# Patient Record
Sex: Male | Born: 1945 | Race: White | Hispanic: No | Marital: Married | Smoking: Never smoker
Health system: Southern US, Community
[De-identification: ages and names within clinical notes are randomized; demographics above are authoritative.]

## PROBLEM LIST (undated history)

## (undated) DIAGNOSIS — E876 Hypokalemia: Secondary | ICD-10-CM

## (undated) DIAGNOSIS — B079 Viral wart, unspecified: Secondary | ICD-10-CM

## (undated) DIAGNOSIS — G4730 Sleep apnea, unspecified: Secondary | ICD-10-CM

## (undated) DIAGNOSIS — E7800 Pure hypercholesterolemia, unspecified: Secondary | ICD-10-CM

## (undated) DIAGNOSIS — E669 Obesity, unspecified: Secondary | ICD-10-CM

## (undated) DIAGNOSIS — Z8619 Personal history of other infectious and parasitic diseases: Secondary | ICD-10-CM

## (undated) DIAGNOSIS — I82409 Acute embolism and thrombosis of unspecified deep veins of unspecified lower extremity: Secondary | ICD-10-CM

## (undated) DIAGNOSIS — M549 Dorsalgia, unspecified: Secondary | ICD-10-CM

## (undated) DIAGNOSIS — I10 Essential (primary) hypertension: Secondary | ICD-10-CM

## (undated) DIAGNOSIS — E785 Hyperlipidemia, unspecified: Secondary | ICD-10-CM

## (undated) HISTORY — DX: Viral wart, unspecified: B07.9

## (undated) HISTORY — PX: EYE SURGERY: SHX253

## (undated) HISTORY — DX: Acute embolism and thrombosis of unspecified deep veins of unspecified lower extremity: I82.409

## (undated) HISTORY — DX: Sleep apnea, unspecified: G47.30

## (undated) HISTORY — DX: Essential (primary) hypertension: I10

## (undated) HISTORY — DX: Obesity, unspecified: E66.9

## (undated) HISTORY — DX: Dorsalgia, unspecified: M54.9

## (undated) HISTORY — DX: Personal history of other infectious and parasitic diseases: Z86.19

## (undated) HISTORY — DX: Hyperlipidemia, unspecified: E78.5

## (undated) HISTORY — DX: Hypokalemia: E87.6

## (undated) HISTORY — DX: Pure hypercholesterolemia, unspecified: E78.00

---

## 2000-01-02 ENCOUNTER — Encounter: Admission: RE | Admit: 2000-01-02 | Discharge: 2000-01-02 | Payer: Self-pay | Admitting: Family Medicine

## 2000-03-29 ENCOUNTER — Ambulatory Visit (HOSPITAL_BASED_OUTPATIENT_CLINIC_OR_DEPARTMENT_OTHER): Admission: RE | Admit: 2000-03-29 | Discharge: 2000-03-29 | Payer: Self-pay | Admitting: *Deleted

## 2003-06-29 ENCOUNTER — Encounter (INDEPENDENT_AMBULATORY_CARE_PROVIDER_SITE_OTHER): Payer: Self-pay | Admitting: Cardiology

## 2003-06-29 ENCOUNTER — Ambulatory Visit (HOSPITAL_COMMUNITY): Admission: RE | Admit: 2003-06-29 | Discharge: 2003-06-29 | Payer: Self-pay | Admitting: Cardiology

## 2006-01-12 LAB — HM COLONOSCOPY

## 2006-02-06 ENCOUNTER — Ambulatory Visit: Payer: Self-pay | Admitting: Pulmonary Disease

## 2008-08-29 ENCOUNTER — Encounter: Admission: RE | Admit: 2008-08-29 | Discharge: 2008-09-20 | Payer: Self-pay | Admitting: Family Medicine

## 2009-01-18 ENCOUNTER — Emergency Department (HOSPITAL_COMMUNITY): Admission: EM | Admit: 2009-01-18 | Discharge: 2009-01-18 | Payer: Self-pay | Admitting: Emergency Medicine

## 2009-05-30 ENCOUNTER — Emergency Department (HOSPITAL_BASED_OUTPATIENT_CLINIC_OR_DEPARTMENT_OTHER): Admission: EM | Admit: 2009-05-30 | Discharge: 2009-05-30 | Payer: Self-pay | Admitting: Emergency Medicine

## 2010-08-17 LAB — COMPREHENSIVE METABOLIC PANEL
ALT: 30 U/L (ref 0–53)
AST: 30 U/L (ref 0–37)
Albumin: 4.5 g/dL (ref 3.5–5.2)
Alkaline Phosphatase: 66 U/L (ref 39–117)
BUN: 12 mg/dL (ref 6–23)
CO2: 27 mEq/L (ref 19–32)
Calcium: 9.8 mg/dL (ref 8.4–10.5)
Chloride: 104 mEq/L (ref 96–112)
Creatinine, Ser: 0.96 mg/dL (ref 0.4–1.5)
GFR calc Af Amer: >60 mL/min (ref >60)
GFR calc non Af Amer: >60 mL/min (ref >60)
Glucose, Bld: 95 mg/dL (ref 70–99)
Potassium: 3.6 mEq/L (ref 3.5–5.1)
Sodium: 141 mEq/L (ref 135–145)
Total Bilirubin: 0.7 mg/dL (ref 0.3–1.2)
Total Protein: 7.9 g/dL (ref 6.0–8.3)

## 2010-08-17 LAB — DIFFERENTIAL
Basophils Absolute: 0 10*3/uL (ref 0.0–0.1)
Basophils Relative: 1 % (ref 0–1)
Eosinophils Absolute: 0.3 10*3/uL (ref 0.0–0.7)
Eosinophils Relative: 4 % (ref 0–5)
Lymphocytes Relative: 26 % (ref 12–46)
Lymphs Abs: 1.8 10*3/uL (ref 0.7–4.0)
Monocytes Absolute: 0.7 10*3/uL (ref 0.1–1.0)
Monocytes Relative: 11 % (ref 3–12)
Neutro Abs: 4.2 10*3/uL (ref 1.7–7.7)
Neutrophils Relative %: 60 % (ref 43–77)

## 2010-08-17 LAB — CBC
HCT: 43.1 % (ref 39.0–52.0)
Hemoglobin: 14.6 g/dL (ref 13.0–17.0)
MCHC: 34 g/dL (ref 30.0–36.0)
MCV: 93.7 fL (ref 78.0–100.0)
Platelets: 224 10*3/uL (ref 150–400)
RBC: 4.6 MIL/uL (ref 4.22–5.81)
RDW: 13.7 % (ref 11.5–15.5)
WBC: 7 10*3/uL (ref 4.0–10.5)

## 2010-08-17 LAB — CARBOXYHEMOGLOBIN
Carboxyhemoglobin: 1 % (ref 0.5–1.5)
Methemoglobin: 0.4 % (ref 0.0–1.5)
O2 Saturation: 70.3 %
Total hemoglobin: 14.7 g/dL (ref 13.5–18.0)

## 2013-04-19 DIAGNOSIS — I10 Essential (primary) hypertension: Secondary | ICD-10-CM | POA: Insufficient documentation

## 2014-06-09 ENCOUNTER — Telehealth: Payer: Self-pay | Admitting: Pulmonary Disease

## 2014-06-09 NOTE — Telephone Encounter (Signed)
Pt needing new CPAP machine. Current machine seems to be functioning okay just very noisy.Former patient of Dr Gwenette Greet from 2007. Pt requested sooner appt than 06/28/14 appt that [facility] had available. Pt was okay with switching MD's. Scheduled with CDY 06/10/14 at 10:30 for sleep consult. Pt aware to arrive 15-16mns early for appt to fill out paperwork. Nothing further needed.

## 2014-06-10 ENCOUNTER — Ambulatory Visit (INDEPENDENT_AMBULATORY_CARE_PROVIDER_SITE_OTHER): Payer: Medicare Other | Admitting: Internal Medicine

## 2014-06-10 ENCOUNTER — Encounter: Payer: Self-pay | Admitting: Internal Medicine

## 2014-06-10 VITALS — BP 154/82 | HR 75 | Ht 69.0 in | Wt 235.2 lb

## 2014-06-10 DIAGNOSIS — G4733 Obstructive sleep apnea (adult) (pediatric): Secondary | ICD-10-CM

## 2014-06-10 NOTE — Progress Notes (Signed)
Pt needing new CPAP machine. Current machine seems to be functioning okay just very noisy.Former patient of Dr Gwenette Greet from 2007. Pt requested sooner appt than 06/28/14 appt that [facility] had available. Pt was okay with switching MD's. Scheduled with CDY 06/10/14 at 10:30 for sleep consult. Pt aware to arrive 15-36mns early for appt to fill out paperwork. Nothing further needed.  06/10/14- 89yoM FOLLOW FOR:  former patient of Dr. CGwenette Greet needs new CPAP equipment; Epworth Score: 3 He has been using this all night, every night at 12 cwp. No snore and good control of sleep quality.  We are seeking his diagnostic sleep study report. He had been using an online source for CPAP supplies but wants to switch back to [facility]. Bedtime around 9:30 PM, short sleep latency, waking once or twice before up at 7 AM. Weight stable. Nasal polypectomy years ago, no other ENT surgery. Medical problems with hypertension but no heart or lung disease. Never smoked.  Prior to Admission medications   Medication Sig Start Date End Date Taking? Authorizing Provider  aspirin 81 MG tablet Take 81 mg by mouth daily.   Yes Historical Provider, MD  hydrochlorothiazide (HYDRODIURIL) 25 MG tablet  04/13/14  Yes Historical Provider, MD  losartan (COZAAR) 50 MG tablet  04/13/14  Yes Historical Provider, MD  pravastatin (PRAVACHOL) 40 MG tablet  04/13/14  Yes Historical Provider, MD   Past Medical History  Diagnosis Date  . High blood pressure   . High cholesterol    No past surgical history on file. Family History  Problem Relation Age of Onset  . Stroke Father     from surgery  . Emphysema Father     smoker   History   Social History  . Marital Status: Married    Spouse Name: N/A    Number of Children: 1  . Years of Education: N/A   Occupational History  . Retired    Social History Main Topics  . Smoking status: Never Smoker   . Smokeless tobacco: Not on file  . Alcohol Use: 0.0 oz/week    0 Not specified per  week     Comment: 2-3 glasses of wine every night  . Drug Use: No  . Sexual Activity: Yes   Other Topics Concern  . Not on file   Social History Narrative  . No narrative on file   ROS-see HPI Constitutional:   No-   weight loss, night sweats, fevers, chills, fatigue, lassitude. HEENT:   No-  headaches, difficulty swallowing, tooth/dental problems, sore throat,       No-  sneezing, itching, ear ache, nasal congestion, post nasal drip,  CV:  No-   chest pain, orthopnea, PND, swelling in lower extremities, anasarca,                                  dizziness, palpitations Resp: No-   shortness of breath with exertion or at rest.              No-   productive cough,  No non-productive cough,  No- coughing up of blood.              No-   change in color of mucus.  No- wheezing.   Skin: No-   rash or lesions. GI:  No-   heartburn, indigestion, abdominal pain, nausea, vomiting, diarrhea,  change in bowel habits, loss of appetite GU: No-   dysuria, change in color of urine, no urgency or frequency.  No- flank pain. MS:  No-   joint pain or swelling.  No- decreased range of motion.  No- back pain. Neuro-     nothing unusual Psych:  No- change in mood or affect. No depression or anxiety.  No memory loss.  OBJ- Physical Exam General- Alert, Oriented, Affect-appropriate, Distress- none acute, overweight Skin- rash-none, lesions- none, excoriation- none Lymphadenopathy- none Head- atraumatic            Eyes- Gross vision intact, PERRLA, conjunctivae and secretions clear            Ears- Hearing, canals-normal            Nose- Clear, no-Septal dev, mucus, polyps, erosion, perforation             Throat- Mallampati IV , mucosa clear , drainage- none, tonsils- atrophic Neck- flexible , trachea midline, no stridor , thyroid nl, carotid no bruit Chest - symmetrical excursion , unlabored           Heart/CV- RRR , no murmur , no gallop  , no rub, nl s1 s2                            - JVD- none , edema- none, stasis changes- none, varices- none           Lung- clear to P&A, wheeze- none, cough- none , dullness-none, rub- none           Chest wall-  Abd- tender-no, distended-no, bowel sounds-present, HSM- no Br/ Gen/ Rectal- Not done, not indicated Extrem- cyanosis- none, clubbing, none, atrophy- none, strength- nl Neuro- grossly intact to observation

## 2014-06-10 NOTE — Patient Instructions (Addendum)
Order- DME [facility] replacement CPAP machine of choice, 12 cwp, mask of choice, humidifier, supplies   Dx OSA  Seek original diagnostic sleep study documentation

## 2014-06-10 NOTE — Assessment & Plan Note (Signed)
He has had excellent compliance and control with CPAP set at 12 using a nasal mask and he describes prevention of snoring and of daytime sleepiness. His old machine is wearing out. We reviewed basic sleep hygiene and CPAP use. It is appropriate to replace his CPAP machine. I don't think he needs a pressure change. Plan-reestablish with [facility] for replacement CPAP machine set at 12, mask of choice, humidifier and supplies.

## 2014-06-17 ENCOUNTER — Telehealth: Payer: Self-pay | Admitting: Internal Medicine

## 2014-06-17 NOTE — Telephone Encounter (Signed)
I was able to get his paper chart the next day after his OV and Melissa with [facility] took a copy of this with her.

## 2014-06-17 NOTE — Telephone Encounter (Signed)
Spoke with pt, he is aware of this.  Nothing further needed.

## 2014-06-17 NOTE — Telephone Encounter (Signed)
Spoke with pt, wants to make sure we have requested his archived sleep study and sent it to [facility].  I do not see documentation of this in his chart.  Katie please advise on the status of this.  Thanks!!

## 2014-07-01 ENCOUNTER — Encounter: Payer: Self-pay | Admitting: Internal Medicine

## 2014-08-09 ENCOUNTER — Encounter: Payer: Self-pay | Admitting: Internal Medicine

## 2014-08-09 ENCOUNTER — Ambulatory Visit: Payer: Medicare Other | Admitting: Internal Medicine

## 2014-08-09 VITALS — BP 126/80 | HR 74 | Ht 69.0 in | Wt 239.6 lb

## 2014-08-09 DIAGNOSIS — G4733 Obstructive sleep apnea (adult) (pediatric): Secondary | ICD-10-CM

## 2014-08-09 NOTE — Patient Instructions (Signed)
We can continue CPAP 12/ [facility]. If there are ongoing concerns with equipment you can discuss directly with the people at [facility].  Please call us as needed

## 2014-08-09 NOTE — Progress Notes (Signed)
Pt needing new CPAP machine. Current machine seems to be functioning okay just very noisy.Former patient of Dr Gwenette Greet from 2007. Pt requested sooner appt than 06/28/14 appt that [facility] had available. Pt was okay with switching MD's. Scheduled with CDY 06/10/14 at 10:30 for sleep consult. Pt aware to arrive 15-20mns early for appt to fill out paperwork. Nothing further needed.  06/10/14- 89yoM FOLLOW FOR:  former patient of Dr. CGwenette Greet needs new CPAP equipment; Epworth Score: 3 He has been using this all night, every night at 12 cwp. No snore and good control of sleep quality.  We are seeking his diagnostic sleep study report. He had been using an online source for CPAP supplies but wants to switch back to [facility]. Bedtime around 9:30 PM, short sleep latency, waking once or twice before up at 7 AM. Weight stable. Nasal polypectomy years ago, no other ENT surgery. Medical problems with hypertension but no heart or lung disease. Never smoked.  08/09/14- 89yoM FOLLOW FOR:  former patient of Dr. CGwenette Greet needs new CPAP equipment; Epworth Score: 3 FOLLOWS FOR: DME is [facility]; wears CPAP   / [facility] every night for about 8-9 hours. [facility] to last OV notes.   ROS-see HPI Constitutional:   No-   weight loss, night sweats, fevers, chills, fatigue, lassitude. HEENT:   No-  headaches, difficulty swallowing, tooth/dental problems, sore throat,       No-  sneezing, itching, ear ache, nasal congestion, post nasal drip,  CV:  No-   chest pain, orthopnea, PND, swelling in lower extremities, anasarca,                                  dizziness, palpitations Resp: No-   shortness of breath with exertion or at rest.              No-   productive cough,  No non-productive cough,  No- coughing up of blood.              No-   change in color of mucus.  No- wheezing.   Skin: No-   rash or lesions. GI:  No-   heartburn, indigestion, abdominal pain, nausea, vomiting, diarrhea,                 change  in bowel habits, loss of appetite GU: No-   dysuria, change in color of urine, no urgency or frequency.  No- flank pain. MS:  No-   joint pain or swelling.  No- decreased range of motion.  No- back pain. Neuro-     nothing unusual Psych:  No- change in mood or affect. No depression or anxiety.  No memory loss.  OBJ- Physical Exam General- Alert, Oriented, Affect-appropriate, Distress- none acute, overweight Skin- rash-none, lesions- none, excoriation- none Lymphadenopathy- none Head- atraumatic            Eyes- Gross vision intact, PERRLA, conjunctivae and secretions clear            Ears- Hearing, canals-normal            Nose- Clear, no-Septal dev, mucus, polyps, erosion, perforation             Throat- Mallampati IV , mucosa clear , drainage- none, tonsils- atrophic Neck- flexible , trachea midline, no stridor , thyroid nl, carotid no bruit Chest - symmetrical excursion , unlabored  Heart/CV- RRR , no murmur , no gallop  , no rub, nl s1 s2                           - JVD- none , edema- none, stasis changes- none, varices- none           Lung- clear to P&A, wheeze- none, cough- none , dullness-none, rub- none           Chest wall-  Abd- tender-no, distended-no, bowel sounds-present, HSM- no Br/ Gen/ Rectal- Not done, not indicated Extrem- cyanosis- none, clubbing, none, atrophy- none, strength- nl Neuro- grossly intact to observation

## 2014-09-06 ENCOUNTER — Encounter: Payer: Self-pay | Admitting: Internal Medicine

## 2014-12-21 ENCOUNTER — Emergency Department (HOSPITAL_BASED_OUTPATIENT_CLINIC_OR_DEPARTMENT_OTHER): Payer: Medicare Other

## 2014-12-21 ENCOUNTER — Emergency Department (HOSPITAL_BASED_OUTPATIENT_CLINIC_OR_DEPARTMENT_OTHER)
Admission: EM | Admit: 2014-12-21 | Discharge: 2014-12-21 | Disposition: A | Discharge status: Home / Self Care | Payer: Medicare Other | Attending: Emergency Medicine | Admitting: Emergency Medicine

## 2014-12-21 ENCOUNTER — Ambulatory Visit (HOSPITAL_BASED_OUTPATIENT_CLINIC_OR_DEPARTMENT_OTHER)
Admit: 2014-12-21 | Discharge: 2014-12-21 | Disposition: A | Discharge status: Home / Self Care | Payer: Medicare Other | Attending: Emergency Medicine | Admitting: Emergency Medicine

## 2014-12-21 ENCOUNTER — Encounter (HOSPITAL_BASED_OUTPATIENT_CLINIC_OR_DEPARTMENT_OTHER): Payer: Self-pay

## 2014-12-21 ENCOUNTER — Encounter (HOSPITAL_BASED_OUTPATIENT_CLINIC_OR_DEPARTMENT_OTHER): Payer: Self-pay | Admitting: *Deleted

## 2014-12-21 DIAGNOSIS — Z7982 Long term (current) use of aspirin: Secondary | ICD-10-CM | POA: Diagnosis not present

## 2014-12-21 DIAGNOSIS — E780 Pure hypercholesterolemia: Secondary | ICD-10-CM | POA: Insufficient documentation

## 2014-12-21 DIAGNOSIS — M25561 Pain in right knee: Secondary | ICD-10-CM | POA: Insufficient documentation

## 2014-12-21 DIAGNOSIS — Z79899 Other long term (current) drug therapy: Secondary | ICD-10-CM | POA: Insufficient documentation

## 2014-12-21 DIAGNOSIS — M25569 Pain in unspecified knee: Secondary | ICD-10-CM

## 2014-12-21 DIAGNOSIS — I82401 Acute embolism and thrombosis of unspecified deep veins of right lower extremity: Secondary | ICD-10-CM

## 2014-12-21 LAB — CBC WITH DIFFERENTIAL/PLATELET
BASOS ABS: 0 10*3/uL (ref 0.0–0.1)
Basophils Relative: 0 % (ref 0–1)
EOS PCT: 2 % (ref 0–5)
Eosinophils Absolute: 0.2 10*3/uL (ref 0.0–0.7)
HEMATOCRIT: 43.5 % (ref 39.0–52.0)
Hemoglobin: 14.1 g/dL (ref 13.0–17.0)
LYMPHS PCT: 16 % (ref 12–46)
Lymphs Abs: 1.6 10*3/uL (ref 0.7–4.0)
MCH: 31.4 pg (ref 26.0–34.0)
MCHC: 32.4 g/dL (ref 30.0–36.0)
MCV: 96.9 fL (ref 78.0–100.0)
Monocytes Absolute: 1.2 10*3/uL — ABNORMAL HIGH (ref 0.1–1.0)
Monocytes Relative: 12 % (ref 3–12)
NEUTROS ABS: 7.3 10*3/uL (ref 1.7–7.7)
Neutrophils Relative %: 70 % (ref 43–77)
Platelets: 227 10*3/uL (ref 150–400)
RBC: 4.49 MIL/uL (ref 4.22–5.81)
RDW: 12.9 % (ref 11.5–15.5)
WBC: 10.3 10*3/uL (ref 4.0–10.5)

## 2014-12-21 LAB — BASIC METABOLIC PANEL
Anion gap: 10 (ref 5–15)
BUN: 13 mg/dL (ref 6–20)
CHLORIDE: 101 mmol/L (ref 101–111)
CO2: 28 mmol/L (ref 22–32)
CREATININE: 0.85 mg/dL (ref 0.61–1.24)
Calcium: 9.4 mg/dL (ref 8.9–10.3)
GFR calc Af Amer: >60 mL/min (ref >60)
GFR calc non Af Amer: >60 mL/min (ref >60)
Glucose, Bld: 131 mg/dL — ABNORMAL HIGH (ref 65–99)
POTASSIUM: 3.2 mmol/L — AB (ref 3.5–5.1)
Sodium: 139 mmol/L (ref 135–145)

## 2014-12-21 LAB — D-DIMER, QUANTITATIVE: D-Dimer, Quant: 0.63 ug/mL-FEU — ABNORMAL HIGH (ref 0.00–0.48)

## 2014-12-21 MED ORDER — OXYCODONE-ACETAMINOPHEN 5-325 MG PO TABS: 1.0000 | ORAL_TABLET | Freq: Once | ORAL | Status: DC

## 2014-12-21 MED ORDER — TRAMADOL HCL 50 MG PO TABS
50.0000 mg | ORAL_TABLET | Freq: Once | ORAL | Status: AC
  Administered 2014-12-21: 50 mg via ORAL
  Filled 2014-12-21: qty 1

## 2014-12-21 MED ORDER — XARELTO VTE STARTER PACK 15 & 20 MG PO TBPK: 15.0000 mg | ORAL_TABLET | ORAL | Status: DC

## 2014-12-21 MED ORDER — TRAMADOL HCL 50 MG PO TABS: 50.0000 mg | ORAL_TABLET | Freq: Two times a day (BID) | ORAL | Status: DC | PRN

## 2014-12-21 NOTE — ED Provider Notes (Signed)
CSN: [id number]     Arrival date & time 12/21/14  0821 History   First MD Initiated Contact with Patient 12/21/14 [phone]     Chief Complaint - right knee pain    Patient is a 69 y.o. male presenting with knee pain. The history is provided by the patient and the spouse.  Knee Pain Location:  Knee Injury: no   Knee location:  R knee Pain details:    Quality:  Aching   Radiates to: thigh.   Severity:  Moderate   Onset quality:  Gradual   Duration:  2 days   Timing:  Constant   Progression:  Worsening Chronicity:  New Relieved by:  Rest Worsened by:  Flexion (walking) Associated symptoms: no back pain and no fever   pt reports right knee pain for 2 days No trauma but reports increased walking upstairs (just got back from [place], 3 hrs away) No h/o VTE No cp/sob No calf pain/edema  Past Medical History  Diagnosis Date  . High blood pressure   . High cholesterol    History reviewed. No pertinent past surgical history. Family History  Problem Relation Age of Onset  . Stroke Father     from surgery  . Emphysema Father     smoker   Social History  Substance Use Topics  . Smoking status: Never Smoker   . Smokeless tobacco: None  . Alcohol Use: 0.0 oz/week    0 Standard drinks or equivalent per week     Comment: 2-3 glasses of wine every night    Review of Systems  Constitutional: Negative for fever.  Respiratory: Negative for shortness of breath.   Cardiovascular: Negative for chest pain.  Musculoskeletal: Positive for arthralgias. Negative for back pain.      Allergies  Codeine  Home Medications   Prior to Admission medications   Medication Sig Start Date End Date Taking? Authorizing Provider  aspirin 81 MG tablet Take 81 mg by mouth daily.   Yes Historical Provider, MD  hydrochlorothiazide (HYDRODIURIL) 25 MG tablet  04/13/14  Yes Historical Provider, MD  losartan (COZAAR) 50 MG tablet  04/13/14  Yes Historical Provider, MD  Multiple Vitamin  (MULTIVITAMIN) tablet Take 1 tablet by mouth daily.   Yes Historical Provider, MD  pravastatin (PRAVACHOL) 40 MG tablet  04/13/14  Yes Historical Provider, MD  vitamin C (ASCORBIC ACID) 500 MG tablet Take 500 mg by mouth daily.   Yes Historical Provider, MD   BP 156/81 mmHg  Pulse 83  Temp(Src) 98.4 F (36.9 C) (Oral)  Resp 18  Ht _0  (1.753 m)  Wt 224 lb (101.606 kg)  BMI 33.06 kg/m2  SpO2 97% Physical Exam CONSTITUTIONAL: Well developed/well nourished HEAD: Normocephalic/atraumatic EYES: EOMI/PERRL ENMT: Mucous membranes moist NECK: supple no meningeal signs SPINE/BACK:entire spine nontender CV: S1/S2 noted, no murmurs/rubs/gallops noted LUNGS: Lungs are clear to auscultation bilaterally, no apparent distress ABDOMEN: soft, nontender NEURO: Pt is awake/alert/appropriate, moves all extremitiesx4.  No facial droop.   EXTREMITIES: pulses normal/equal, full ROM. Tenderness with palpation/ROM of right medial knee, no erythema, ?small effusion.  He also has right thigh tenderness.  No erythema/edema.  No calf tenderness.   SKIN: warm, color normal PSYCH: no abnormalities of mood noted, alert and oriented to situation  ED Course  Procedures  8:52 AM Pt with knee/thigh pain Recent travel, could be DVT Will add on d-dimer and also right knee xray 9:56 AM Clinically difficult to aspirate knee Doubt septic joint Will place on  crutches and refer to sports med Will return later today for DVT study  Labs Review Labs Reviewed  D-DIMER, QUANTITATIVE (NOT AT [facility]) - Abnormal; Notable for the following:    D-Dimer, Quant 0.63 (*)    All other components within normal limits    Imaging Review Dg Knee Complete 4 Views Right  12/21/2014   CLINICAL DATA:  Knee pain  EXAM: RIGHT KNEE - COMPLETE 4+ VIEW  COMPARISON:  None.  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 Large subchondral cyst in the lateral  tibial plateau. Medial joint space is normal. Mild patellofemoral joint space narrowing. Moderate joint effusion  Negative for fracture.  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 No fracture.   Electronically Signed   By: Franchot Gallo M.D.   On: 12/21/2014 09:12     EKG Interpretation None      MDM   Final diagnoses:  Knee pain  Right knee pain    Nursing notes including past medical history and social history reviewed and considered in documentation xrays/imaging reviewed by myself and considered during evaluation Labs/vital reviewed myself and considered during evaluation     Ripley Fraise, MD 12/21/14 [phone]

## 2014-12-21 NOTE — ED Notes (Signed)
Positive DVT right LE-seen earlier

## 2014-12-21 NOTE — Discharge Instructions (Signed)
Arthralgia Your caregiver has diagnosed you as suffering from an arthralgia. Arthralgia means there is pain in a joint. This can come from many reasons including:  Bruising the joint which causes soreness (inflammation) in the joint.  Wear and tear on the joints which occur as we grow older (osteoarthritis).  Overusing the joint.  Various forms of arthritis.  Infections of the joint. Regardless of the cause of pain in your joint, most of these different pains respond to anti-inflammatory drugs and rest. The exception to this is when a joint is infected, and these cases are treated with antibiotics, if it is a bacterial infection. HOME CARE INSTRUCTIONS   Rest the injured area for as long as directed by your caregiver. Then slowly start using the joint as directed by your caregiver and as the pain allows. Crutches as directed may be useful if the ankles, knees or hips are involved. If the knee was splinted or casted, continue use and care as directed. If an stretchy or elastic wrapping bandage has been applied today, it should be removed and re-applied every 3 to 4 hours. It should not be applied tightly, but firmly enough to keep swelling down. Watch toes and feet for swelling, bluish discoloration, coldness, numbness or excessive pain. If any of these problems (symptoms) occur, remove the ace bandage and re-apply more loosely. If these symptoms persist, contact your caregiver or return to this location.  For the first 24 hours, keep the injured extremity elevated on pillows while lying down.  Apply ice for 15-20 minutes to the sore joint every couple hours while awake for the first half day. Then 03-04 times per day for the first 48 hours. Put the ice in a plastic bag and place a towel between the bag of ice and your skin.  Wear any splinting, casting, elastic bandage applications, or slings as instructed.  Only take over-the-counter or prescription medicines for pain, discomfort, or fever as  directed by your caregiver. Do not use aspirin immediately after the injury unless instructed by your physician. Aspirin can cause increased bleeding and bruising of the tissues.  If you were given crutches, continue to use them as instructed and do not resume weight bearing on the sore joint until instructed. Persistent pain and inability to use the sore joint as directed for more than 2 to 3 days are warning signs indicating that you should see a caregiver for a follow-up visit as soon as possible. Initially, a hairline fracture (break in bone) may not be evident on X-rays. Persistent pain and swelling indicate that further evaluation, non-weight bearing or use of the joint (use of crutches or slings as instructed), or further X-rays are indicated. X-rays may sometimes not show a small fracture until a week or 10 days later. Make a follow-up appointment with your own caregiver or one to whom we have referred you. A radiologist (specialist in reading X-rays) may read your X-rays. Make sure you know how you are to obtain your X-ray results. Do not assume everything is normal if you do not hear from [name]. SEEK MEDICAL CARE IF: Bruising, swelling, or pain increases. SEEK IMMEDIATE MEDICAL CARE IF:   Your fingers or toes are numb or blue.  The pain is not responding to medications and continues to stay the same or get worse.  The pain in your joint becomes severe.  You develop a fever over 102 F (38.9 C).  It becomes impossible to move or use the joint. MAKE SURE YOU:  Understand these instructions.  Will watch your condition.  Will get help right away if you are not doing well or get worse. Document Released: 04/29/2005 Document Revised: 07/22/2011 Document Reviewed: 12/16/2007 [facility] Patient Information 2015 [place], [place]. This information is not intended to replace advice given to you by your health care provider. Make sure you discuss any questions you have with your health care  provider.

## 2014-12-21 NOTE — ED Provider Notes (Signed)
CSN: [id number]     Arrival date & time 12/21/14  1443 History   First MD Initiated Contact with Patient 12/21/14 1531     Chief Complaint  Patient presents with  . DVT     (Consider location/radiation/quality/duration/timing/severity/associated sxs/prior Treatment) Patient is a 69 y.o. male presenting with knee pain.  Knee Pain Location:  Knee Time since incident:  2 days Injury: no   Knee location:  R knee Pain details:    Quality:  Aching   Radiates to: thigh.   Severity:  Moderate   Onset quality:  Gradual   Duration:  2 days   Timing:  Constant   Progression:  Worsening Prior injury to area:  No Relieved by:  Rest Worsened by:  Flexion and extension Associated symptoms: no swelling   Associated symptoms comment:  No chest pain, and no dyspnea Risk factors comment:  Recent long car ride   Past Medical History  Diagnosis Date  . High blood pressure   . High cholesterol    History reviewed. No pertinent past surgical history. Family History  Problem Relation Age of Onset  . Stroke Father     from surgery  . Emphysema Father     smoker   Social History  Substance Use Topics  . Smoking status: Never Smoker   . Smokeless tobacco: None  . Alcohol Use: 0.0 oz/week    0 Standard drinks or equivalent per week     Comment: 2-3 glasses of wine every night    Review of Systems  All other systems reviewed and are negative.     Allergies  Codeine  Home Medications   Prior to Admission medications   Medication Sig Start Date End Date Taking? Authorizing Provider  aspirin 81 MG tablet Take 81 mg by mouth daily.    Historical Provider, MD  hydrochlorothiazide (HYDRODIURIL) 25 MG tablet  04/13/14   Historical Provider, MD  losartan (COZAAR) 50 MG tablet  04/13/14   Historical Provider, MD  Multiple Vitamin (MULTIVITAMIN) tablet Take 1 tablet by mouth daily.    Historical Provider, MD  pravastatin (PRAVACHOL) 40 MG tablet  04/13/14   Historical Provider, MD   traMADol (ULTRAM) 50 MG tablet Take 1 tablet (50 mg total) by mouth every 12 (twelve) hours as needed. 12/21/14   Ripley Fraise, MD  vitamin C (ASCORBIC ACID) 500 MG tablet Take 500 mg by mouth daily.    Historical Provider, MD  XARELTO STARTER PACK 15 & 20 MG TBPK Take 15-20 mg by mouth as directed. Take as directed on package: Start with one 54m tablet by mouth twice a day with food. On Day 22, switch to one 249mtablet once a day with food. 12/21/14   MaDebby FreibergMD   BP 145/73 mmHg  Pulse 84  Temp(Src) 99.1 F (37.3 C) (Oral)  Resp 18  Ht _0  (1.727 m)  Wt 224 lb (101.606 kg)  BMI 34.07 kg/m2  SpO2 95% Physical Exam  Constitutional: He is oriented to person, place, and time. He appears well-developed and well-nourished.  HENT:  Head: Normocephalic and atraumatic.  Eyes: Conjunctivae and EOM are normal.  Neck: Normal range of motion. Neck supple.  Cardiovascular: Normal rate, regular rhythm and normal heart sounds.   Pulmonary/Chest: Effort normal and breath sounds normal. No respiratory distress.  Abdominal: He exhibits no distension. There is no tenderness. There is no rebound and no guarding.  Musculoskeletal: Normal range of motion.       Right knee:  He exhibits normal range of motion and no swelling.  Neurological: He is alert and oriented to person, place, and time.  Skin: Skin is warm and dry.  Vitals reviewed.   ED Course  Procedures (including critical care time) Labs Review Labs Reviewed  CBC WITH DIFFERENTIAL/PLATELET - Abnormal; Notable for the following:    Monocytes Absolute 1.2 (*)    All other components within normal limits  BASIC METABOLIC PANEL - Abnormal; Notable for the following:    Potassium 3.2 (*)    Glucose, Bld 131 (*)    All other components within normal limits    Imaging Review US Venous Img Lower Unilateral Right  12/21/2014   CLINICAL DATA:  Right calf pain  EXAM: RIGHT LOWER EXTREMITY VENOUS DUPLEX ULTRASOUND  TECHNIQUE: Doppler  venous assessment of the right lower extremity deep venous system was performed, including characterization of spectral flow, compressibility, and phasicity.  COMPARISON:  None.  FINDINGS: There is complete compressibility of the right common femoral, femoral, and popliteal veins. Doppler analysis demonstrates respiratory phasicity and augmentation of flow upon calf compression.  There is noncompressible thrombus within the right gastrocnemius vein.  IMPRESSION: There is no evidence of DVT in the femoral or popliteal veins. The study is positive for DVT in the calf within the right gastrocnemius vein.   Electronically Signed   By: Marybelle Killings M.D.   On: 12/21/2014 14:21   Dg Knee Complete 4 Views Right  12/21/2014   CLINICAL DATA:  Knee pain  EXAM: RIGHT KNEE - COMPLETE 4+ VIEW  COMPARISON:  None.  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 Large subchondral cyst in the lateral tibial plateau. Medial joint space is normal. Mild patellofemoral joint space narrowing. Moderate joint effusion  Negative for fracture.  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 No fracture.   Electronically Signed   By: Franchot Gallo M.D.   On: 12/21/2014 09:12     EKG Interpretation None      MDM   Final diagnoses:  DVT (deep venous thrombosis), right    69 y.o. male with pertinent PMH of recent visit for R knee pain with DVT study which demonstrated R gastroc DVT presents for discussion of results.  He was informed prior to my care.  Checked cr which is normal.  He elected xarelto after discussion of risks, including inability to reverse.  DC home with starter pack.    I have reviewed all laboratory and imaging studies if ordered as above  1. DVT (deep venous thrombosis), right         Debby Freiberg, MD 12/21/14 [phone]

## 2014-12-21 NOTE — ED Notes (Signed)
Returns secondary to clot noted in rt leg.

## 2014-12-21 NOTE — ED Notes (Signed)
C/o  Right knee pain since Monday since going to beach and walking a lot of stairs. Swelling noted to medial side of right leg.

## 2014-12-21 NOTE — Discharge Instructions (Signed)
Deep Vein Thrombosis A deep vein thrombosis (DVT) is a blood clot that develops in the deep, larger veins of the leg, arm, or pelvis. These are more dangerous than clots that might form in veins near the surface of the body. A DVT can lead to serious and even life-threatening complications if the clot breaks off and travels in the bloodstream to the lungs.  A DVT can damage the valves in your leg veins so that instead of flowing upward, the blood pools in the lower leg. This is called post-thrombotic syndrome, and it can result in pain, swelling, discoloration, and sores on the leg. CAUSES Usually, several things contribute to the formation of blood clots. Contributing factors include:  The flow of blood slows down.  The inside of the vein is damaged in some way.  You have a condition that makes blood clot more easily. RISK FACTORS Some people are more likely than others to develop blood clots. Risk factors include:   Smoking.  Being overweight (obese).  Sitting or lying still for a long time. This includes long-distance travel, paralysis, or recovery from an illness or surgery. Other factors that increase risk are:   Older age, especially over 23 years of age.  Having a family history of blood clots or if you have already had a blot clot.  Having major or lengthy surgery. This is especially true for surgery on the hip, knee, or belly (abdomen). Hip surgery is particularly high risk.  Having a long, thin tube (catheter) placed inside a vein during a medical procedure.  Breaking a hip or leg.  Having cancer or cancer treatment.  Pregnancy and childbirth.  Hormone changes make the blood clot more easily during pregnancy.  The fetus puts pressure on the veins of the pelvis.  There is a risk of injury to veins during delivery or a caesarean delivery. The risk is highest just after childbirth.  Medicines containing the male hormone estrogen. This includes birth control pills and  hormone replacement therapy.  Other circulation or heart problems.  SIGNS AND SYMPTOMS When a clot forms, it can either partially or totally block the blood flow in that vein. Symptoms of a DVT can include:  Swelling of the leg or arm, especially if one side is much worse.  Warmth and redness of the leg or arm, especially if one side is much worse.  Pain in an arm or leg. If the clot is in the leg, symptoms may be more noticeable or worse when standing or walking. The symptoms of a DVT that has traveled to the lungs (pulmonary embolism, PE) usually start suddenly and include:  Shortness of breath.  Coughing.  Coughing up blood or blood-tinged mucus.  Chest pain. The chest pain is often worse with deep breaths.  Rapid heartbeat. Anyone with these symptoms should get emergency medical treatment right away. Do not wait to see if the symptoms will go away. Call your local emergency services (911 in the [place] if you have these symptoms. Do not drive yourself to the hospital. DIAGNOSIS If a DVT is suspected, your health care provider will take a full medical history and perform a physical exam. Tests that also may be required include:  Blood tests, including studies of the clotting properties of the blood.  Ultrasound to see if you have clots in your legs or lungs.  X-rays to show the flow of blood when dye is injected into the veins (venogram).  Studies of your lungs if you have any  chest symptoms. PREVENTION  Exercise the legs regularly. Take a brisk 30-minute walk every day.  Maintain a weight that is appropriate for your height.  Avoid sitting or lying in bed for long periods of time without moving your legs.  Women, particularly those over the age of 18 years, should consider the risks and benefits of taking estrogen medicines, including birth control pills.  Do not smoke, especially if you take estrogen medicines.  Long-distance travel can increase your risk of DVT. You  should exercise your legs by walking or pumping the muscles every hour.  Many of the risk factors above relate to situations that exist with hospitalization, either for illness, injury, or elective surgery. Prevention may include medical and nonmedical measures.  Your health care provider will assess you for the need for venous thromboembolism prevention when you are admitted to the hospital. If you are having surgery, your surgeon will assess you the day of or day after surgery. TREATMENT Once identified, a DVT can be treated. It can also be prevented in some circumstances. Once you have had a DVT, you may be at increased risk for a DVT in the future. The most common treatment for DVT is blood-thinning (anticoagulant) medicine, which reduces the blood's tendency to clot. Anticoagulants can stop new blood clots from forming and stop old clots from growing. They cannot dissolve existing clots. Your body does this by itself over time. Anticoagulants can be given by mouth, through an IV tube, or by injection. Your health care provider will determine the best program for you. Other medicines or treatments that may be used are:  Heparin or related medicines (low molecular weight heparin) are often the first treatment for a blood clot. They act quickly. However, they cannot be taken orally and must be given either in shot form or by IV tube.  Heparin can cause a fall in a component of blood that stops bleeding and forms blood clots (platelets). You will be monitored with blood tests to be sure this does not occur.  Warfarin is an anticoagulant that can be swallowed. It takes a few days to start working, so usually heparin or related medicines are used in combination. Once warfarin is working, heparin is usually stopped.  Factor Xa inhibitor medicines, such as rivaroxaban and apixaban, also reduce blood clotting. These medicines are taken orally and can often be used without heparin or related  medicines.  Less commonly, clot dissolving drugs (thrombolytics) are used to dissolve a DVT. They carry a high risk of bleeding, so they are used mainly in severe cases where your life or a part of your body is threatened.  Very rarely, a blood clot in the leg needs to be removed surgically.  If you are unable to take anticoagulants, your health care provider may arrange for you to have a filter placed in a main vein in your abdomen. This filter prevents clots from traveling to your lungs. HOME CARE INSTRUCTIONS  Take all medicines as directed by your health care provider.  Learn as much as you can about DVT.  Wear a medical alert bracelet or carry a medical alert card.  Ask your health care provider how soon you can go back to normal activities. It is important to stay active to prevent blood clots. If you are on anticoagulant medicine, avoid contact sports.  It is very important to exercise. This is especially important while traveling, sitting, or standing for long periods of time. Exercise your legs by walking or by  tightening and relaxing your leg muscles regularly. Take frequent walks.  You may need to wear compression stockings. These are tight elastic stockings that apply pressure to the lower legs. This pressure can help keep the blood in the legs from clotting. Taking Warfarin Warfarin is a daily medicine that is taken by mouth. Your health care provider will advise you on the length of treatment (usually 3-6 months, sometimes lifelong). If you take warfarin:  Understand how to take warfarin and foods that can affect how warfarin works in Veterinary surgeon.  Too much and too little warfarin are both dangerous. Too much warfarin increases the risk of bleeding. Too little warfarin continues to allow the risk for blood clots. Warfarin and Regular Blood Testing While taking warfarin, you will need to have regular blood tests to measure your blood clotting time. These blood tests usually  include both the prothrombin time (PT) and international normalized ratio (INR) tests. The PT and INR results allow your health care provider to adjust your dose of warfarin. It is very important that you have your PT and INR tested as often as directed by your health care provider.  Warfarin and Your Diet Avoid major changes in your diet, or notify your health care provider before changing your diet. Arrange a visit with a registered dietitian to answer your questions. Many foods, especially foods high in vitamin K, can interfere with warfarin and affect the PT and INR results. You should eat a consistent amount of foods high in vitamin K. Foods high in vitamin K include:   Spinach, kale, broccoli, cabbage, collard and turnip greens, Brussels sprouts, peas, cauliflower, seaweed, and parsley.  Beef and pork liver.  Green tea.  Soybean oil. Warfarin with Other Medicines Many medicines can interfere with warfarin and affect the PT and INR results. You must:  Tell your health care provider about any and all medicines, vitamins, and supplements you take, including aspirin and other over-the-counter anti-inflammatory medicines. Be especially cautious with aspirin and anti-inflammatory medicines. Ask your health care provider before taking these.  Do not take or discontinue any prescribed or over-the-counter medicine except on the advice of your health care provider or pharmacist. Warfarin Side Effects Warfarin can have side effects, such as easy bruising and difficulty stopping bleeding. Ask your health care provider or pharmacist about other side effects of warfarin. You will need to:  Hold pressure over cuts for longer than usual.  Notify your dentist and other health care providers that you are taking warfarin before you undergo any procedures where bleeding may occur. Warfarin with Alcohol and Tobacco   Drinking alcohol frequently can increase the effect of warfarin, leading to excess  bleeding. It is best to avoid alcoholic drinks or to consume only very small amounts while taking warfarin. Notify your health care provider if you change your alcohol intake.   Do not use any tobacco products including cigarettes, chewing tobacco, or electronic cigarettes. If you smoke, quit. Ask your health care provider for help with quitting smoking. Alternative Medicines to Warfarin: Factor Xa Inhibitor Medicines  These blood-thinning medicines are taken by mouth, usually for several weeks or longer. It is important to take the medicine every single day at the same time each day.  There are no regular blood tests required when using these medicines.  There are fewer food and drug interactions than with warfarin.  The side effects of this class of medicine are similar to those of warfarin, including excessive bruising or bleeding. Ask your  health care provider or pharmacist about other potential side effects. SEEK MEDICAL CARE IF:  You notice a rapid heartbeat.  You feel weaker or more tired than usual.  You feel faint.  You notice increased bruising.  You feel your symptoms are not getting better in the time expected.  You believe you are having side effects of medicine. SEEK IMMEDIATE MEDICAL CARE IF:  You have chest pain.  You have trouble breathing.  You have new or increased swelling or pain in one leg.  You cough up blood.  You notice blood in vomit, in a bowel movement, or in urine. MAKE SURE YOU:  Understand these instructions.  Will watch your condition.  Will get help right away if you are not doing well or get worse. Document Released: 04/29/2005 Document Revised: 09/13/2013 Document Reviewed: 01/04/2013 [facility] Patient Information 2015 [place], [place]. This information is not intended to replace advice given to you by your health care provider. Make sure you discuss any questions you have with your health care provider.

## 2015-01-13 ENCOUNTER — Encounter: Payer: Self-pay | Admitting: Family Medicine

## 2015-01-13 ENCOUNTER — Ambulatory Visit (INDEPENDENT_AMBULATORY_CARE_PROVIDER_SITE_OTHER): Payer: Medicare Other | Admitting: Family Medicine

## 2015-01-13 VITALS — BP 132/78 | HR 83 | Temp 98.4°F | Ht 68.5 in | Wt 227.1 lb

## 2015-01-13 DIAGNOSIS — E876 Hypokalemia: Secondary | ICD-10-CM | POA: Diagnosis not present

## 2015-01-13 DIAGNOSIS — E7800 Pure hypercholesterolemia, unspecified: Secondary | ICD-10-CM | POA: Insufficient documentation

## 2015-01-13 DIAGNOSIS — Z8619 Personal history of other infectious and parasitic diseases: Secondary | ICD-10-CM

## 2015-01-13 DIAGNOSIS — E785 Hyperlipidemia, unspecified: Secondary | ICD-10-CM | POA: Diagnosis not present

## 2015-01-13 DIAGNOSIS — I82401 Acute embolism and thrombosis of unspecified deep veins of right lower extremity: Secondary | ICD-10-CM

## 2015-01-13 DIAGNOSIS — E669 Obesity, unspecified: Secondary | ICD-10-CM | POA: Insufficient documentation

## 2015-01-13 DIAGNOSIS — I10 Essential (primary) hypertension: Secondary | ICD-10-CM

## 2015-01-13 DIAGNOSIS — R739 Hyperglycemia, unspecified: Secondary | ICD-10-CM

## 2015-01-13 DIAGNOSIS — L578 Other skin changes due to chronic exposure to nonionizing radiation: Secondary | ICD-10-CM

## 2015-01-13 DIAGNOSIS — G4733 Obstructive sleep apnea (adult) (pediatric): Secondary | ICD-10-CM

## 2015-01-13 DIAGNOSIS — I82409 Acute embolism and thrombosis of unspecified deep veins of unspecified lower extremity: Secondary | ICD-10-CM

## 2015-01-13 HISTORY — DX: Acute embolism and thrombosis of unspecified deep veins of unspecified lower extremity: I82.409

## 2015-01-13 HISTORY — DX: Personal history of other infectious and parasitic diseases: Z86.19

## 2015-01-13 HISTORY — DX: Hypokalemia: E87.6

## 2015-01-13 HISTORY — DX: Hyperlipidemia, unspecified: E78.5

## 2015-01-13 LAB — COMPREHENSIVE METABOLIC PANEL
ALBUMIN: 4.1 g/dL (ref 3.6–5.1)
ALT: 28 U/L (ref 9–46)
AST: 21 U/L (ref 10–35)
Alkaline Phosphatase: 64 U/L (ref 40–115)
BUN: 14 mg/dL (ref 7–25)
CHLORIDE: 102 mmol/L (ref 98–110)
CO2: 27 mmol/L (ref 20–31)
Calcium: 9.3 mg/dL (ref 8.6–10.3)
Creat: 0.76 mg/dL (ref 0.70–1.18)
Glucose, Bld: 129 mg/dL — ABNORMAL HIGH (ref 65–99)
Potassium: 3.4 mmol/L — ABNORMAL LOW (ref 3.5–5.3)
Sodium: 139 mmol/L (ref 135–146)
Total Bilirubin: 0.5 mg/dL (ref 0.2–1.2)
Total Protein: 7.1 g/dL (ref 6.1–8.1)

## 2015-01-13 LAB — CBC
HCT: 40.8 % (ref 39.0–52.0)
Hemoglobin: 14 g/dL (ref 13.0–17.0)
MCH: 31.9 pg (ref 26.0–34.0)
MCHC: 34.3 g/dL (ref 30.0–36.0)
MCV: 92.9 fL (ref 78.0–100.0)
MPV: 9.9 fL (ref 8.6–12.4)
PLATELETS: 243 10*3/uL (ref 150–400)
RBC: 4.39 MIL/uL (ref 4.22–5.81)
RDW: 13.6 % (ref 11.5–15.5)
WBC: 7.4 10*3/uL (ref 4.0–10.5)

## 2015-01-13 LAB — HEMOGLOBIN A1C
Hgb A1c MFr Bld: 5.8 % — ABNORMAL HIGH (ref <5.7)
Mean Plasma Glucose: 120 mg/dL — ABNORMAL HIGH (ref <117)

## 2015-01-13 LAB — LIPID PANEL
Cholesterol: 167 mg/dL (ref 125–200)
HDL: 46 mg/dL (ref >=40)
LDL CALC: 94 mg/dL (ref <130)
TRIGLYCERIDES: 134 mg/dL (ref <150)
Total CHOL/HDL Ratio: 3.6 Ratio (ref <=5.0)
VLDL: 27 mg/dL (ref <30)

## 2015-01-13 LAB — TSH: TSH: 1.833 u[IU]/mL (ref 0.350–4.500)

## 2015-01-13 MED ORDER — RIVAROXABAN 15 MG PO TABS: 15.0000 mg | ORAL_TABLET | Freq: Two times a day (BID) | ORAL | Status: DC

## 2015-01-13 NOTE — Patient Instructions (Addendum)
Encouraged increased rest and hydration, add probiotics, zinc such as Coldeze or Xicam. Treat fevers as needed. Probiotics daily Digestive Advantage or Hardin Negus colon health. Zyrtec  10 mg daily, Flonase daily, Mucinex 2 x daily   Preventive Care for Adults A healthy lifestyle and preventive care can promote health and wellness. Preventive health guidelines for men include the following key practices:  A routine yearly physical is a good way to check with your health care provider about your health and preventative screening. It is a chance to share any concerns and updates on your health and to receive a thorough exam.  Visit your dentist for a routine exam and preventative care every 6 months. Brush your teeth twice a day and floss once a day. Good oral hygiene prevents tooth decay and gum disease.  The frequency of eye exams is based on your age, health, family medical history, use of contact lenses, and other factors. Follow your health care provider's recommendations for frequency of eye exams.  Eat a healthy diet. Foods such as vegetables, fruits, whole grains, low-fat dairy products, and lean protein foods contain the nutrients you need without too many calories. Decrease your intake of foods high in solid fats, added sugars, and salt. Eat the right amount of calories for you.Get information about a proper diet from your health care provider, if necessary.  Regular physical exercise is one of the most important things you can do for your health. Most adults should get at least 150 minutes of moderate-intensity exercise (any activity that increases your heart rate and causes you to sweat) each week. In addition, most adults need muscle-strengthening exercises on 2 or more days a week.  Maintain a healthy weight. The body mass index (BMI) is a screening tool to identify possible weight problems. It provides an estimate of body fat based on height and weight. Your health care provider can find  your BMI and can help you achieve or maintain a healthy weight.For adults 20 years and older:  A BMI below 18.5 is considered underweight.  A BMI of 18.5 to 24.9 is normal.  A BMI of 25 to 29.9 is considered overweight.  A BMI of 30 and above is considered obese.  Maintain normal blood lipids and cholesterol levels by exercising and minimizing your intake of saturated fat. Eat a balanced diet with plenty of fruit and vegetables. Blood tests for lipids and cholesterol should begin at age 22 and be repeated every 5 years. If your lipid or cholesterol levels are high, you are over 50, or you are at high risk for heart disease, you may need your cholesterol levels checked more frequently.Ongoing high lipid and cholesterol levels should be treated with medicines if diet and exercise are not working.  If you smoke, find out from your health care provider how to quit. If you do not use tobacco, do not start.  Lung cancer screening is recommended for adults aged 24-80 years who are at high risk for developing lung cancer because of a history of smoking. A yearly low-dose CT scan of the lungs is recommended for people who have at least a 30-pack-year history of smoking and are a current smoker or have quit within the past 15 years. A pack year of smoking is smoking an average of 1 pack of cigarettes a day for 1 year (for example: 1 pack a day for 30 years or 2 packs a day for 15 years). Yearly screening should continue until the smoker has stopped smoking  for at least 15 years. Yearly screening should be stopped for people who develop a health problem that would prevent them from having lung cancer treatment.  If you choose to drink alcohol, do not have more than 2 drinks per day. One drink is considered to be 12 ounces (355 mL) of beer, 5 ounces (148 mL) of wine, or 1.5 ounces (44 mL) of liquor.  Avoid use of street drugs. Do not share needles with anyone. Ask for help if you need support or instructions  about stopping the use of drugs.  High blood pressure causes heart disease and increases the risk of stroke. Your blood pressure should be checked at least every 1-2 years. Ongoing high blood pressure should be treated with medicines, if weight loss and exercise are not effective.  If you are 1-63 years old, ask your health care provider if you should take aspirin to prevent heart disease.  Diabetes screening involves taking a blood sample to check your fasting blood sugar level. This should be done once every 3 years, after age 8, if you are within normal weight and without risk factors for diabetes. Testing should be considered at a younger age or be carried out more frequently if you are overweight and have at least 1 risk factor for diabetes.  Colorectal cancer can be detected and often prevented. Most routine colorectal cancer screening begins at the age of 96 and continues through age 34. However, your health care provider may recommend screening at an earlier age if you have risk factors for colon cancer. On a yearly basis, your health care provider may provide home test kits to check for hidden blood in the stool. Use of a small camera at the end of a tube to directly examine the colon (sigmoidoscopy or colonoscopy) can detect the earliest forms of colorectal cancer. Talk to your health care provider about this at age 18, when routine screening begins. Direct exam of the colon should be repeated every 5-10 years through age 51, unless early forms of precancerous polyps or small growths are found.  People who are at an increased risk for hepatitis B should be screened for this virus. You are considered at high risk for hepatitis B if:  You were born in a country where hepatitis B occurs often. Talk with your health care provider about which countries are considered high risk.  Your parents were born in a high-risk country and you have not received a shot to protect against hepatitis B  (hepatitis B vaccine).  You have HIV or AIDS.  You use needles to inject street drugs.  You live with, or have sex with, someone who has hepatitis B.  You are a man who has sex with other men (MSM).  You get hemodialysis treatment.  You take certain medicines for conditions such as cancer, organ transplantation, and autoimmune conditions.  Hepatitis C blood testing is recommended for all people born from [birth year] through 1965 and any individual with known risks for hepatitis C.  Practice safe sex. Use condoms and avoid high-risk sexual practices to reduce the spread of sexually transmitted infections (STIs). STIs include gonorrhea, chlamydia, syphilis, trichomonas, herpes, HPV, and human immunodeficiency virus (HIV). Herpes, HIV, and HPV are viral illnesses that have no cure. They can result in disability, cancer, and death.  If you are at risk of being infected with HIV, it is recommended that you take a prescription medicine daily to prevent HIV infection. This is called preexposure prophylaxis (PrEP). You are  considered at risk if:  You are a man who has sex with other men (MSM) and have other risk factors.  You are a heterosexual man, are sexually active, and are at increased risk for HIV infection.  You take drugs by injection.  You are sexually active with a partner who has HIV.  Talk with your health care provider about whether you are at high risk of being infected with HIV. If you choose to begin PrEP, you should first be tested for HIV. You should then be tested every 3 months for as long as you are taking PrEP.  A one-time screening for abdominal aortic aneurysm (AAA) and surgical repair of large AAAs by ultrasound are recommended for men ages 24 to 13 years who are current or former smokers.  Healthy men should no longer receive prostate-specific antigen (PSA) blood tests as part of routine cancer screening. Talk with your health care provider about prostate cancer  screening.  Testicular cancer screening is not recommended for adult males who have no symptoms. Screening includes self-exam, a health care provider exam, and other screening tests. Consult with your health care provider about any symptoms you have or any concerns you have about testicular cancer.  Use sunscreen. Apply sunscreen liberally and repeatedly throughout the day. You should seek shade when your shadow is shorter than you. Protect yourself by wearing long sleeves, pants, a wide-brimmed hat, and sunglasses year round, whenever you are outdoors.  Once a month, do a whole-body skin exam, using a mirror to look at the skin on your back. Tell your health care provider about new moles, moles that have irregular borders, moles that are larger than a pencil eraser, or moles that have changed in shape or color.  Stay current with required vaccines (immunizations).  Influenza vaccine. All adults should be immunized every year.  Tetanus, diphtheria, and acellular pertussis (Td, Tdap) vaccine. An adult who has not previously received Tdap or who does not know his vaccine status should receive 1 dose of Tdap. This initial dose should be followed by tetanus and diphtheria toxoids (Td) booster doses every 10 years. Adults with an unknown or incomplete history of completing a 3-dose immunization series with Td-containing vaccines should begin or complete a primary immunization series including a Tdap dose. Adults should receive a Td booster every 10 years.  Varicella vaccine. An adult without evidence of immunity to varicella should receive 2 doses or a second dose if he has previously received 1 dose.  Human papillomavirus (HPV) vaccine. Males aged 38-21 years who have not received the vaccine previously should receive the 3-dose series. Males aged 22-26 years may be immunized. Immunization is recommended through the age of 35 years for any male who has sex with males and did not get any or all doses  earlier. Immunization is recommended for any person with an immunocompromised condition through the age of 13 years if he did not get any or all doses earlier. During the 3-dose series, the second dose should be obtained 4-8 weeks after the first dose. The third dose should be obtained 24 weeks after the first dose and 16 weeks after the second dose.  Zoster vaccine. One dose is recommended for adults aged 8 years or older unless certain conditions are present.  Measles, mumps, and rubella (MMR) vaccine. Adults born before [birth year] generally are considered immune to measles and mumps. Adults born in [birth year] or later should have 1 or more doses of MMR vaccine unless there is a  contraindication to the vaccine or there is laboratory evidence of immunity to each of the three diseases. A routine second dose of MMR vaccine should be obtained at least 28 days after the first dose for students attending postsecondary schools, health care workers, or international travelers. People who received inactivated measles vaccine or an unknown type of measles vaccine during 1963-1967 should receive 2 doses of MMR vaccine. People who received inactivated mumps vaccine or an unknown type of mumps vaccine before 1979 and are at high risk for mumps infection should consider immunization with 2 doses of MMR vaccine. Unvaccinated health care workers born before [birth year] who lack laboratory evidence of measles, mumps, or rubella immunity or laboratory confirmation of disease should consider measles and mumps immunization with 2 doses of MMR vaccine or rubella immunization with 1 dose of MMR vaccine.  Pneumococcal 13-valent conjugate (PCV13) vaccine. When indicated, a person who is uncertain of his immunization history and has no record of immunization should receive the PCV13 vaccine. An adult aged 66 years or older who has certain medical conditions and has not been previously immunized should receive 1 dose of PCV13 vaccine. This PCV13  should be followed with a dose of pneumococcal polysaccharide (PPSV23) vaccine. The PPSV23 vaccine dose should be obtained at least 8 weeks after the dose of PCV13 vaccine. An adult aged 76 years or older who has certain medical conditions and previously received 1 or more doses of PPSV23 vaccine should receive 1 dose of PCV13. The PCV13 vaccine dose should be obtained 1 or more years after the last PPSV23 vaccine dose.  Pneumococcal polysaccharide (PPSV23) vaccine. When PCV13 is also indicated, PCV13 should be obtained first. All adults aged 17 years and older should be immunized. An adult younger than age 84 years who has certain medical conditions should be immunized. Any person who resides in a nursing home or long-term care facility should be immunized. An adult smoker should be immunized. People with an immunocompromised condition and certain other conditions should receive both PCV13 and PPSV23 vaccines. People with human immunodeficiency virus (HIV) infection should be immunized as soon as possible after diagnosis. Immunization during chemotherapy or radiation therapy should be avoided. Routine use of PPSV23 vaccine is not recommended for American Indians, [place] Natives, or people younger than 65 years unless there are medical conditions that require PPSV23 vaccine. When indicated, people who have unknown immunization and have no record of immunization should receive PPSV23 vaccine. One-time revaccination 5 years after the first dose of PPSV23 is recommended for people aged 19-64 years who have chronic kidney failure, nephrotic syndrome, asplenia, or immunocompromised conditions. People who received 1-2 doses of PPSV23 before age 42 years should receive another dose of PPSV23 vaccine at age 68 years or later if at least 5 years have passed since the previous dose. Doses of PPSV23 are not needed for people immunized with PPSV23 at or after age 49 years.  Meningococcal vaccine. Adults with asplenia or  persistent complement component deficiencies should receive 2 doses of quadrivalent meningococcal conjugate (MenACWY-D) vaccine. The doses should be obtained at least 2 months apart. Microbiologists working with certain meningococcal bacteria, [place] recruits, people at risk during an outbreak, and people who travel to or live in countries with a high rate of meningitis should be immunized. A first-year college student up through age 60 years who is living in a residence hall should receive a dose if he did not receive a dose on or after his 16th birthday. Adults who have certain  high-risk conditions should receive one or more doses of vaccine.  Hepatitis A vaccine. Adults who wish to be protected from this disease, have certain high-risk conditions, work with hepatitis A-infected animals, work in hepatitis A research labs, or travel to or work in countries with a high rate of hepatitis A should be immunized. Adults who were previously unvaccinated and who anticipate close contact with an international adoptee during the first 60 days after arrival in the [place] from a country with a high rate of hepatitis A should be immunized.  Hepatitis B vaccine. Adults should be immunized if they wish to be protected from this disease, have certain high-risk conditions, may be exposed to blood or other infectious body fluids, are household contacts or sex partners of hepatitis B positive people, are clients or workers in certain care facilities, or travel to or work in countries with a high rate of hepatitis B.  Haemophilus influenzae type b (Hib) vaccine. A previously unvaccinated person with asplenia or sickle cell disease or having a scheduled splenectomy should receive 1 dose of Hib vaccine. Regardless of previous immunization, a recipient of a hematopoietic stem cell transplant should receive a 3-dose series 6-12 months after his successful transplant. Hib vaccine is not recommended for adults with HIV  infection. Preventive Service / Frequency Ages 31 to 85  Blood pressure check.** / Every 1 to 2 years.  Lipid and cholesterol check.** / Every 5 years beginning at age 55.  Hepatitis C blood test.** / For any individual with known risks for hepatitis C.  Skin self-exam. / Monthly.  Influenza vaccine. / Every year.  Tetanus, diphtheria, and acellular pertussis (Tdap, Td) vaccine.** / Consult your health care provider. 1 dose of Td every 10 years.  Varicella vaccine.** / Consult your health care provider.  HPV vaccine. / 3 doses over 6 months, if 51 or younger.  Measles, mumps, rubella (MMR) vaccine.** / You need at least 1 dose of MMR if you were born in 1957 or later. You may also need a second dose.  Pneumococcal 13-valent conjugate (PCV13) vaccine.** / Consult your health care provider.  Pneumococcal polysaccharide (PPSV23) vaccine.** / 1 to 2 doses if you smoke cigarettes or if you have certain conditions.  Meningococcal vaccine.** / 1 dose if you are age 8 to 65 years and a Market researcher living in a residence hall, or have one of several medical conditions. You may also need additional booster doses.  Hepatitis A vaccine.** / Consult your health care provider.  Hepatitis B vaccine.** / Consult your health care provider.  Haemophilus influenzae type b (Hib) vaccine.** / Consult your health care provider. Ages 23 to 37  Blood pressure check.** / Every 1 to 2 years.  Lipid and cholesterol check.** / Every 5 years beginning at age 31.  Lung cancer screening. / Every year if you are aged 61-80 years and have a 30-pack-year history of smoking and currently smoke or have quit within the past 15 years. Yearly screening is stopped once you have quit smoking for at least 15 years or develop a health problem that would prevent you from having lung cancer treatment.  Fecal occult blood test (FOBT) of stool. / Every year beginning at age 34 and continuing until age 77.  You may not have to do this test if you get a colonoscopy every 10 years.  Flexible sigmoidoscopy** or colonoscopy.** / Every 5 years for a flexible sigmoidoscopy or every 10 years for a colonoscopy beginning at age  49 and continuing until age 56.  Hepatitis C blood test.** / For all people born from [birth year] through 1966 and any individual with known risks for hepatitis C.  Skin self-exam. / Monthly.  Influenza vaccine. / Every year.  Tetanus, diphtheria, and acellular pertussis (Tdap/Td) vaccine.** / Consult your health care provider. 1 dose of Td every 10 years.  Varicella vaccine.** / Consult your health care provider.  Zoster vaccine.** / 1 dose for adults aged 48 years or older.  Measles, mumps, rubella (MMR) vaccine.** / You need at least 1 dose of MMR if you were born in 1958 or later. You may also need a second dose.  Pneumococcal 13-valent conjugate (PCV13) vaccine.** / Consult your health care provider.  Pneumococcal polysaccharide (PPSV23) vaccine.** / 1 to 2 doses if you smoke cigarettes or if you have certain conditions.  Meningococcal vaccine.** / Consult your health care provider.  Hepatitis A vaccine.** / Consult your health care provider.  Hepatitis B vaccine.** / Consult your health care provider.  Haemophilus influenzae type b (Hib) vaccine.** / Consult your health care provider. Ages 66 and over  Blood pressure check.** / Every 1 to 2 years.  Lipid and cholesterol check.**/ Every 5 years beginning at age 64.  Lung cancer screening. / Every year if you are aged 15-80 years and have a 30-pack-year history of smoking and currently smoke or have quit within the past 15 years. Yearly screening is stopped once you have quit smoking for at least 15 years or develop a health problem that would prevent you from having lung cancer treatment.  Fecal occult blood test (FOBT) of stool. / Every year beginning at age 43 and continuing until age 69. You may not have to do this  test if you get a colonoscopy every 10 years.  Flexible sigmoidoscopy** or colonoscopy.** / Every 5 years for a flexible sigmoidoscopy or every 10 years for a colonoscopy beginning at age 42 and continuing until age 58.  Hepatitis C blood test.** / For all people born from [birth year] through 1966 and any individual with known risks for hepatitis C.  Abdominal aortic aneurysm (AAA) screening.** / A one-time screening for ages 58 to 41 years who are current or former smokers.  Skin self-exam. / Monthly.  Influenza vaccine. / Every year.  Tetanus, diphtheria, and acellular pertussis (Tdap/Td) vaccine.** / 1 dose of Td every 10 years.  Varicella vaccine.** / Consult your health care provider.  Zoster vaccine.** / 1 dose for adults aged 11 years or older.  Pneumococcal 13-valent conjugate (PCV13) vaccine.** / Consult your health care provider.  Pneumococcal polysaccharide (PPSV23) vaccine.** / 1 dose for all adults aged 28 years and older.  Meningococcal vaccine.** / Consult your health care provider.  Hepatitis A vaccine.** / Consult your health care provider.  Hepatitis B vaccine.** / Consult your health care provider.  Haemophilus influenzae type b (Hib) vaccine.** / Consult your health care provider. **Family history and personal history of risk and conditions may change your health care provider's recommendations. Document Released: 06/25/2001 Document Revised: 05/04/2013 Document Reviewed: 09/24/2010 [facility] Patient Information 2015 [place], [place]. This information is not intended to replace advice given to you by your health care provider. Make sure you discuss any questions you have with your health care provider.

## 2015-01-13 NOTE — Progress Notes (Signed)
Subjective:    Patient ID: Christian [name], male    DOB: 1945-05-21, 69 y.o.   MRN: [id number]  Chief Complaint  Patient presents with  . Establish Care    HPI Patient is in today for for new patient appointment. Has a past medical history significant for hypertension, hyperlipidemia, sleep apnea, hyperglycemia and DVT, tolerating Xarelto. He denies any current acute illness but was recently treated for upper respiratory infection. No fevers or chills. Denies CP/palp/SOB/HA/congestion/fevers/GI or GU c/o. Taking meds as prescribed  Past Medical History  Diagnosis Date  . High blood pressure   . High cholesterol     History reviewed. No pertinent past surgical history.  Family History  Problem Relation Age of Onset  . Stroke Father     from surgery  . Emphysema Father     smoker    Social History   Social History  . Marital Status: Married    Spouse Name: N/A  . Number of Children: 1  . Years of Education: N/A   Occupational History  . Retired    Social History Main Topics  . Smoking status: Never Smoker   . Smokeless tobacco: Not on file  . Alcohol Use: 0.0 oz/week    0 Standard drinks or equivalent per week     Comment: 2-3 glasses of wine every night  . Drug Use: No  . Sexual Activity: Yes   Other Topics Concern  . Not on file   Social History Narrative    Outpatient Prescriptions Prior to Visit  Medication Sig Dispense Refill  . hydrochlorothiazide (HYDRODIURIL) 25 MG tablet   3  . losartan (COZAAR) 50 MG tablet   3  . Multiple Vitamin (MULTIVITAMIN) tablet Take 1 tablet by mouth daily.    . pravastatin (PRAVACHOL) 40 MG tablet   3  . vitamin C (ASCORBIC ACID) 500 MG tablet Take 500 mg by mouth daily.    Alveda Reasons STARTER PACK 15 & 20 MG TBPK Take 15-20 mg by mouth as directed. Take as directed on package: Start with one 56m tablet by mouth twice a day with food. On Day 22, switch to one 238mtablet once a day with food. 51 each 0  . aspirin 81 MG  tablet Take 81 mg by mouth daily.    . traMADol (ULTRAM) 50 MG tablet Take 1 tablet (50 mg total) by mouth every 12 (twelve) hours as needed. (Patient not taking: Reported on 01/13/2015) 10 tablet 0   No facility-administered medications prior to visit.    Allergies  Allergen Reactions  . Codeine Anaphylaxis    Review of Systems  Constitutional: Negative for fever, chills and malaise/fatigue.  HENT: Negative for congestion and hearing loss.   Eyes: Negative for discharge.  Respiratory: Negative for cough, sputum production and shortness of breath.   Cardiovascular: Negative for chest pain, palpitations and leg swelling.  Gastrointestinal: Negative for heartburn, nausea, vomiting, abdominal pain, diarrhea, constipation and blood in stool.  Genitourinary: Negative for dysuria, urgency, frequency and hematuria.  Musculoskeletal: Negative for myalgias, back pain and falls.  Skin: Negative for rash.  Neurological: Negative for dizziness, sensory change, loss of consciousness, weakness and headaches.  Endo/Heme/Allergies: Negative for environmental allergies. Does not bruise/bleed easily.  Psychiatric/Behavioral: Negative for depression and suicidal ideas. The patient is not nervous/anxious and does not have insomnia.        Objective:    Physical Exam  Constitutional: He is oriented to person, place, and time. He appears well-developed  and well-nourished. No distress.  HENT:  Head: Normocephalic and atraumatic.  Eyes: Conjunctivae are normal.  Neck: Neck supple. No thyromegaly present.  Cardiovascular: Normal rate, regular rhythm and normal heart sounds.   No murmur heard. Pulmonary/Chest: Effort normal and breath sounds normal. No respiratory distress. He has no wheezes.  Abdominal: Soft. Bowel sounds are normal. He exhibits no mass. There is no tenderness.  Musculoskeletal: He exhibits no edema.  Lymphadenopathy:    He has no cervical adenopathy.  Neurological: He is alert and  oriented to person, place, and time.  Skin: Skin is warm and dry.  Psychiatric: He has a normal mood and affect. His behavior is normal.    BP 156/68 mmHg  Pulse 83  Temp(Src) 98.4 F (36.9 C) (Oral)  Ht 5' 8.5" (1.74 m)  Wt 227 lb 2 oz (103.023 kg)  BMI 34.03 kg/m2  SpO2 96% Wt Readings from Last 3 Encounters:  01/13/15 227 lb 2 oz (103.023 kg)  12/21/14 224 lb (101.606 kg)  12/21/14 224 lb (101.606 kg)     Lab Results  Component Value Date   WBC 10.3 12/21/2014   HGB 14.1 12/21/2014   HCT 43.5 12/21/2014   PLT 227 12/21/2014   GLUCOSE 131* 12/21/2014   ALT 30 01/18/2009   AST 30 01/18/2009   NA 139 12/21/2014   K 3.2* 12/21/2014   CL 101 12/21/2014   CREATININE 0.85 12/21/2014   BUN 13 12/21/2014   CO2 28 12/21/2014    No results found for: TSH Lab Results  Component Value Date   WBC 10.3 12/21/2014   HGB 14.1 12/21/2014   HCT 43.5 12/21/2014   MCV 96.9 12/21/2014   PLT 227 12/21/2014   Lab Results  Component Value Date   NA 139 12/21/2014   K 3.2* 12/21/2014   CO2 28 12/21/2014   GLUCOSE 131* 12/21/2014   BUN 13 12/21/2014   CREATININE 0.85 12/21/2014   BILITOT 0.7 01/18/2009   ALKPHOS 66 01/18/2009   AST 30 01/18/2009   ALT 30 01/18/2009   PROT 7.9 01/18/2009   ALBUMIN 4.5 01/18/2009   CALCIUM 9.4 12/21/2014   ANIONGAP 10 12/21/2014       Assessment & Plan:  Benign essential HTN Well controlled, no changes to meds. Encouraged heart healthy diet such as the DASH diet and exercise as tolerated.   Hypokalemia Mild, increase in diet and monitor  Sun-damaged skin Referred to dermatology  Hyperglycemia hgba1c acceptable, minimize simple carbs. Increase exercise as tolerated.   Obesity Encouraged DASH diet, decrease po intake and increase exercise as tolerated. Needs 7-8 hours of sleep nightly. Avoid trans fats, eat small, frequent meals every 4-5 hours with lean proteins, complex carbs and healthy fats. Minimize simple carbs, GMO  foods.  Hyperlipidemia, mild Tolerating statin, encouraged heart healthy diet, avoid trans fats, minimize simple carbs and saturated fats. Increase exercise as tolerated  Obstructive sleep apnea Follows with pulmonology    I am having Christian [name] maintain his hydrochlorothiazide, losartan, pravastatin, aspirin, multivitamin, vitamin C, traMADol, and XARELTO STARTER PACK.  No orders of the defined types were placed in this encounter.     Penni Homans, MD

## 2015-01-13 NOTE — Progress Notes (Signed)
Pre visit review using our clinic review tool, if applicable. No additional management support is needed unless otherwise documented below in the visit note.

## 2015-01-17 ENCOUNTER — Telehealth: Payer: Self-pay | Admitting: Family Medicine

## 2015-01-17 ENCOUNTER — Telehealth: Payer: Self-pay | Admitting: *Deleted

## 2015-01-17 NOTE — Telephone Encounter (Signed)
Relation to VO:PFYT Call back number:[phone] patient is out of town please leave a message  Pharmacy:  Reason for call:  Patient inquiring about lab results

## 2015-01-17 NOTE — Telephone Encounter (Signed)
Error:[id number]  ° °

## 2015-01-17 NOTE — Telephone Encounter (Signed)
Prior authorization for Xarelto initiated. Awaiting determination. JG//CMA

## 2015-01-17 NOTE — Telephone Encounter (Signed)
PT CAME BY THE OFFICE TO GET LAB RESULTS, PT WOULD LIKE FOR YOU TO GIVE HIM A CALL TODAY IF POSSIBLE STATES HE WILL BE HOME BEFORE 5

## 2015-01-17 NOTE — Telephone Encounter (Signed)
Patient states insurance approved medication. Please advise

## 2015-01-17 NOTE — Telephone Encounter (Signed)
Patient notified of results.

## 2015-01-18 ENCOUNTER — Encounter: Payer: Self-pay | Admitting: Family Medicine

## 2015-01-18 NOTE — Telephone Encounter (Signed)
I haven't received approval at this time.

## 2015-01-19 NOTE — Telephone Encounter (Signed)
Approved.  

## 2015-01-24 ENCOUNTER — Telehealth: Payer: Self-pay | Admitting: *Deleted

## 2015-01-24 NOTE — Telephone Encounter (Signed)
Medical records received via mail from [place]. Forwarded to [employer]. JG//CMA

## 2015-01-27 ENCOUNTER — Encounter: Payer: Self-pay | Admitting: Family Medicine

## 2015-01-29 ENCOUNTER — Encounter: Payer: Self-pay | Admitting: Family Medicine

## 2015-01-29 DIAGNOSIS — I10 Essential (primary) hypertension: Secondary | ICD-10-CM | POA: Insufficient documentation

## 2015-01-29 DIAGNOSIS — L578 Other skin changes due to chronic exposure to nonionizing radiation: Secondary | ICD-10-CM | POA: Insufficient documentation

## 2015-01-29 DIAGNOSIS — R739 Hyperglycemia, unspecified: Secondary | ICD-10-CM | POA: Insufficient documentation

## 2015-01-29 NOTE — Assessment & Plan Note (Signed)
Well controlled, no changes to meds. Encouraged heart healthy diet such as the DASH diet and exercise as tolerated.

## 2015-01-29 NOTE — Assessment & Plan Note (Signed)
Follows with pulmonology

## 2015-01-29 NOTE — Assessment & Plan Note (Signed)
Tolerating statin, encouraged heart healthy diet, avoid trans fats, minimize simple carbs and saturated fats. Increase exercise as tolerated 

## 2015-01-29 NOTE — Assessment & Plan Note (Signed)
Mild, increase in diet and monitor

## 2015-01-29 NOTE — Assessment & Plan Note (Signed)
Referred to dermatology

## 2015-01-29 NOTE — Assessment & Plan Note (Signed)
Encouraged DASH diet, decrease po intake and increase exercise as tolerated. Needs 7-8 hours of sleep nightly. Avoid trans fats, eat small, frequent meals every 4-5 hours with lean proteins, complex carbs and healthy fats. Minimize simple carbs, GMO foods. 

## 2015-01-29 NOTE — Assessment & Plan Note (Signed)
hgba1c acceptable, minimize simple carbs. Increase exercise as tolerated.  

## 2015-02-09 ENCOUNTER — Ambulatory Visit (INDEPENDENT_AMBULATORY_CARE_PROVIDER_SITE_OTHER): Payer: Medicare Other | Admitting: Internal Medicine

## 2015-02-09 ENCOUNTER — Encounter: Payer: Self-pay | Admitting: Internal Medicine

## 2015-02-09 VITALS — BP 154/80 | HR 86 | Ht 69.0 in | Wt 229.4 lb

## 2015-02-09 DIAGNOSIS — I82409 Acute embolism and thrombosis of unspecified deep veins of unspecified lower extremity: Secondary | ICD-10-CM

## 2015-02-09 DIAGNOSIS — J310 Chronic rhinitis: Secondary | ICD-10-CM | POA: Diagnosis not present

## 2015-02-09 DIAGNOSIS — E669 Obesity, unspecified: Secondary | ICD-10-CM

## 2015-02-09 DIAGNOSIS — G4733 Obstructive sleep apnea (adult) (pediatric): Secondary | ICD-10-CM

## 2015-02-09 DIAGNOSIS — Z23 Encounter for immunization: Secondary | ICD-10-CM

## 2015-02-09 MED ORDER — AZELASTINE-FLUTICASONE 137-50 MCG/ACT NA SUSP: 2.0000 | Freq: Every day | NASAL | Status: DC

## 2015-02-09 NOTE — Patient Instructions (Signed)
We  Can continue CPAP 12/ [facility]  Sample Dymista nasal spray for rhinitis with post-nasal drainage  1-2 puffs each nostril once daily at bedtime  Flu vax

## 2015-02-09 NOTE — Progress Notes (Signed)
Pt needing new CPAP machine. Current machine seems to be functioning okay just very noisy.Former patient of Dr Gwenette Greet from 2007. Pt requested sooner appt than 06/28/14 appt that [facility] had available. Pt was okay with switching MD's. Scheduled with [facility] 06/10/14 at 10:30 for sleep consult. Pt aware to arrive 15-2mns early for appt to fill out paperwork. Nothing further needed.  06/10/14- 89yoM FOLLOW FOR:  former patient of Dr. CGwenette Greet needs new CPAP equipment; Epworth Score: 3 He has been using this all night, every night at 12 cwp. No snore and good control of sleep quality.  We are seeking his diagnostic sleep study report. He had been using an online source for CPAP supplies but wants to switch back to [facility]. Bedtime around 9:30 PM, short sleep latency, waking once or twice before up at 7 AM. Weight stable. Nasal polypectomy years ago, no other ENT surgery. Medical problems with hypertension but no heart or lung disease. Never smoked.  08/09/14- 89yoM FOLLOW FOR:  former patient of Dr. CGwenette Greet needs new CPAP equipment; Epworth Score: 3 FOLLOWS FOR: DME is [facility]; wears CPAP 12  / [facility] every night for about 8-9 hours. DL [place] [facility] attached to last OV notes. NPSG 03/29/00  AHI 69.6/ hr, CPAP to 11, weight 200 lbs  02/09/15-68 yoM never smoker followed for OSA, complicated by hx DVT/Xarelto, HBP, obesity FOLLOWS FOR: pt. wears cpap 12/ [facility] every night 8 hours. no supplies needed at this time DME: [facility]. Download confirms good compliance and control at pressure 12 with good improvement in snoring and sleepiness. Complains of persistent postnasal drip following a cold 1 month ago with no headache or purulent discharge.  ROS-see HPI Constitutional:   No-   weight loss, night sweats, fevers, chills, fatigue, lassitude. HEENT:   No-  headaches, difficulty swallowing, tooth/dental problems, sore throat,       No-  sneezing, itching, ear ache, nasal congestion, + post nasal drip,  CV:  No-    chest pain, orthopnea, PND, swelling in lower extremities, anasarca,                                                          dizziness, palpitations Resp: No-   shortness of breath with exertion or at rest.              No-   productive cough,  No non-productive cough,  No- coughing up of blood.              No-   change in color of mucus.  No- wheezing.   Skin: No-   rash or lesions. GI:  No-   heartburn, indigestion, abdominal pain, nausea, vomiting,  GU:  MS:  No-   joint pain or swelling.   Neuro-     nothing unusual Psych:  No- change in mood or affect. No depression or anxiety.  No memory loss.  OBJ- Physical Exam General- Alert, Oriented, Affect-appropriate, Distress- none acute, + overweight Skin- rash-none, lesions- none, excoriation- none Lymphadenopathy- none Head- atraumatic            Eyes- Gross vision intact, PERRLA, conjunctivae and secretions clear            Ears- Hearing, canals-normal  Nose-  no-Septal dev, mucus +, polyps, erosion, perforation             Throat- Mallampati IV , mucosa clear , drainage- none, tonsils- atrophic Neck- flexible , trachea midline, no stridor , thyroid nl, carotid no bruit Chest - symmetrical excursion , unlabored           Heart/CV- RRR , no murmur , no gallop  , no rub, nl s1 s2                           - JVD- none , edema- none, stasis changes- none, varices- none           Lung- clear to P&A, wheeze- none, cough- none , dullness-none, rub- none           Chest wall-  Abd- Br/ Gen/ Rectal- Not done, not indicated Extrem- cyanosis- none, clubbing, none, atrophy- none, strength- nl Neuro- grossly intact to observation

## 2015-02-10 DIAGNOSIS — J310 Chronic rhinitis: Secondary | ICD-10-CM | POA: Insufficient documentation

## 2015-02-10 NOTE — Assessment & Plan Note (Signed)
Download confirms good compliance and control. He describes good improvement in sleep quality with CPAP and continuation is medically necessary.

## 2015-02-10 NOTE — Assessment & Plan Note (Signed)
Plan-sample Dymista for trial with discussion

## 2015-02-10 NOTE — Assessment & Plan Note (Signed)
Chronic Xarelto therapy followed by Dr. Charlett Blake

## 2015-02-10 NOTE — Assessment & Plan Note (Signed)
Observed that weight loss would help several of his medical problems

## 2015-02-20 DIAGNOSIS — G4733 Obstructive sleep apnea (adult) (pediatric): Secondary | ICD-10-CM | POA: Diagnosis not present

## 2015-03-02 DIAGNOSIS — H25813 Combined forms of age-related cataract, bilateral: Secondary | ICD-10-CM | POA: Diagnosis not present

## 2015-03-13 DIAGNOSIS — L821 Other seborrheic keratosis: Secondary | ICD-10-CM | POA: Diagnosis not present

## 2015-03-13 DIAGNOSIS — L570 Actinic keratosis: Secondary | ICD-10-CM | POA: Diagnosis not present

## 2015-03-13 DIAGNOSIS — L218 Other seborrheic dermatitis: Secondary | ICD-10-CM | POA: Diagnosis not present

## 2015-03-23 DIAGNOSIS — G4733 Obstructive sleep apnea (adult) (pediatric): Secondary | ICD-10-CM | POA: Diagnosis not present

## 2015-04-20 ENCOUNTER — Ambulatory Visit (INDEPENDENT_AMBULATORY_CARE_PROVIDER_SITE_OTHER): Payer: Medicare Other | Admitting: Family Medicine

## 2015-04-20 ENCOUNTER — Encounter: Payer: Self-pay | Admitting: Family Medicine

## 2015-04-20 VITALS — BP 148/78 | HR 78 | Temp 98.6°F | Ht 69.0 in | Wt 232.0 lb

## 2015-04-20 DIAGNOSIS — Z1159 Encounter for screening for other viral diseases: Secondary | ICD-10-CM

## 2015-04-20 DIAGNOSIS — E669 Obesity, unspecified: Secondary | ICD-10-CM

## 2015-04-20 DIAGNOSIS — I10 Essential (primary) hypertension: Secondary | ICD-10-CM | POA: Diagnosis not present

## 2015-04-20 DIAGNOSIS — R739 Hyperglycemia, unspecified: Secondary | ICD-10-CM | POA: Diagnosis not present

## 2015-04-20 DIAGNOSIS — E785 Hyperlipidemia, unspecified: Secondary | ICD-10-CM | POA: Diagnosis not present

## 2015-04-20 DIAGNOSIS — E876 Hypokalemia: Secondary | ICD-10-CM | POA: Diagnosis not present

## 2015-04-20 DIAGNOSIS — Z23 Encounter for immunization: Secondary | ICD-10-CM | POA: Diagnosis not present

## 2015-04-20 DIAGNOSIS — G4733 Obstructive sleep apnea (adult) (pediatric): Secondary | ICD-10-CM

## 2015-04-20 LAB — HEMOGLOBIN A1C: Hgb A1c MFr Bld: 5.7 % (ref 4.6–6.5)

## 2015-04-20 LAB — LIPID PANEL
Cholesterol: 161 mg/dL (ref 0–200)
HDL: 46.1 mg/dL (ref >39.00)
LDL Cholesterol: 87 mg/dL (ref 0–99)
NonHDL: 115.13
Total CHOL/HDL Ratio: 3
Triglycerides: 139 mg/dL (ref 0.0–149.0)
VLDL: 27.8 mg/dL (ref 0.0–40.0)

## 2015-04-20 LAB — CBC
HEMATOCRIT: 45.1 % (ref 39.0–52.0)
Hemoglobin: 15.1 g/dL (ref 13.0–17.0)
MCHC: 33.4 g/dL (ref 30.0–36.0)
MCV: 94.2 fl (ref 78.0–100.0)
Platelets: 256 10*3/uL (ref 150.0–400.0)
RBC: 4.79 Mil/uL (ref 4.22–5.81)
RDW: 13.7 % (ref 11.5–15.5)
WBC: 7.8 10*3/uL (ref 4.0–10.5)

## 2015-04-20 LAB — COMPREHENSIVE METABOLIC PANEL
ALT: 55 U/L — ABNORMAL HIGH (ref 0–53)
AST: 36 U/L (ref 0–37)
Albumin: 4.4 g/dL (ref 3.5–5.2)
Alkaline Phosphatase: 60 U/L (ref 39–117)
BUN: 12 mg/dL (ref 6–23)
CO2: 27 mEq/L (ref 19–32)
Calcium: 10.2 mg/dL (ref 8.4–10.5)
Chloride: 102 mEq/L (ref 96–112)
Creatinine, Ser: 0.78 mg/dL (ref 0.40–1.50)
GFR: 104.51 mL/min (ref >60.00)
GLUCOSE: 90 mg/dL (ref 70–99)
POTASSIUM: 3.6 meq/L (ref 3.5–5.1)
Sodium: 138 mEq/L (ref 135–145)
TOTAL PROTEIN: 8.1 g/dL (ref 6.0–8.3)
Total Bilirubin: 0.5 mg/dL (ref 0.2–1.2)

## 2015-04-20 LAB — TSH: TSH: 1.92 u[IU]/mL (ref 0.35–4.50)

## 2015-04-20 MED ORDER — LOSARTAN POTASSIUM 100 MG PO TABS: 100.0000 mg | ORAL_TABLET | Freq: Every day | ORAL | Status: DC

## 2015-04-20 NOTE — Progress Notes (Signed)
Christian [name] [id number] 1945-12-21 04/20/2015      Patient Progress Note   Subjective  Chief Complaint  Chief Complaint  Patient presents with  . Follow-up    HPI  69 year old male presents for routine care. Overall doing well. Tolerating xarelto, had one episode of bleeding with biting his tongue where there was persistent bleeding but it stopped after sucking on a tea bag for 30 minutes. Taking all medications as listed except aspirin. Tolerating well. Discussed blood pressure, states that his exercise has decreased since the DVT but he is going to resume it again soon. He also does not like to be active when it is cold outside. His caloric intake has increased due to sitting around and being bored but the quality of his food has improved. He is eating more salad and is eating less right before bed. Still 100% compliant with CPAP and is seeing a specialist every 6 months for follow up.  Patient denies shortness of breath, chest pain,changes in urination, GI issues, recent fevers or illnesses    Past Medical History  Diagnosis Date  . DVT of lower limb, acute ([place]) 01/13/2015  . History of chicken pox 01/13/2015  . Hypokalemia 01/13/2015  . Obesity   . High cholesterol   . Hyperlipidemia, mild 01/13/2015  . High blood pressure     No past surgical history on file.  Family History  Problem Relation Age of Onset  . Stroke Father     from surgery  . Emphysema Father     smoker  . Heart disease Father     carotid artery disease, vasculopathy  . Allergic Disorder Sister     Social History   Social History  . Marital Status: Married    Spouse Name: N/A  . Number of Children: 1  . Years of Education: N/A   Occupational History  . Retired    Social History Main Topics  . Smoking status: Never Smoker   . Smokeless tobacco: Not on file  . Alcohol Use: 0.0 oz/week    0 Standard drinks or equivalent per week     Comment: 2-3 glasses of wine every night  . Drug Use: No  .  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 Not on file   Social History Narrative    Current Outpatient Prescriptions on File Prior to Visit  Medication Sig Dispense Refill  . hydrochlorothiazide (HYDRODIURIL) 25 MG tablet   3  . losartan (COZAAR) 50 MG tablet   3  . Multiple Vitamin (MULTIVITAMIN) tablet Take 1 tablet by mouth daily.    . pravastatin (PRAVACHOL) 40 MG tablet   3  . Rivaroxaban (XARELTO) 15 MG TABS tablet Take 1 tablet (15 mg total) by mouth 2 (two) times daily with a meal. 60 tablet 2  . vitamin C (ASCORBIC ACID) 500 MG tablet Take 500 mg by mouth daily.    [name] aspirin 81 MG tablet Take 81 mg by mouth daily.    . Azelastine-Fluticasone (DYMISTA) 137-50 MCG/ACT SUSP Place 2 sprays into both nostrils at bedtime. (Patient not taking: Reported on 04/20/2015) 1 Bottle 0   No current facility-administered medications on file prior to visit.    Allergies  Allergen Reactions  . Codeine Anaphylaxis    Review of Systems   Constitutional: Negative for fever and malaise/fatigue.  HENT: Negative for congestion.  Eyes: Negative  for discharge.  Respiratory: Negative for shortness of breath.  Cardiovascular: Negative for chest pain, palpitations and leg swelling.  Gastrointestinal: Negative for nausea, abdominal pain and diarrhea.  Genitourinary: Negative for dysuria and urgency, hematuria and flank pain.  Musculoskeletal: Negative for myalgias and falls.  Skin: Negative for rash.  Neurological: Negative for loss of consciousness and headaches.  Endo/Heme/Allergies: Negative for polydipsia.  Psychiatric/Behavioral: Negative for depression and suicidal ideas. The patient is not nervous/anxious and does not have insomnia.   Objective  BP 162/76 mmHg  Pulse 78  Temp(Src) 98.6 F (37 C) (Oral)  Ht _0  (1.753 m)  Wt 232 lb (105.235 kg)  BMI 34.24 kg/m2  SpO2  98%  Physical Exam  Constitutional: Oriented to person, place, and time. Appears well-nourished. No distress.  Eyes: EOM are normal. Pupils are equal, round, and reactive to light.  Cardiovascular: Normal rate and regular rhythm.  Pulmonary/Chest: Breath sounds normal.  Abdominal: Soft. Bowel sounds are normal.  Lymphadenopathy:   No cervical adenopathy.  Neurological: Alert and oriented to person, place, and time. Normal reflexes. No cranial nerve deficit.   Assessment & Plan  Essential hypertension -elevated, increase losartan dose to 163m -Encouraged heart healthy diet and exercise as tolerated.   Hyperlipidemia -Tolerating statin -Encouraged heart healthy diet, avoid trans fats, minimize simple carbs and saturated fats.  -Increase exercise as tolerated  Obstructive Sleep Apnea -Compliant with CPAP, continue to use  Lab Work Ordered -CBC, CMP, Lipid panel, A1c  Preventative -Pneumovax  DVT -Resolved, tolerating Xarelto -On 4th month of medication -Consider stopping xarelto and restarting aspirin  High Sugars -Discussed eating habits, recommend decreasing caloric intake -will monitor sugar levels  Annual Physical Exam -Patient encouraged to maintain heart healthy diet, regular exercise, adequate sleep. -Consider daily probiotics. Take medications as prescribed. Labs ordered and reviewed

## 2015-04-20 NOTE — Patient Instructions (Signed)
Hypertension Hypertension, commonly called high blood pressure, is when the force of blood pumping through your arteries is too strong. Your arteries are the blood vessels that carry blood from your heart throughout your body. A blood pressure reading consists of a higher number over a lower number, such as 110/72. The higher number (systolic) is the pressure inside your arteries when your heart pumps. The lower number (diastolic) is the pressure inside your arteries when your heart relaxes. Ideally you want your blood pressure below 120/80. Hypertension forces your heart to work harder to pump blood. Your arteries may become narrow or stiff. Having untreated or uncontrolled hypertension can cause heart attack, stroke, kidney disease, and other problems. RISK FACTORS Some risk factors for high blood pressure are controllable. Others are not.  Risk factors you cannot control include:   Race. You may be at higher risk if you are African American.  Age. Risk increases with age.  Gender. Men are at higher risk than women before age 50 years. After age 25, women are at higher risk than men. Risk factors you can control include:  Not getting enough exercise or physical activity.  Being overweight.  Getting too much fat, sugar, calories, or salt in your diet.  Drinking too much alcohol. SIGNS AND SYMPTOMS Hypertension does not usually cause signs or symptoms. Extremely high blood pressure (hypertensive crisis) may cause headache, anxiety, shortness of breath, and nosebleed. DIAGNOSIS To check if you have hypertension, your health care provider will measure your blood pressure while you are seated, with your arm held at the level of your heart. It should be measured at least twice using the same arm. Certain conditions can cause a difference in blood pressure between your right and left arms. A blood pressure reading that is higher than normal on one occasion does not mean that you need treatment. If  it is not clear whether you have high blood pressure, you may be asked to return on a different day to have your blood pressure checked again. Or, you may be asked to monitor your blood pressure at home for 1 or more weeks. TREATMENT Treating high blood pressure includes making lifestyle changes and possibly taking medicine. Living a healthy lifestyle can help lower high blood pressure. You may need to change some of your habits. Lifestyle changes may include:  Following the DASH diet. This diet is high in fruits, vegetables, and whole grains. It is low in salt, red meat, and added sugars.  Keep your sodium intake below 2,300 mg per day.  Getting at least 30-45 minutes of aerobic exercise at least 4 times per week.  Losing weight if necessary.  Not smoking.  Limiting alcoholic beverages.  Learning ways to reduce stress. Your health care provider may prescribe medicine if lifestyle changes are not enough to get your blood pressure under control, and if one of the following is true:  You are 62-65 years of age and your systolic blood pressure is above 140.  You are 6 years of age or older, and your systolic blood pressure is above 150.  Your diastolic blood pressure is above 90.  You have diabetes, and your systolic blood pressure is over 160 or your diastolic blood pressure is over 90.  You have kidney disease and your blood pressure is above 140/90.  You have heart disease and your blood pressure is above 140/90. Your personal target blood pressure may vary depending on your medical conditions, your age, and other factors. HOME CARE INSTRUCTIONS  Have your blood pressure rechecked as directed by your health care provider.   Take medicines only as directed by your health care provider. Follow the directions carefully. Blood pressure medicines must be taken as prescribed. The medicine does not work as well when you skip doses. Skipping doses also puts you at risk for  problems.  Do not smoke.   Monitor your blood pressure at home as directed by your health care provider. SEEK MEDICAL CARE IF:   You think you are having a reaction to medicines taken.  You have recurrent headaches or feel dizzy.  You have swelling in your ankles.  You have trouble with your vision. SEEK IMMEDIATE MEDICAL CARE IF:  You develop a severe headache or confusion.  You have unusual weakness, numbness, or feel faint.  You have severe chest or abdominal pain.  You vomit repeatedly.  You have trouble breathing. MAKE SURE YOU:   Understand these instructions.  Will watch your condition.  Will get help right away if you are not doing well or get worse.   This information is not intended to replace advice given to you by your health care provider. Make sure you discuss any questions you have with your health care provider.   Document Released: 04/29/2005 Document Revised: 09/13/2014 Document Reviewed: 02/19/2013 Elsevier Interactive Patient Education [employer].

## 2015-04-20 NOTE — Progress Notes (Signed)
Pre visit review using our clinic review tool, if applicable. No additional management support is needed unless otherwise documented below in the visit note.

## 2015-04-21 LAB — HEPATITIS C ANTIBODY: HCV AB: NEGATIVE

## 2015-04-23 NOTE — Assessment & Plan Note (Signed)
Tolerating statin, encouraged heart healthy diet, avoid trans fats, minimize simple carbs and saturated fats. Increase exercise as tolerated 

## 2015-04-23 NOTE — Assessment & Plan Note (Signed)
Poorly controlled will alter medications, encouraged DASH diet, minimize caffeine and obtain adequate sleep. Report concerning symptoms and follow up as directed and as needed. Increase Losartan

## 2015-04-23 NOTE — Assessment & Plan Note (Signed)
minimize simple carbs. Increase exercise as tolerated.  

## 2015-04-23 NOTE — Assessment & Plan Note (Signed)
Encouraged DASH diet, decrease po intake and increase exercise as tolerated. Needs 7-8 hours of sleep nightly. Avoid trans fats, eat small, frequent meals every 4-5 hours with lean proteins, complex carbs and healthy fats. Minimize simple carbs, GMO foods.

## 2015-04-23 NOTE — Assessment & Plan Note (Signed)
Uses CPAP nightly

## 2015-04-23 NOTE — Progress Notes (Signed)
Subjective:    Patient ID: Christian [name], male    DOB: November 15, 1945, 69 y.o.   MRN: [id number]  Chief Complaint  Patient presents with  . Follow-up    HPI Patient is in today for follow-up. He feels well. No recent illness. He has been to dermatology since his last visit and some precancerous lesions were removed but no cancer was found. Denies any acute illness or emergency room visits. Tries to remain active and maintain a heart healthy diet. Denies CP/palp/SOB/HA/congestion/fevers/GI or GU c/o. Taking meds as prescribed  Past Medical History  Diagnosis Date  . DVT of lower limb, acute ([place]) 01/13/2015  . History of chicken pox 01/13/2015  . Hypokalemia 01/13/2015  . Obesity   . High cholesterol   . Hyperlipidemia, mild 01/13/2015  . High blood pressure     No past surgical history on file.  Family History  Problem Relation Age of Onset  . Stroke Father     from surgery  . Emphysema Father     smoker  . Heart disease Father     carotid artery disease, vasculopathy  . Allergic Disorder Sister     Social History   Social History  . Marital Status: Married    Spouse Name: N/A  . Number of Children: 1  . Years of Education: N/A   Occupational History  . Retired    Social History Main Topics  . Smoking status: Never Smoker   . Smokeless tobacco: Not on file  . Alcohol Use: 0.0 oz/week    0 Standard drinks or equivalent per week     Comment: 2-3 glasses of wine every night  . Drug Use: No  . 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 Not on file   Social History Narrative    Outpatient Prescriptions Prior to Visit  Medication Sig Dispense Refill  . hydrochlorothiazide (HYDRODIURIL) 25 MG tablet   3  . Multiple Vitamin (MULTIVITAMIN) tablet Take 1 tablet by mouth daily.    . pravastatin (PRAVACHOL) 40 MG tablet   3  . Rivaroxaban (XARELTO) 15 MG TABS tablet Take 1  tablet (15 mg total) by mouth 2 (two) times daily with a meal. 60 tablet 2  . vitamin C (ASCORBIC ACID) 500 MG tablet Take 500 mg by mouth daily.    [name] losartan (COZAAR) 50 MG tablet   3  . aspirin 81 MG tablet Take 81 mg by mouth daily.    . Azelastine-Fluticasone (DYMISTA) 137-50 MCG/ACT SUSP Place 2 sprays into both nostrils at bedtime. (Patient not taking: Reported on 04/20/2015) 1 Bottle 0   No facility-administered medications prior to visit.    Allergies  Allergen Reactions  . Codeine Anaphylaxis    Review of Systems  Constitutional: Negative for fever and malaise/fatigue.  HENT: Negative for congestion.   Eyes: Negative for discharge.  Respiratory: Negative for shortness of breath.   Cardiovascular: Negative for chest pain, palpitations and leg swelling.  Gastrointestinal: Negative for nausea and abdominal pain.  Genitourinary: Negative for dysuria.  Musculoskeletal: Negative for falls.  Skin: Negative for rash.  Neurological: Negative for loss of consciousness and headaches.  Endo/Heme/Allergies: Negative for environmental allergies.  Psychiatric/Behavioral: Negative for depression. The patient is not nervous/anxious.        Objective:    Physical Exam  Constitutional: He is oriented to person, place, and time. He appears well-developed  and well-nourished. No distress.  HENT:  Head: Normocephalic and atraumatic.  Nose: Nose normal.  Eyes: Right eye exhibits no discharge. Left eye exhibits no discharge.  Neck: Normal range of motion. Neck supple.  Cardiovascular: Normal rate and regular rhythm.   No murmur heard. Pulmonary/Chest: Effort normal and breath sounds normal.  Abdominal: Soft. Bowel sounds are normal. There is no tenderness.  Musculoskeletal: He exhibits no edema.  Neurological: He is alert and oriented to person, place, and time.  Skin: Skin is warm and dry.  Psychiatric: He has a normal mood and affect.  Nursing note and vitals reviewed.   BP 148/78  mmHg  Pulse 78  Temp(Src) 98.6 F (37 C) (Oral)  Ht _0  (1.753 m)  Wt 232 lb (105.235 kg)  BMI 34.24 kg/m2  SpO2 98% Wt Readings from Last 3 Encounters:  04/20/15 232 lb (105.235 kg)  02/09/15 229 lb 6.4 oz (104.055 kg)  01/13/15 227 lb 2 oz (103.023 kg)     Lab Results  Component Value Date   WBC 7.8 04/20/2015   HGB 15.1 04/20/2015   HCT 45.1 04/20/2015   PLT 256.0 04/20/2015   GLUCOSE 90 04/20/2015   CHOL 161 04/20/2015   TRIG 139.0 04/20/2015   HDL 46.10 04/20/2015   LDLCALC 87 04/20/2015   ALT 55* 04/20/2015   AST 36 04/20/2015   NA 138 04/20/2015   K 3.6 04/20/2015   CL 102 04/20/2015   CREATININE 0.78 04/20/2015   BUN 12 04/20/2015   CO2 27 04/20/2015   TSH 1.92 04/20/2015   HGBA1C 5.7 04/20/2015    Lab Results  Component Value Date   TSH 1.92 04/20/2015   Lab Results  Component Value Date   WBC 7.8 04/20/2015   HGB 15.1 04/20/2015   HCT 45.1 04/20/2015   MCV 94.2 04/20/2015   PLT 256.0 04/20/2015   Lab Results  Component Value Date   NA 138 04/20/2015   K 3.6 04/20/2015   CO2 27 04/20/2015   GLUCOSE 90 04/20/2015   BUN 12 04/20/2015   CREATININE 0.78 04/20/2015   BILITOT 0.5 04/20/2015   ALKPHOS 60 04/20/2015   AST 36 04/20/2015   ALT 55* 04/20/2015   PROT 8.1 04/20/2015   ALBUMIN 4.4 04/20/2015   CALCIUM 10.2 04/20/2015   ANIONGAP 10 12/21/2014   GFR 104.51 04/20/2015   Lab Results  Component Value Date   CHOL 161 04/20/2015   Lab Results  Component Value Date   HDL 46.10 04/20/2015   Lab Results  Component Value Date   LDLCALC 87 04/20/2015   Lab Results  Component Value Date   TRIG 139.0 04/20/2015   Lab Results  Component Value Date   CHOLHDL 3 04/20/2015   Lab Results  Component Value Date   HGBA1C 5.7 04/20/2015       Assessment & Plan:   Problem List Items Addressed This Visit    Benign essential HTN    Poorly controlled will alter medications, encouraged DASH diet, minimize caffeine and obtain adequate  sleep. Report concerning symptoms and follow up as directed and as needed. Increase Losartan      Relevant Medications   losartan (COZAAR) 100 MG tablet   Other Relevant Orders   CBC (Completed)   TSH (Completed)   Comprehensive metabolic panel (Completed)   Lipid panel (Completed)   Hemoglobin A1c (Completed)   Hyperglycemia    minimize simple carbs. Increase exercise as tolerated.       Relevant Orders  CBC (Completed)   TSH (Completed)   Comprehensive metabolic panel (Completed)   Lipid panel (Completed)   Hemoglobin A1c (Completed)   Hyperlipidemia, mild    Tolerating statin, encouraged heart healthy diet, avoid trans fats, minimize simple carbs and saturated fats. Increase exercise as tolerated      Relevant Medications   losartan (COZAAR) 100 MG tablet   Other Relevant Orders   CBC (Completed)   TSH (Completed)   Comprehensive metabolic panel (Completed)   Lipid panel (Completed)   Hemoglobin A1c (Completed)   Hypokalemia - Primary   Relevant Orders   CBC (Completed)   TSH (Completed)   Comprehensive metabolic panel (Completed)   Lipid panel (Completed)   Hemoglobin A1c (Completed)   Obesity    Encouraged DASH diet, decrease po intake and increase exercise as tolerated. Needs 7-8 hours of sleep nightly. Avoid trans fats, eat small, frequent meals every 4-5 hours with lean proteins, complex carbs and healthy fats. Minimize simple carbs, GMO foods.      Relevant Orders   CBC (Completed)   TSH (Completed)   Comprehensive metabolic panel (Completed)   Lipid panel (Completed)   Hemoglobin A1c (Completed)   Obstructive sleep apnea    Uses CPAP nightly       Other Visit Diagnoses    Screening for viral disease        Relevant Orders    Hepatitis C antibody (Completed)    Need for 23-polyvalent pneumococcal polysaccharide vaccine        Relevant Orders    Pneumococcal polysaccharide vaccine 23-valent greater than or equal to 2yo subcutaneous/IM (Completed)        I have discontinued Mr. Kise's losartan. I am also having him start on losartan. Additionally, I am having him maintain his hydrochlorothiazide, pravastatin, aspirin, multivitamin, vitamin C, Rivaroxaban, and Azelastine-Fluticasone.  Meds ordered this encounter  Medications  . losartan (COZAAR) 100 MG tablet    Sig: Take 1 tablet (100 mg total) by mouth daily.    Dispense:  90 tablet    Refill:  1     Penni Homans, MD

## 2015-05-06 ENCOUNTER — Encounter: Payer: Self-pay | Admitting: Family Medicine

## 2015-05-06 ENCOUNTER — Ambulatory Visit (INDEPENDENT_AMBULATORY_CARE_PROVIDER_SITE_OTHER): Payer: Medicare Other | Admitting: Family Medicine

## 2015-05-06 VITALS — BP 120/72 | HR 75 | Temp 98.4°F | Ht 69.0 in | Wt 235.5 lb

## 2015-05-06 DIAGNOSIS — R197 Diarrhea, unspecified: Secondary | ICD-10-CM | POA: Diagnosis not present

## 2015-05-06 DIAGNOSIS — K219 Gastro-esophageal reflux disease without esophagitis: Secondary | ICD-10-CM | POA: Diagnosis not present

## 2015-05-06 MED ORDER — OMEPRAZOLE 20 MG PO CPDR: 20.0000 mg | DELAYED_RELEASE_CAPSULE | Freq: Every day | ORAL | Status: DC

## 2015-05-06 NOTE — Patient Instructions (Signed)
Diarrhea Diarrhea is frequent loose and watery bowel movements. It can cause you to feel weak and dehydrated. Dehydration can cause you to become tired and thirsty, have a dry mouth, and have decreased urination that often is dark yellow. Diarrhea is a sign of another problem, most often an infection that will not last long. In most cases, diarrhea typically lasts 2-3 days. However, it can last longer if it is a sign of something more serious. It is important to treat your diarrhea as directed by your caregiver to lessen or prevent future episodes of diarrhea. CAUSES  Some common causes include:  Gastrointestinal infections caused by viruses, bacteria, or parasites.  Food poisoning or food allergies.  Certain medicines, such as antibiotics, chemotherapy, and laxatives.  Artificial sweeteners and fructose.  Digestive disorders. HOME CARE INSTRUCTIONS  Ensure adequate fluid intake (hydration): Have 1 cup (8 oz) of fluid for each diarrhea episode. Avoid fluids that contain simple sugars or sports drinks, fruit juices, whole milk products, and sodas. Your urine should be clear or pale yellow if you are drinking enough fluids. Hydrate with an oral rehydration solution that you can purchase at pharmacies, retail stores, and online. You can prepare an oral rehydration solution at home by mixing the following ingredients together:   - tsp table salt.   tsp baking soda.   tsp salt substitute containing potassium chloride.  1  tablespoons sugar.  1 L (34 oz) of water.  Certain foods and beverages may increase the speed at which food moves through the gastrointestinal (GI) tract. These foods and beverages should be avoided and include:  Caffeinated and alcoholic beverages.  High-fiber foods, such as raw fruits and vegetables, nuts, seeds, and whole grain breads and cereals.  Foods and beverages sweetened with sugar alcohols, such as xylitol, sorbitol, and mannitol.  Some foods may be well  tolerated and may help thicken stool including:  Starchy foods, such as rice, toast, pasta, low-sugar cereal, oatmeal, grits, baked potatoes, crackers, and bagels.  Bananas.  Applesauce.  Add probiotic-rich foods to help increase healthy bacteria in the GI tract, such as yogurt and fermented milk products.  Wash your hands well after each diarrhea episode.  Only take over-the-counter or prescription medicines as directed by your caregiver.  Take a warm bath to relieve any burning or pain from frequent diarrhea episodes. SEEK IMMEDIATE MEDICAL CARE IF:   You are unable to keep fluids down.  You have persistent vomiting.  You have blood in your stool, or your stools are black and tarry.  You do not urinate in 6-8 hours, or there is only a small amount of very dark urine.  You have abdominal pain that increases or localizes.  You have weakness, dizziness, confusion, or light-headedness.  You have a severe headache.  Your diarrhea gets worse or does not get better.  You have a fever or persistent symptoms for more than 2-3 days.  You have a fever and your symptoms suddenly get worse. MAKE SURE YOU:   Understand these instructions.  Will watch your condition.  Will get help right away if you are not doing well or get worse.   This information is not intended to replace advice given to you by your health care provider. Make sure you discuss any questions you have with your health care provider.   Document Released: 04/19/2002 Document Revised: 05/20/2014 Document Reviewed: 01/05/2012 Elsevier Interactive Patient Education [employer].

## 2015-05-06 NOTE — Progress Notes (Signed)
Pre visit review using our clinic review tool, if applicable. No additional management support is needed unless otherwise documented below in the visit note.

## 2015-05-06 NOTE — Progress Notes (Signed)
   Subjective:    Patient ID: Christian [name], male    DOB: 05-23-1945, 69 y.o.   MRN: [id number]  HPI Pt here with sudden onset diarrhea yesterday-- woke up in early am and had to run to bathroom with loose stool.  Had one other episode yesterday.  + nausea, no vomiting.  No appetite.  Today he is feeling better and ate.  Back hurt yesterday    Review of Systems  Constitutional: Negative for diaphoresis, appetite change, fatigue and unexpected weight change.  Eyes: Negative for pain, redness and visual disturbance.  Respiratory: Negative for cough, chest tightness, shortness of breath and wheezing.   Cardiovascular: Negative for chest pain, palpitations and leg swelling.  Gastrointestinal: Positive for diarrhea. Negative for nausea.  Endocrine: Negative for cold intolerance, heat intolerance, polydipsia, polyphagia and polyuria.  Genitourinary: Negative for dysuria, frequency and difficulty urinating.  Neurological: Negative for dizziness, light-headedness, numbness and headaches.       Objective:   Physical Exam  Constitutional: He is oriented to person, place, and time. Vital signs are normal. He appears well-developed and well-nourished. He is sleeping.  HENT:  Head: Normocephalic and atraumatic.  Mouth/Throat: Oropharynx is clear and moist.  Eyes: EOM are normal. Pupils are equal, round, and reactive to light.  Neck: Normal range of motion. Neck supple. No thyromegaly present.  Cardiovascular: Normal rate and regular rhythm.   No murmur heard. Pulmonary/Chest: Effort normal and breath sounds normal. No respiratory distress. He has no wheezes. He has no rales. He exhibits no tenderness.  Abdominal: There is no tenderness. There is no rebound.  Musculoskeletal: He exhibits no edema or tenderness.  Neurological: He is alert and oriented to person, place, and time.  Skin: Skin is warm and dry.  Psychiatric: He has a normal mood and affect. His behavior is normal. Judgment and thought  content normal.  Nursing note and vitals reviewed.         Assessment & Plan:

## 2015-05-06 NOTE — Assessment & Plan Note (Signed)
Symptoms almost completely resolved Check stool cards rto on tues for labs

## 2015-05-18 ENCOUNTER — Ambulatory Visit (INDEPENDENT_AMBULATORY_CARE_PROVIDER_SITE_OTHER): Payer: Medicare Other | Admitting: Family Medicine

## 2015-05-18 VITALS — BP 115/60 | HR 66 | Wt 226.6 lb

## 2015-05-18 DIAGNOSIS — I10 Essential (primary) hypertension: Secondary | ICD-10-CM

## 2015-05-18 NOTE — Patient Instructions (Signed)
Continue same dose of BP medications and return in 3-6 months

## 2015-05-18 NOTE — Progress Notes (Signed)
RN blood check note reviewed. Agree with documention and plan.

## 2015-05-18 NOTE — Progress Notes (Signed)
Pre visit review using our clinic review tool, if applicable. No additional management support is needed unless otherwise documented below in the visit note.  Patient in for BP check today. Patient states he woke up this am with heartburn for the first time in a long time but believes it was from something he ate last p.m. Denies SOB,Chest pain and dizziness.  BP today =

## 2015-05-26 ENCOUNTER — Other Ambulatory Visit: Payer: Medicare Other

## 2015-05-29 ENCOUNTER — Ambulatory Visit (HOSPITAL_BASED_OUTPATIENT_CLINIC_OR_DEPARTMENT_OTHER)
Admission: RE | Admit: 2015-05-29 | Discharge: 2015-05-29 | Disposition: A | Discharge status: Home / Self Care | Payer: Medicare Other | Source: Ambulatory Visit | Attending: Family Medicine | Admitting: Family Medicine

## 2015-05-29 ENCOUNTER — Ambulatory Visit (INDEPENDENT_AMBULATORY_CARE_PROVIDER_SITE_OTHER): Payer: Medicare Other | Admitting: Family Medicine

## 2015-05-29 ENCOUNTER — Other Ambulatory Visit: Payer: Medicare Other

## 2015-05-29 ENCOUNTER — Encounter: Payer: Self-pay | Admitting: Family Medicine

## 2015-05-29 VITALS — BP 120/78 | HR 72 | Temp 98.8°F | Ht 69.0 in | Wt 225.5 lb

## 2015-05-29 DIAGNOSIS — K625 Hemorrhage of anus and rectum: Secondary | ICD-10-CM

## 2015-05-29 DIAGNOSIS — E669 Obesity, unspecified: Secondary | ICD-10-CM

## 2015-05-29 DIAGNOSIS — M5136 Other intervertebral disc degeneration, lumbar region: Secondary | ICD-10-CM | POA: Diagnosis not present

## 2015-05-29 DIAGNOSIS — I10 Essential (primary) hypertension: Secondary | ICD-10-CM | POA: Diagnosis not present

## 2015-05-29 DIAGNOSIS — M545 Low back pain: Secondary | ICD-10-CM | POA: Diagnosis not present

## 2015-05-29 DIAGNOSIS — R109 Unspecified abdominal pain: Secondary | ICD-10-CM

## 2015-05-29 DIAGNOSIS — M5489 Other dorsalgia: Secondary | ICD-10-CM

## 2015-05-29 DIAGNOSIS — E785 Hyperlipidemia, unspecified: Secondary | ICD-10-CM

## 2015-05-29 DIAGNOSIS — R739 Hyperglycemia, unspecified: Secondary | ICD-10-CM

## 2015-05-29 DIAGNOSIS — M47896 Other spondylosis, lumbar region: Secondary | ICD-10-CM | POA: Insufficient documentation

## 2015-05-29 LAB — URINALYSIS WITH CULTURE, IF INDICATED
BILIRUBIN URINE: NEGATIVE
HGB URINE DIPSTICK: NEGATIVE
Ketones, ur: NEGATIVE
LEUKOCYTES UA: NEGATIVE
Nitrite: NEGATIVE
RBC / HPF: NONE SEEN (ref 0–?)
Specific Gravity, Urine: 1.015 (ref 1.000–1.030)
TOTAL PROTEIN, URINE-UPE24: NEGATIVE
URINE GLUCOSE: NEGATIVE
Urobilinogen, UA: 0.2 (ref 0.0–1.0)
WBC, UA: NONE SEEN (ref 0–?)
pH: 7 (ref 5.0–8.0)

## 2015-05-29 LAB — COMPREHENSIVE METABOLIC PANEL
ALBUMIN: 4.4 g/dL (ref 3.5–5.2)
ALT: 38 U/L (ref 0–53)
AST: 27 U/L (ref 0–37)
Alkaline Phosphatase: 59 U/L (ref 39–117)
BUN: 12 mg/dL (ref 6–23)
CHLORIDE: 103 meq/L (ref 96–112)
CO2: 28 meq/L (ref 19–32)
Calcium: 10 mg/dL (ref 8.4–10.5)
Creatinine, Ser: 0.9 mg/dL (ref 0.40–1.50)
GFR: 88.57 mL/min (ref >60.00)
Glucose, Bld: 90 mg/dL (ref 70–99)
Potassium: 3.7 mEq/L (ref 3.5–5.1)
Sodium: 140 mEq/L (ref 135–145)
Total Bilirubin: 0.6 mg/dL (ref 0.2–1.2)
Total Protein: 8 g/dL (ref 6.0–8.3)

## 2015-05-29 LAB — CBC
HCT: 43.4 % (ref 39.0–52.0)
Hemoglobin: 14.3 g/dL (ref 13.0–17.0)
MCHC: 32.9 g/dL (ref 30.0–36.0)
MCV: 93.8 fl (ref 78.0–100.0)
PLATELETS: 280 10*3/uL (ref 150.0–400.0)
RBC: 4.63 Mil/uL (ref 4.22–5.81)
RDW: 12.9 % (ref 11.5–15.5)
WBC: 6 10*3/uL (ref 4.0–10.5)

## 2015-05-29 MED ORDER — CYCLOBENZAPRINE HCL 5 MG PO TABS: 2.5000 mg | ORAL_TABLET | Freq: Every evening | ORAL | Status: DC | PRN

## 2015-05-29 NOTE — Patient Instructions (Addendum)
salonpas and asperceme   Back Pain, Adult Back pain is very common in adults.The cause of back pain is rarely dangerous and the pain often gets better over time.The cause of your back pain may not be known. Some common causes of back pain include:  Strain of the muscles or ligaments supporting the spine.  Wear and tear (degeneration) of the spinal disks.  Arthritis.  Direct injury to the back. For many people, back pain may return. Since back pain is rarely dangerous, most people can learn to manage this condition on their own. HOME CARE INSTRUCTIONS Watch your back pain for any changes. The following actions may help to lessen any discomfort you are feeling:  Remain active. It is stressful on your back to sit or stand in one place for long periods of time. Do not sit, drive, or stand in one place for more than 30 minutes at a time. Take short walks on even surfaces as soon as you are able.Try to increase the length of time you walk each day.  Exercise regularly as directed by your health care provider. Exercise helps your back heal faster. It also helps avoid future injury by keeping your muscles strong and flexible.  Do not stay in bed.Resting more than 1-2 days can delay your recovery.  Pay attention to your body when you bend and lift. The most comfortable positions are those that put less stress on your recovering back. Always use proper lifting techniques, including:  Bending your knees.  Keeping the load close to your body.  Avoiding twisting.  Find a comfortable position to sleep. Use a firm mattress and lie on your side with your knees slightly bent. If you lie on your back, put a pillow under your knees.  Avoid feeling anxious or stressed.Stress increases muscle tension and can worsen back pain.It is important to recognize when you are anxious or stressed and learn ways to manage it, such as with exercise.  Take medicines only as directed by your health care  provider. Over-the-counter medicines to reduce pain and inflammation are often the most helpful.Your health care provider may prescribe muscle relaxant drugs.These medicines help dull your pain so you can more quickly return to your normal activities and healthy exercise.  Apply ice to the injured area:  Put ice in a plastic bag.  Place a towel between your skin and the bag.  Leave the ice on for 20 minutes, 2-3 times a day for the first 2-3 days. After that, ice and heat may be alternated to reduce pain and spasms.  Maintain a healthy weight. Excess weight puts extra stress on your back and makes it difficult to maintain good posture. SEEK MEDICAL CARE IF:  You have pain that is not relieved with rest or medicine.  You have increasing pain going down into the legs or buttocks.  You have pain that does not improve in one week.  You have night pain.  You lose weight.  You have a fever or chills. SEEK IMMEDIATE MEDICAL CARE IF:   You develop new bowel or bladder control problems.  You have unusual weakness or numbness in your arms or legs.  You develop nausea or vomiting.  You develop abdominal pain.  You feel faint.   This information is not intended to replace advice given to you by your health care provider. Make sure you discuss any questions you have with your health care provider.   Document Released: 04/29/2005 Document Revised: 05/20/2014 Document Reviewed: 08/31/2013 Elsevier  Interactive Patient Education [employer].

## 2015-05-29 NOTE — Progress Notes (Signed)
Subjective:    Patient ID: Christian [name], male    DOB: 27-Apr-1946, 70 y.o.   MRN: [id number]  Chief Complaint  Patient presents with  . Back Pain    HPI Patient is in today for follow-up complaining of some back and mild abdominal pain. No falls or injury. Majority of the pain is on the right-hand side. Denies any change in urinary or bowel patterns. Bowels move daily last colonoscopy 2007 with [facility]. No urinary complaints such as incontinence or dysuria. Is abstaining from alcohol this time and has not had any for the last month. Avoids red meat and has been trying to increase his vegetable intake. Had influenza in December but is recovered from that. Had a blood clot back in September but is recovering well from that as well. Finally did suffer a small burn at home but this was looked at by dermatology and treated there. Denies CP/palp/SOB/HA/congestion/fevers/GI or GU c/o. Taking meds as prescribed  Past Medical History  Diagnosis Date  . DVT of lower limb, acute ([place]) 01/13/2015  . History of chicken pox 01/13/2015  . Hypokalemia 01/13/2015  . Obesity   . High cholesterol   . Hyperlipidemia, mild 01/13/2015  . High blood pressure   . Back pain 06/04/2015    History reviewed. No pertinent past surgical history.  Family History  Problem Relation Age of Onset  . Stroke Father     from surgery  . Emphysema Father     smoker  . Heart disease Father     carotid artery disease, vasculopathy  . Allergic Disorder Sister     Social History   Social History  . Marital Status: Married    Spouse Name: N/A  . Number of Children: 1  . Years of Education: N/A   Occupational History  . Retired    Social History Main Topics  . Smoking status: Never Smoker   . Smokeless tobacco: Not on file  . Alcohol Use: 0.0 oz/week    0 Standard drinks or equivalent per week     Comment: 2-3 glasses of wine every night  . Drug Use: No  . 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 Not on file   Social History Narrative    Outpatient Prescriptions Prior to Visit  Medication Sig Dispense Refill  . aspirin 81 MG tablet Take 81 mg by mouth daily. Reported on 05/06/2015    . Azelastine-Fluticasone (DYMISTA) 137-50 MCG/ACT SUSP Place 2 sprays into both nostrils at bedtime. 1 Bottle 0  . hydrochlorothiazide (HYDRODIURIL) 25 MG tablet   3  . losartan (COZAAR) 100 MG tablet Take 1 tablet (100 mg total) by mouth daily. 90 tablet 1  . Multiple Vitamin (MULTIVITAMIN) tablet Take 1 tablet by mouth daily.    [name] omeprazole (PRILOSEC) 20 MG capsule Take 1 capsule (20 mg total) by mouth daily. 30 capsule 3  . pravastatin (PRAVACHOL) 40 MG tablet   3  . vitamin C (ASCORBIC ACID) 500 MG tablet Take 500 mg by mouth daily.     No facility-administered medications prior to visit.    Allergies  Allergen Reactions  . Codeine Anaphylaxis    Review of Systems  Constitutional: Negative for fever, chills and malaise/fatigue.  HENT: Negative for congestion and hearing loss.   Eyes: Negative for discharge.  Respiratory: Negative for cough, sputum production and shortness of breath.  Cardiovascular: Negative for chest pain, palpitations and leg swelling.  Gastrointestinal: Positive for abdominal pain. Negative for heartburn, nausea, vomiting, diarrhea, constipation and blood in stool.  Genitourinary: Negative for dysuria, urgency, frequency and hematuria.  Musculoskeletal: Positive for back pain. Negative for myalgias and falls.  Skin: Negative for rash.  Neurological: Negative for dizziness, sensory change, loss of consciousness, weakness and headaches.  Endo/Heme/Allergies: Negative for environmental allergies. Does not bruise/bleed easily.  Psychiatric/Behavioral: Negative for depression and suicidal ideas. The patient is not nervous/anxious and does not have insomnia.        Objective:      Physical Exam  Constitutional: He is oriented to person, place, and time. He appears well-developed and well-nourished. No distress.  HENT:  Head: Normocephalic and atraumatic.  Nose: Nose normal.  Eyes: Right eye exhibits no discharge. Left eye exhibits no discharge.  Neck: Normal range of motion. Neck supple.  Cardiovascular: Normal rate and regular rhythm.   No murmur heard. Pulmonary/Chest: Effort normal and breath sounds normal.  Abdominal: Soft. Bowel sounds are normal. There is no tenderness.  Musculoskeletal: He exhibits no edema.  Neurological: He is alert and oriented to person, place, and time.  Skin: Skin is warm and dry.  Psychiatric: He has a normal mood and affect.  Nursing note and vitals reviewed.   BP 120/78 mmHg  Pulse 72  Temp(Src) 98.8 F (37.1 C) (Oral)  Ht _0  (1.753 m)  Wt 225 lb 8 oz (102.286 kg)  BMI 33.29 kg/m2  SpO2 96% Wt Readings from Last 3 Encounters:  05/29/15 225 lb 8 oz (102.286 kg)  05/18/15 226 lb 9.6 oz (102.785 kg)  05/06/15 235 lb 8 oz (106.822 kg)     Lab Results  Component Value Date   WBC 6.0 05/29/2015   HGB 14.3 05/29/2015   HCT 43.4 05/29/2015   PLT 280.0 05/29/2015   GLUCOSE 90 05/29/2015   CHOL 161 04/20/2015   TRIG 139.0 04/20/2015   HDL 46.10 04/20/2015   LDLCALC 87 04/20/2015   ALT 38 05/29/2015   AST 27 05/29/2015   NA 140 05/29/2015   K 3.7 05/29/2015   CL 103 05/29/2015   CREATININE 0.90 05/29/2015   BUN 12 05/29/2015   CO2 28 05/29/2015   TSH 1.92 04/20/2015   HGBA1C 5.7 04/20/2015    Lab Results  Component Value Date   TSH 1.92 04/20/2015   Lab Results  Component Value Date   WBC 6.0 05/29/2015   HGB 14.3 05/29/2015   HCT 43.4 05/29/2015   MCV 93.8 05/29/2015   PLT 280.0 05/29/2015   Lab Results  Component Value Date   NA 140 05/29/2015   K 3.7 05/29/2015   CO2 28 05/29/2015   GLUCOSE 90 05/29/2015   BUN 12 05/29/2015   CREATININE 0.90 05/29/2015   BILITOT 0.6 05/29/2015   ALKPHOS  59 05/29/2015   AST 27 05/29/2015   ALT 38 05/29/2015   PROT 8.0 05/29/2015   ALBUMIN 4.4 05/29/2015   CALCIUM 10.0 05/29/2015   ANIONGAP 10 12/21/2014   GFR 88.57 05/29/2015   Lab Results  Component Value Date   CHOL 161 04/20/2015   Lab Results  Component Value Date   HDL 46.10 04/20/2015   Lab Results  Component Value Date   LDLCALC 87 04/20/2015   Lab Results  Component Value Date   TRIG 139.0 04/20/2015   Lab Results  Component Value Date   CHOLHDL 3 04/20/2015   Lab Results  Component Value Date  HGBA1C 5.7 04/20/2015       Assessment & Plan:   Problem List Items Addressed This Visit    Back pain    Referred to PT. Try topical treatments, may continue current meds prn      Relevant Medications   cyclobenzaprine (FLEXERIL) 5 MG tablet   Other Relevant Orders   DG Lumbar Spine Complete (Completed)   POC Hemoccult Bld/Stl (3-Cd Home Screen)   Benign essential HTN    Well controlled. Encouraged heart healthy diet such as the DASH diet and exercise as tolerated.       Hyperglycemia    minimize simple carbs. Increase exercise as tolerated. Continue current meds      Hyperlipidemia, mild    Tolerating statin, encouraged heart healthy diet, avoid trans fats, minimize simple carbs and saturated fats. Increase exercise as tolerated      Obesity    Encouraged DASH diet, decrease po intake and increase exercise as tolerated. Needs 7-8 hours of sleep nightly. Avoid trans fats, eat small, frequent meals every 4-5 hours with lean proteins, complex carbs and healthy fats. Minimize simple carbs       Other Visit Diagnoses    Abdominal pain, unspecified abdominal location    -  Primary    Relevant Orders    CBC (Completed)    Urinalysis with Culture, if indicated (Completed)    Comprehensive metabolic panel (Completed)    Ambulatory referral to Physical Therapy    POC Hemoccult Bld/Stl (3-Cd Home Screen)    Rectal bleeding        Relevant Orders    Fecal  occult blood, imunochemical       I am having Mr. Filkins start on cyclobenzaprine. I am also having him maintain his hydrochlorothiazide, pravastatin, aspirin, multivitamin, vitamin C, Azelastine-Fluticasone, losartan, and omeprazole.  Meds ordered this encounter  Medications  . cyclobenzaprine (FLEXERIL) 5 MG tablet    Sig: Take 0.5-1 tablets (2.5-5 mg total) by mouth at bedtime as needed for muscle spasms.    Dispense:  30 tablet    Refill:  1     Willette Alma, MD

## 2015-05-29 NOTE — Progress Notes (Signed)
Pre visit review using our clinic review tool, if applicable. No additional management support is needed unless otherwise documented below in the visit note.

## 2015-06-01 DIAGNOSIS — M545 Low back pain: Secondary | ICD-10-CM | POA: Diagnosis not present

## 2015-06-01 DIAGNOSIS — G4733 Obstructive sleep apnea (adult) (pediatric): Secondary | ICD-10-CM | POA: Diagnosis not present

## 2015-06-01 DIAGNOSIS — R109 Unspecified abdominal pain: Secondary | ICD-10-CM | POA: Diagnosis not present

## 2015-06-02 ENCOUNTER — Other Ambulatory Visit (INDEPENDENT_AMBULATORY_CARE_PROVIDER_SITE_OTHER): Payer: Medicare Other

## 2015-06-02 DIAGNOSIS — K625 Hemorrhage of anus and rectum: Secondary | ICD-10-CM | POA: Diagnosis not present

## 2015-06-02 LAB — HEMOCCULT SLIDES (X 3 CARDS)
FECAL OCCULT BLD: NEGATIVE
OCCULT 1: NEGATIVE
OCCULT 2: NEGATIVE
OCCULT 3: NEGATIVE
OCCULT 4: NEGATIVE
OCCULT 5: NEGATIVE

## 2015-06-02 NOTE — Addendum Note (Signed)
Addended by: Avie Echevaria on: 06/02/2015 03:17 PM   Modules accepted: Orders

## 2015-06-04 ENCOUNTER — Encounter: Payer: Self-pay | Admitting: Family Medicine

## 2015-06-04 DIAGNOSIS — M549 Dorsalgia, unspecified: Secondary | ICD-10-CM

## 2015-06-04 DIAGNOSIS — M545 Low back pain, unspecified: Secondary | ICD-10-CM | POA: Insufficient documentation

## 2015-06-04 HISTORY — DX: Dorsalgia, unspecified: M54.9

## 2015-06-04 NOTE — Assessment & Plan Note (Signed)
Tolerating statin, encouraged heart healthy diet, avoid trans fats, minimize simple carbs and saturated fats. Increase exercise as tolerated 

## 2015-06-04 NOTE — Assessment & Plan Note (Signed)
minimize simple carbs. Increase exercise as tolerated. Continue current meds  

## 2015-06-04 NOTE — Assessment & Plan Note (Signed)
Referred to PT. Try topical treatments, may continue current meds prn

## 2015-06-04 NOTE — Assessment & Plan Note (Signed)
Well controlled. Encouraged heart healthy diet such as the DASH diet and exercise as tolerated.

## 2015-06-04 NOTE — Assessment & Plan Note (Signed)
Encouraged DASH diet, decrease po intake and increase exercise as tolerated. Needs 7-8 hours of sleep nightly. Avoid trans fats, eat small, frequent meals every 4-5 hours with lean proteins, complex carbs and healthy fats. Minimize simple carbs 

## 2015-06-08 DIAGNOSIS — R109 Unspecified abdominal pain: Secondary | ICD-10-CM | POA: Diagnosis not present

## 2015-06-08 DIAGNOSIS — M545 Low back pain: Secondary | ICD-10-CM | POA: Diagnosis not present

## 2015-06-13 DIAGNOSIS — G4733 Obstructive sleep apnea (adult) (pediatric): Secondary | ICD-10-CM | POA: Diagnosis not present

## 2015-07-18 ENCOUNTER — Telehealth: Payer: Self-pay | Admitting: Family Medicine

## 2015-07-18 MED ORDER — HYDROCHLOROTHIAZIDE 25 MG PO TABS: 25.0000 mg | ORAL_TABLET | Freq: Every day | ORAL | Status: DC

## 2015-07-18 MED ORDER — LOSARTAN POTASSIUM 100 MG PO TABS: 100.0000 mg | ORAL_TABLET | Freq: Every day | ORAL | Status: DC

## 2015-07-18 MED ORDER — PRAVASTATIN SODIUM 40 MG PO TABS: 40.0000 mg | ORAL_TABLET | Freq: Every day | ORAL | Status: DC

## 2015-07-18 NOTE — Telephone Encounter (Signed)
Refill done and pt. Aware.

## 2015-07-18 NOTE — Telephone Encounter (Signed)
Relation to GB:EEFE Call back [place]: [id number] [place], [place] - [address] [phone](Phone) [phone](Fax)         Reason for call:  Patient requesting a refill hydrochlorothiazide (HYDRODIURIL) 25 MG tablet and pravastatin (PRAVACHOL) 40 MG tablet

## 2015-08-01 ENCOUNTER — Telehealth: Payer: Self-pay | Admitting: Family Medicine

## 2015-08-01 NOTE — Telephone Encounter (Signed)
Pt dropped off documents for provider to see ([employer] for use and disclosure of [employer]) - copy

## 2015-08-01 NOTE — Telephone Encounter (Signed)
Did we make me a copy to scan in chart and review?

## 2015-08-02 NOTE — Telephone Encounter (Signed)
Documents received and forwarded to [facility] for scan to chart/SLS 03/22

## 2015-08-08 ENCOUNTER — Ambulatory Visit (INDEPENDENT_AMBULATORY_CARE_PROVIDER_SITE_OTHER): Payer: Medicare Other | Admitting: Family Medicine

## 2015-08-08 ENCOUNTER — Encounter: Payer: Self-pay | Admitting: Family Medicine

## 2015-08-08 VITALS — BP 128/72 | HR 77 | Temp 98.5°F | Ht 69.0 in | Wt 231.5 lb

## 2015-08-08 DIAGNOSIS — E785 Hyperlipidemia, unspecified: Secondary | ICD-10-CM | POA: Diagnosis not present

## 2015-08-08 DIAGNOSIS — I10 Essential (primary) hypertension: Secondary | ICD-10-CM | POA: Diagnosis not present

## 2015-08-08 DIAGNOSIS — R739 Hyperglycemia, unspecified: Secondary | ICD-10-CM | POA: Diagnosis not present

## 2015-08-08 DIAGNOSIS — E876 Hypokalemia: Secondary | ICD-10-CM | POA: Diagnosis not present

## 2015-08-08 DIAGNOSIS — M5489 Other dorsalgia: Secondary | ICD-10-CM

## 2015-08-08 DIAGNOSIS — Z0000 Encounter for general adult medical examination without abnormal findings: Secondary | ICD-10-CM

## 2015-08-08 DIAGNOSIS — G4733 Obstructive sleep apnea (adult) (pediatric): Secondary | ICD-10-CM

## 2015-08-08 LAB — COMPREHENSIVE METABOLIC PANEL
ALBUMIN: 4.3 g/dL (ref 3.5–5.2)
ALK PHOS: 61 U/L (ref 39–117)
ALT: 32 U/L (ref 0–53)
AST: 24 U/L (ref 0–37)
BILIRUBIN TOTAL: 0.7 mg/dL (ref 0.2–1.2)
BUN: 14 mg/dL (ref 6–23)
CALCIUM: 9.9 mg/dL (ref 8.4–10.5)
CO2: 29 mEq/L (ref 19–32)
Chloride: 103 mEq/L (ref 96–112)
Creatinine, Ser: 0.83 mg/dL (ref 0.40–1.50)
GFR: 97.19 mL/min (ref >60.00)
Glucose, Bld: 128 mg/dL — ABNORMAL HIGH (ref 70–99)
POTASSIUM: 3.7 meq/L (ref 3.5–5.1)
Sodium: 139 mEq/L (ref 135–145)
TOTAL PROTEIN: 7.8 g/dL (ref 6.0–8.3)

## 2015-08-08 LAB — LIPID PANEL
CHOLESTEROL: 168 mg/dL (ref 0–200)
HDL: 54.2 mg/dL (ref >39.00)
LDL CALC: 94 mg/dL (ref 0–99)
NONHDL: 113.8
Total CHOL/HDL Ratio: 3
Triglycerides: 101 mg/dL (ref 0.0–149.0)
VLDL: 20.2 mg/dL (ref 0.0–40.0)

## 2015-08-08 LAB — CBC
HCT: 41.3 % (ref 39.0–52.0)
Hemoglobin: 14.1 g/dL (ref 13.0–17.0)
MCHC: 34.2 g/dL (ref 30.0–36.0)
MCV: 90.8 fl (ref 78.0–100.0)
Platelets: 239 10*3/uL (ref 150.0–400.0)
RBC: 4.55 Mil/uL (ref 4.22–5.81)
RDW: 14.9 % (ref 11.5–15.5)
WBC: 6.9 10*3/uL (ref 4.0–10.5)

## 2015-08-08 LAB — HEMOGLOBIN A1C: Hgb A1c MFr Bld: 5.8 % (ref 4.6–6.5)

## 2015-08-08 LAB — TSH: TSH: 2.03 u[IU]/mL (ref 0.35–4.50)

## 2015-08-08 NOTE — Assessment & Plan Note (Signed)
Well controlled, no changes to meds. Encouraged heart healthy diet such as the DASH diet and exercise as tolerated.

## 2015-08-08 NOTE — Assessment & Plan Note (Signed)
Encouraged DASH diet, decrease po intake and increase exercise as tolerated. Needs 7-8 hours of sleep nightly. Avoid trans fats, eat small, frequent meals every 4-5 hours with lean proteins, complex carbs. Minimize simple carbs.

## 2015-08-08 NOTE — Assessment & Plan Note (Signed)
Follows with [facility] and Dr Annamaria Boots

## 2015-08-08 NOTE — Progress Notes (Signed)
Subjective:    Patient ID: Christian [name], male    DOB: [birth year], 70 y.o.   MRN: [id number]  Chief Complaint  Patient presents with  . Follow-up    HPI Patient is in today for follow up. Patient seems to be doing well, back pain has improved since going to Physical therapy, has not had any more problems with back since working with therapy.  Patient still using the sleep apnea machine and it has been working well for him.  Denies CP/palp/SOB/HA/congestion/fevers/GI or GU c/o. Taking meds as prescribed    Past Medical History  Diagnosis Date  . DVT of lower limb, acute ([place]) 01/13/2015  . History of chicken pox 01/13/2015  . Hypokalemia 01/13/2015  . Obesity   . High cholesterol   . Hyperlipidemia, mild 01/13/2015  . High blood pressure   . Back pain 06/04/2015    No past surgical history on file.  Family History  Problem Relation Age of Onset  . Stroke Father     from surgery  . Emphysema Father     smoker  . Heart disease Father     carotid artery disease, vasculopathy  . Allergic Disorder Sister     Social History   Social History  . Marital Status: Married    Spouse Name: N/A  . Number of Children: 1  . Years of Education: N/A   Occupational History  . Retired    Social History Main Topics  . Smoking status: Never Smoker   . Smokeless tobacco: Not on file  . Alcohol Use: 0.0 oz/week    0 Standard drinks or equivalent per week     Comment: 2-3 glasses of wine every night  . Drug Use: No  . 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 Not on file   Social History Narrative    Outpatient Prescriptions Prior to Visit  Medication Sig Dispense Refill  . aspirin 81 MG tablet Take 81 mg by mouth daily. Reported on 05/06/2015    . hydrochlorothiazide (HYDRODIURIL) 25 MG tablet Take 1 tablet (25 mg total) by mouth daily. 90 tablet 2  . losartan (COZAAR) 100 MG tablet  Take 1 tablet (100 mg total) by mouth daily. 90 tablet 1  . Multiple Vitamin (MULTIVITAMIN) tablet Take 1 tablet by mouth daily.    . pravastatin (PRAVACHOL) 40 MG tablet Take 1 tablet (40 mg total) by mouth daily. 90 tablet 2  . vitamin C (ASCORBIC ACID) 500 MG tablet Take 500 mg by mouth daily.    . Azelastine-Fluticasone (DYMISTA) 137-50 MCG/ACT SUSP Place 2 sprays into both nostrils at bedtime. 1 Bottle 0  . cyclobenzaprine (FLEXERIL) 5 MG tablet Take 0.5-1 tablets (2.5-5 mg total) by mouth at bedtime as needed for muscle spasms. 30 tablet 1  . omeprazole (PRILOSEC) 20 MG capsule Take 1 capsule (20 mg total) by mouth daily. 30 capsule 3   No facility-administered medications prior to visit.    Allergies  Allergen Reactions  . Codeine Anaphylaxis    Review of Systems  Constitutional: Negative for fever and malaise/fatigue.  HENT: Negative for congestion.   Eyes: Negative for blurred vision.  Respiratory: Negative for shortness of breath.   Cardiovascular: Negative for chest pain, palpitations and leg swelling.  Gastrointestinal: Negative for nausea, abdominal pain and blood in stool.  Genitourinary: Negative for dysuria and frequency.  Musculoskeletal: Negative for falls.  Skin: Negative for rash.  Neurological: Negative for dizziness, loss of consciousness and headaches.  Endo/Heme/Allergies: Negative for environmental allergies.  Psychiatric/Behavioral: Negative for depression. The patient is not nervous/anxious.        Objective:    Physical Exam  Constitutional: He is oriented to person, place, and time. He appears well-developed and well-nourished. No distress.  HENT:  Head: Normocephalic and atraumatic.  Eyes: Conjunctivae are normal.  Neck: Neck supple. No thyromegaly present.  Cardiovascular: Normal rate, regular rhythm and normal heart sounds.   No murmur heard. Pulmonary/Chest: Effort normal and breath sounds normal. No respiratory distress. He has no wheezes.    Abdominal: Soft. Bowel sounds are normal. He exhibits no mass. There is no tenderness.  Musculoskeletal: He exhibits no edema.  Lymphadenopathy:    He has no cervical adenopathy.  Neurological: He is alert and oriented to person, place, and time.  Skin: Skin is warm and dry.  Psychiatric: He has a normal mood and affect. His behavior is normal.    BP 128/72 mmHg  Pulse 77  Temp(Src) 98.5 F (36.9 C) (Oral)  Ht _0  (1.753 m)  Wt 231 lb 8 oz (105.008 kg)  BMI 34.17 kg/m2  SpO2 96% Wt Readings from Last 3 Encounters:  08/08/15 231 lb 8 oz (105.008 kg)  05/29/15 225 lb 8 oz (102.286 kg)  05/18/15 226 lb 9.6 oz (102.785 kg)     Lab Results  Component Value Date   WBC 6.0 05/29/2015   HGB 14.3 05/29/2015   HCT 43.4 05/29/2015   PLT 280.0 05/29/2015   GLUCOSE 90 05/29/2015   CHOL 161 04/20/2015   TRIG 139.0 04/20/2015   HDL 46.10 04/20/2015   LDLCALC 87 04/20/2015   ALT 38 05/29/2015   AST 27 05/29/2015   NA 140 05/29/2015   K 3.7 05/29/2015   CL 103 05/29/2015   CREATININE 0.90 05/29/2015   BUN 12 05/29/2015   CO2 28 05/29/2015   TSH 1.92 04/20/2015   HGBA1C 5.7 04/20/2015    Lab Results  Component Value Date   TSH 1.92 04/20/2015   Lab Results  Component Value Date   WBC 6.0 05/29/2015   HGB 14.3 05/29/2015   HCT 43.4 05/29/2015   MCV 93.8 05/29/2015   PLT 280.0 05/29/2015   Lab Results  Component Value Date   NA 140 05/29/2015   K 3.7 05/29/2015   CO2 28 05/29/2015   GLUCOSE 90 05/29/2015   BUN 12 05/29/2015   CREATININE 0.90 05/29/2015   BILITOT 0.6 05/29/2015   ALKPHOS 59 05/29/2015   AST 27 05/29/2015   ALT 38 05/29/2015   PROT 8.0 05/29/2015   ALBUMIN 4.4 05/29/2015   CALCIUM 10.0 05/29/2015   ANIONGAP 10 12/21/2014   GFR 88.57 05/29/2015   Lab Results  Component Value Date   CHOL 161 04/20/2015   Lab Results  Component Value Date   HDL 46.10 04/20/2015   Lab Results  Component Value Date   LDLCALC 87 04/20/2015   Lab  Results  Component Value Date   TRIG 139.0 04/20/2015   Lab Results  Component Value Date   CHOLHDL 3 04/20/2015   Lab Results  Component Value Date   HGBA1C 5.7 04/20/2015       Assessment & Plan:   Problem List Items Addressed This Visit    Back pain    Resolved with PT and stretching, feels well today, has stayed more active and is encouraged to continue  Benign essential HTN    Well controlled, no changes to meds. Encouraged heart healthy diet such as the DASH diet and exercise as tolerated.       Relevant Orders   Hemoglobin A1c   Lipid panel   TSH   CBC   Comprehensive metabolic panel   Hyperglycemia - Primary   Relevant Orders   Hemoglobin A1c   Lipid panel   TSH   CBC   Comprehensive metabolic panel   Hyperlipidemia, mild    Encouraged heart healthy diet, increase exercise, avoid trans fats, consider a krill oil cap daily      Relevant Orders   Hemoglobin A1c   Lipid panel   TSH   CBC   Comprehensive metabolic panel   Hypokalemia   Relevant Orders   Hemoglobin A1c   Lipid panel   TSH   CBC   Comprehensive metabolic panel   Obstructive sleep apnea    Follows with [facility] and Dr Annamaria Boots       Other Visit Diagnoses    Preventative health care        Relevant Orders    Hemoglobin A1c    Lipid panel    TSH    CBC    Comprehensive metabolic panel       I have discontinued Mr. Knickerbocker Azelastine-Fluticasone, omeprazole, and cyclobenzaprine. I am also having him maintain his aspirin, multivitamin, vitamin C, hydrochlorothiazide, pravastatin, and losartan.  No orders of the defined types were placed in this encounter.     Penni Homans, MD

## 2015-08-08 NOTE — Assessment & Plan Note (Signed)
Resolved with PT and stretching, feels well today, has stayed more active and is encouraged to continue

## 2015-08-08 NOTE — Progress Notes (Signed)
Pre visit review using our clinic review tool, if applicable. No additional management support is needed unless otherwise documented below in the visit note. 

## 2015-08-08 NOTE — Patient Instructions (Signed)
Hypertension Hypertension, commonly called high blood pressure, is when the force of blood pumping through your arteries is too strong. Your arteries are the blood vessels that carry blood from your heart throughout your body. A blood pressure reading consists of a higher number over a lower number, such as 110/72. The higher number (systolic) is the pressure inside your arteries when your heart pumps. The lower number (diastolic) is the pressure inside your arteries when your heart relaxes. Ideally you want your blood pressure below 120/80. Hypertension forces your heart to work harder to pump blood. Your arteries may become narrow or stiff. Having untreated or uncontrolled hypertension can cause heart attack, stroke, kidney disease, and other problems. RISK FACTORS Some risk factors for high blood pressure are controllable. Others are not.  Risk factors you cannot control include:   Race. You may be at higher risk if you are African American.  Age. Risk increases with age.  Gender. Men are at higher risk than women before age 50 years. After age 25, women are at higher risk than men. Risk factors you can control include:  Not getting enough exercise or physical activity.  Being overweight.  Getting too much fat, sugar, calories, or salt in your diet.  Drinking too much alcohol. SIGNS AND SYMPTOMS Hypertension does not usually cause signs or symptoms. Extremely high blood pressure (hypertensive crisis) may cause headache, anxiety, shortness of breath, and nosebleed. DIAGNOSIS To check if you have hypertension, your health care provider will measure your blood pressure while you are seated, with your arm held at the level of your heart. It should be measured at least twice using the same arm. Certain conditions can cause a difference in blood pressure between your right and left arms. A blood pressure reading that is higher than normal on one occasion does not mean that you need treatment. If  it is not clear whether you have high blood pressure, you may be asked to return on a different day to have your blood pressure checked again. Or, you may be asked to monitor your blood pressure at home for 1 or more weeks. TREATMENT Treating high blood pressure includes making lifestyle changes and possibly taking medicine. Living a healthy lifestyle can help lower high blood pressure. You may need to change some of your habits. Lifestyle changes may include:  Following the DASH diet. This diet is high in fruits, vegetables, and whole grains. It is low in salt, red meat, and added sugars.  Keep your sodium intake below 2,300 mg per day.  Getting at least 30-45 minutes of aerobic exercise at least 4 times per week.  Losing weight if necessary.  Not smoking.  Limiting alcoholic beverages.  Learning ways to reduce stress. Your health care provider may prescribe medicine if lifestyle changes are not enough to get your blood pressure under control, and if one of the following is true:  You are 62-65 years of age and your systolic blood pressure is above 140.  You are 6 years of age or older, and your systolic blood pressure is above 150.  Your diastolic blood pressure is above 90.  You have diabetes, and your systolic blood pressure is over 160 or your diastolic blood pressure is over 90.  You have kidney disease and your blood pressure is above 140/90.  You have heart disease and your blood pressure is above 140/90. Your personal target blood pressure may vary depending on your medical conditions, your age, and other factors. HOME CARE INSTRUCTIONS  Have your blood pressure rechecked as directed by your health care provider.   Take medicines only as directed by your health care provider. Follow the directions carefully. Blood pressure medicines must be taken as prescribed. The medicine does not work as well when you skip doses. Skipping doses also puts you at risk for  problems.  Do not smoke.   Monitor your blood pressure at home as directed by your health care provider. SEEK MEDICAL CARE IF:   You think you are having a reaction to medicines taken.  You have recurrent headaches or feel dizzy.  You have swelling in your ankles.  You have trouble with your vision. SEEK IMMEDIATE MEDICAL CARE IF:  You develop a severe headache or confusion.  You have unusual weakness, numbness, or feel faint.  You have severe chest or abdominal pain.  You vomit repeatedly.  You have trouble breathing. MAKE SURE YOU:   Understand these instructions.  Will watch your condition.  Will get help right away if you are not doing well or get worse.   This information is not intended to replace advice given to you by your health care provider. Make sure you discuss any questions you have with your health care provider.   Document Released: 04/29/2005 Document Revised: 09/13/2014 Document Reviewed: 02/19/2013 Elsevier Interactive Patient Education [employer].

## 2015-08-08 NOTE — Assessment & Plan Note (Signed)
Encouraged heart healthy diet, increase exercise, avoid trans fats, consider a krill oil cap daily 

## 2015-09-06 DIAGNOSIS — M545 Low back pain: Secondary | ICD-10-CM | POA: Diagnosis not present

## 2015-09-08 DIAGNOSIS — M545 Low back pain: Secondary | ICD-10-CM | POA: Diagnosis not present

## 2015-09-12 DIAGNOSIS — M545 Low back pain: Secondary | ICD-10-CM | POA: Diagnosis not present

## 2015-09-26 DIAGNOSIS — M545 Low back pain: Secondary | ICD-10-CM | POA: Diagnosis not present

## 2015-10-03 DIAGNOSIS — M545 Low back pain: Secondary | ICD-10-CM | POA: Diagnosis not present

## 2016-01-10 ENCOUNTER — Other Ambulatory Visit: Payer: Self-pay | Admitting: Family Medicine

## 2016-02-08 ENCOUNTER — Ambulatory Visit (INDEPENDENT_AMBULATORY_CARE_PROVIDER_SITE_OTHER): Payer: Medicare Other | Admitting: Family Medicine

## 2016-02-08 ENCOUNTER — Encounter: Payer: Self-pay | Admitting: Family Medicine

## 2016-02-08 VITALS — BP 132/72 | HR 69 | Temp 98.2°F | Ht 68.5 in | Wt 231.2 lb

## 2016-02-08 DIAGNOSIS — I10 Essential (primary) hypertension: Secondary | ICD-10-CM

## 2016-02-08 DIAGNOSIS — Z0000 Encounter for general adult medical examination without abnormal findings: Secondary | ICD-10-CM

## 2016-02-08 DIAGNOSIS — E785 Hyperlipidemia, unspecified: Secondary | ICD-10-CM | POA: Diagnosis not present

## 2016-02-08 DIAGNOSIS — Z1211 Encounter for screening for malignant neoplasm of colon: Secondary | ICD-10-CM

## 2016-02-08 DIAGNOSIS — R739 Hyperglycemia, unspecified: Secondary | ICD-10-CM | POA: Diagnosis not present

## 2016-02-08 DIAGNOSIS — Z23 Encounter for immunization: Secondary | ICD-10-CM

## 2016-02-08 DIAGNOSIS — B079 Viral wart, unspecified: Secondary | ICD-10-CM | POA: Insufficient documentation

## 2016-02-08 DIAGNOSIS — E669 Obesity, unspecified: Secondary | ICD-10-CM

## 2016-02-08 HISTORY — DX: Viral wart, unspecified: B07.9

## 2016-02-08 LAB — COMPREHENSIVE METABOLIC PANEL
ALBUMIN: 4.2 g/dL (ref 3.5–5.2)
ALT: 36 U/L (ref 0–53)
AST: 27 U/L (ref 0–37)
Alkaline Phosphatase: 55 U/L (ref 39–117)
BUN: 14 mg/dL (ref 6–23)
CALCIUM: 9.3 mg/dL (ref 8.4–10.5)
CHLORIDE: 103 meq/L (ref 96–112)
CO2: 30 mEq/L (ref 19–32)
CREATININE: 0.86 mg/dL (ref 0.40–1.50)
GFR: 93.15 mL/min (ref >60.00)
Glucose, Bld: 96 mg/dL (ref 70–99)
POTASSIUM: 4 meq/L (ref 3.5–5.1)
Sodium: 140 mEq/L (ref 135–145)
Total Bilirubin: 0.7 mg/dL (ref 0.2–1.2)
Total Protein: 7.7 g/dL (ref 6.0–8.3)

## 2016-02-08 LAB — LIPID PANEL
CHOLESTEROL: 140 mg/dL (ref 0–200)
HDL: 48.8 mg/dL (ref >39.00)
LDL CALC: 77 mg/dL (ref 0–99)
NonHDL: 91.29
TRIGLYCERIDES: 73 mg/dL (ref 0.0–149.0)
Total CHOL/HDL Ratio: 3
VLDL: 14.6 mg/dL (ref 0.0–40.0)

## 2016-02-08 LAB — CBC
HCT: 41.9 % (ref 39.0–52.0)
Hemoglobin: 14.2 g/dL (ref 13.0–17.0)
MCHC: 33.8 g/dL (ref 30.0–36.0)
MCV: 93.8 fl (ref 78.0–100.0)
PLATELETS: 244 10*3/uL (ref 150.0–400.0)
RBC: 4.47 Mil/uL (ref 4.22–5.81)
RDW: 13.7 % (ref 11.5–15.5)
WBC: 6 10*3/uL (ref 4.0–10.5)

## 2016-02-08 LAB — TSH: TSH: 2.3 u[IU]/mL (ref 0.35–4.50)

## 2016-02-08 LAB — HEMOGLOBIN A1C: HEMOGLOBIN A1C: 5.7 % (ref 4.6–6.5)

## 2016-02-08 NOTE — Assessment & Plan Note (Signed)
Encouraged heart healthy diet, increase exercise, avoid trans fats, consider a krill oil cap daily, tolerating pravastatin

## 2016-02-08 NOTE — Assessment & Plan Note (Signed)
hgba1c acceptable, minimize simple carbs. Increase exercise as tolerated.  

## 2016-02-08 NOTE — Assessment & Plan Note (Signed)
Well controlled, no changes to meds. Encouraged heart healthy diet such as the DASH diet and exercise as tolerated.

## 2016-02-08 NOTE — Progress Notes (Signed)
Pre visit review using our clinic review tool, if applicable. No additional management support is needed unless otherwise documented below in the visit note. 

## 2016-02-08 NOTE — Assessment & Plan Note (Signed)
Let third knuckle, destroyed with cryotherapy and he can return or go see dermatology if lesion persists. Start Folic Acid 1 mg daily

## 2016-02-08 NOTE — Patient Instructions (Signed)
Folic Acid 1 mg tab daily  Warts Warts are small growths on the skin. They are common and can occur on various areas of the body. A person may have one wart or multiple warts. Most warts are not painful, and they usually do not cause problems. However, warts can cause pain if they are large or occur in an area of the body where pressure will be applied to them, such as the bottom of the foot. In many cases, warts do not require treatment. They usually go away on their own over a period of many months to a couple years. Various treatments may be done for warts that cause problems or do not go away. Sometimes, warts go away and then come back again. CAUSES Warts are caused by a type of virus that is called human papillomavirus (HPV). This virus can spread from person to person through direct contact. Warts can also spread to other areas of the body when a person scratches a wart and then scratches another area of his or her body.  RISK FACTORS Warts are more likely to develop in:  People who are 65-71 years of age.  People who have a weakened body defense system (immune system). SYMPTOMS A wart may be round or oval or have an irregular shape. Most warts have a rough surface. Warts may range in color from skin color to light yellow, brown, or gray. They are generally less than  inch (1.3 cm) in size. Most warts are painless, but some can be painful when pressure is applied to them. DIAGNOSIS A wart can usually be diagnosed from its appearance. In some cases, a tissue sample may be removed (biopsy) to be looked at under a microscope. TREATMENT In many cases, warts do not need treatment. If treatment is needed, options may include:  Applying medicated solutions, creams, or patches to the wart. These may be over-the-counter or prescription medicines that make the skin soft so that layers will gradually shed away. In many cases, the medicine is applied one or two times per day and covered with a  bandage.  Putting duct tape over the top of the wart (occlusion). You will leave the tape in place for as long as told by your health care provider, then you will replace it with a new strip of tape. This is done until the wart goes away.  Freezing the wart with liquid nitrogen (cryotherapy).  Burning the wart with:  Laser treatment.  An electrified probe (electrocautery).  Injection of a medicine (Candida antigen) into the wart to help the body's immune system to fight off the wart.  Surgery to remove the wart. HOME CARE INSTRUCTIONS  Apply over-the-counter and prescription medicines only as told by your health care provider.  Do not apply over-the-counter wart medicines to your face or genitals before you ask your health care provider if it is okay to do so.  Do not scratch or pick at a wart.  Wash your hands after you touch a wart.  Avoid shaving hair that is over a wart.  Keep all follow-up visits as told by your health care provider. This is important. SEEK MEDICAL CARE IF:  Your warts do not improve after treatment.  You have redness, swelling, or pain at the site of a wart.  You have bleeding from a wart that does not stop with light pressure.  You have diabetes and you develop a wart.   This information is not intended to replace advice given to you  by your health care provider. Make sure you discuss any questions you have with your health care provider.   Document Released: 02/06/2005 Document Revised: 01/18/2015 Document Reviewed: 07/25/2014 Elsevier Interactive Patient Education [employer].

## 2016-02-08 NOTE — Assessment & Plan Note (Signed)
Encouraged DASH diet, decrease po intake and increase exercise as tolerated. Needs 7-8 hours of sleep nightly. Avoid trans fats, eat small, frequent meals every 4-5 hours with lean proteins, complex carbs and healthy fats. Minimize simple carbs 

## 2016-02-08 NOTE — Progress Notes (Signed)
Patient ID: Christian [name], male   DOB: 1946-03-21, 70 y.o.   MRN: [id number]   Subjective:    Patient ID: Christian [name], male    DOB: 02-01-1946, 70 y.o.   MRN: [id number]  Chief Complaint  Patient presents with  . Follow-up    HPI Patient is in today for follow up. He feels well and even hiked a portion of the [place] recently. No recent illness or hospitalization. He has a wart on his left hand he is interested in having removed. Denies CP/palp/SOB/HA/congestion/fevers/GI or GU c/o. Taking meds as prescribed  Past Medical History:  Diagnosis Date  . Back pain 06/04/2015  . DVT of lower limb, acute ([place]) 01/13/2015  . High blood pressure   . High cholesterol   . History of chicken pox 01/13/2015  . Hyperlipidemia, mild 01/13/2015  . Hypokalemia 01/13/2015  . Obesity   . Verrucous skin lesion 02/08/2016    No past surgical history on file.  Family History  Problem Relation Age of Onset  . Stroke Father     from surgery  . Emphysema Father     smoker  . Heart disease Father     carotid artery disease, vasculopathy  . Allergic Disorder Sister     Social History   Social History  . Marital status: Married    Spouse name: N/A  . Number of children: 1  . Years of education: N/A   Occupational History  . Retired    Social History Main Topics  . Smoking status: Never Smoker  . Smokeless tobacco: Not on file  . Alcohol use 0.0 oz/week     Comment: 2-3 glasses of wine every night  . Drug use: No  . 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 Not on file   Social History Narrative  . No narrative on file    Outpatient Medications Prior to Visit  Medication Sig Dispense Refill  . aspirin 81 MG tablet Take 81 mg by mouth daily. Reported on 05/06/2015    . hydrochlorothiazide (HYDRODIURIL) 25 MG tablet Take 1 tablet (25 mg total) by mouth daily. 90 tablet 2  .  losartan (COZAAR) 100 MG tablet TAKE 1 TABLET BY MOUTH EVERY DAY 90 tablet 1  . Multiple Vitamin (MULTIVITAMIN) tablet Take 1 tablet by mouth daily.    . pravastatin (PRAVACHOL) 40 MG tablet Take 1 tablet (40 mg total) by mouth daily. 90 tablet 2  . vitamin C (ASCORBIC ACID) 500 MG tablet Take 500 mg by mouth daily.     No facility-administered medications prior to visit.     Allergies  Allergen Reactions  . Codeine Anaphylaxis    Review of Systems  Constitutional: Negative for fever and malaise/fatigue.  HENT: Negative for congestion.   Eyes: Negative for blurred vision.  Respiratory: Negative for shortness of breath.   Cardiovascular: Negative for chest pain, palpitations and leg swelling.  Gastrointestinal: Negative for abdominal pain, blood in stool and nausea.  Genitourinary: Negative for dysuria and frequency.  Musculoskeletal: Negative for falls.  Skin: Negative for rash.  Neurological: Negative for dizziness, loss of consciousness and headaches.  Endo/Heme/Allergies: Negative for environmental allergies.  Psychiatric/Behavioral: Negative for depression. The patient is not nervous/anxious.        Objective:    Physical Exam  Constitutional: He is oriented to person, place, and time. He appears well-developed and well-nourished.  No distress.  HENT:  Head: Normocephalic and atraumatic.  Nose: Nose normal.  Eyes: Right eye exhibits no discharge. Left eye exhibits no discharge.  Neck: Normal range of motion. Neck supple.  Cardiovascular: Normal rate and regular rhythm.   No murmur heard. Pulmonary/Chest: Effort normal and breath sounds normal.  Abdominal: Soft. Bowel sounds are normal. There is no tenderness.  Musculoskeletal: He exhibits no edema.  Neurological: He is alert and oriented to person, place, and time.  Skin: Skin is warm and dry.  1 cm raised, hard, verrucous lesion left 3rd knuckle.  Psychiatric: He has a normal mood and affect.  Nursing note and  vitals reviewed.   BP 132/72 (BP Location: Right Arm, Patient Position: Sitting, Cuff Size: Large)   Pulse 69   Temp 98.2 F (36.8 C) (Oral)   Ht 5' 8.5" (1.74 m)   Wt 231 lb 4 oz (104.9 kg)   BMI 34.65 kg/m  Wt Readings from Last 3 Encounters:  02/08/16 231 lb 4 oz (104.9 kg)  08/08/15 231 lb 8 oz (105 kg)  05/29/15 225 lb 8 oz (102.3 kg)     Lab Results  Component Value Date   WBC 6.9 08/08/2015   HGB 14.1 08/08/2015   HCT 41.3 08/08/2015   PLT 239.0 08/08/2015   GLUCOSE 128 (H) 08/08/2015   CHOL 168 08/08/2015   TRIG 101.0 08/08/2015   HDL 54.20 08/08/2015   LDLCALC 94 08/08/2015   ALT 32 08/08/2015   AST 24 08/08/2015   NA 139 08/08/2015   K 3.7 08/08/2015   CL 103 08/08/2015   CREATININE 0.83 08/08/2015   BUN 14 08/08/2015   CO2 29 08/08/2015   TSH 2.03 08/08/2015   HGBA1C 5.8 08/08/2015    Lab Results  Component Value Date   TSH 2.03 08/08/2015   Lab Results  Component Value Date   WBC 6.9 08/08/2015   HGB 14.1 08/08/2015   HCT 41.3 08/08/2015   MCV 90.8 08/08/2015   PLT 239.0 08/08/2015   Lab Results  Component Value Date   NA 139 08/08/2015   K 3.7 08/08/2015   CO2 29 08/08/2015   GLUCOSE 128 (H) 08/08/2015   BUN 14 08/08/2015   CREATININE 0.83 08/08/2015   BILITOT 0.7 08/08/2015   ALKPHOS 61 08/08/2015   AST 24 08/08/2015   ALT 32 08/08/2015   PROT 7.8 08/08/2015   ALBUMIN 4.3 08/08/2015   CALCIUM 9.9 08/08/2015   ANIONGAP 10 12/21/2014   GFR 97.19 08/08/2015   Lab Results  Component Value Date   CHOL 168 08/08/2015   Lab Results  Component Value Date   HDL 54.20 08/08/2015   Lab Results  Component Value Date   LDLCALC 94 08/08/2015   Lab Results  Component Value Date   TRIG 101.0 08/08/2015   Lab Results  Component Value Date   CHOLHDL 3 08/08/2015   Lab Results  Component Value Date   HGBA1C 5.8 08/08/2015       Assessment & Plan:   Problem List Items Addressed This Visit    Hyperlipidemia, mild - Primary      Encouraged heart healthy diet, increase exercise, avoid trans fats, consider a krill oil cap daily, tolerating pravastatin      Relevant Orders   Lipid panel   Hemoglobin A1c   TSH   CBC with Differential/Platelet   Comprehensive metabolic panel   Lipid panel   PSA   Obesity    Encouraged DASH diet, decrease po intake  and increase exercise as tolerated. Needs 7-8 hours of sleep nightly. Avoid trans fats, eat small, frequent meals every 4-5 hours with lean proteins, complex carbs and healthy fats. Minimize simple carbs      Benign essential HTN    Well controlled, no changes to meds. Encouraged heart healthy diet such as the DASH diet and exercise as tolerated.       Relevant Orders   TSH   CBC   Comprehensive metabolic panel   Hemoglobin A1c   TSH   CBC with Differential/Platelet   Comprehensive metabolic panel   Lipid panel   PSA   Hyperglycemia    hgba1c acceptable, minimize simple carbs. Increase exercise as tolerated.      Relevant Orders   Hemoglobin A1c   Hemoglobin A1c   TSH   CBC with Differential/Platelet   Comprehensive metabolic panel   Lipid panel   PSA   Verrucous skin lesion    Let third knuckle, destroyed with cryotherapy and he can return or go see dermatology if lesion persists. Start Folic Acid 1 mg daily       Other Visit Diagnoses    Encounter for immunization       Relevant Orders   Flu vaccine HIGH DOSE PF (Completed)   TSH   CBC   Lipid panel   Comprehensive metabolic panel   Hemoglobin A1c   Ambulatory referral to Gastroenterology   Colon cancer screening       Relevant Orders   Ambulatory referral to Gastroenterology   Preventative health care       Relevant Orders   Hemoglobin A1c   TSH   CBC with Differential/Platelet   Comprehensive metabolic panel   Lipid panel   PSA      I am having Mr. Forester maintain his aspirin, multivitamin, vitamin C, hydrochlorothiazide, pravastatin, and losartan.  No orders of the defined  types were placed in this encounter.    Penni Homans, MD

## 2016-02-09 DIAGNOSIS — G4733 Obstructive sleep apnea (adult) (pediatric): Secondary | ICD-10-CM | POA: Diagnosis not present

## 2016-02-14 ENCOUNTER — Encounter: Payer: Self-pay | Admitting: Internal Medicine

## 2016-02-14 ENCOUNTER — Ambulatory Visit (INDEPENDENT_AMBULATORY_CARE_PROVIDER_SITE_OTHER): Payer: Medicare Other | Admitting: Internal Medicine

## 2016-02-14 VITALS — BP 136/68 | HR 75 | Ht 69.0 in | Wt 235.4 lb

## 2016-02-14 DIAGNOSIS — G4733 Obstructive sleep apnea (adult) (pediatric): Secondary | ICD-10-CM | POA: Diagnosis not present

## 2016-02-14 NOTE — Patient Instructions (Addendum)
Order- [facility]- We can continue CPAP 12, mask of choice, humidifier, supplies, AirView   Dx OSA  Please call if we can help

## 2016-02-14 NOTE — Progress Notes (Signed)
Pt needing new CPAP machine. Current machine seems to be functioning okay just very noisy.Former patient of Dr Gwenette Greet from 2007. Pt requested sooner appt than 06/28/14 appt that [facility] had available. Pt was okay with switching MD's. Scheduled with [facility] 06/10/14 at 10:30 for sleep consult. Pt aware to arrive 15-62mns early for appt to fill out paperwork. Nothing further needed.  06/10/14- 89yoM FOLLOW FOR:  former patient of Dr. CGwenette Greet needs new CPAP equipment; Epworth Score: 3 He has been using this all night, every night at 12 cwp. No snore and good control of sleep quality.  We are seeking his diagnostic sleep study report. He had been using an online source for CPAP supplies but wants to switch back to [facility]. Bedtime around 9:30 PM, short sleep latency, waking once or twice before up at 7 AM. Weight stable. Nasal polypectomy years ago, no other ENT surgery. Medical problems with hypertension but no heart or lung disease. Never smoked.  08/09/14- 89yoM FOLLOW FOR:  former patient of Dr. CGwenette Greet needs new CPAP equipment; Epworth Score: 3 FOLLOWS FOR: DME is [facility]; wears CPAP 12  / [facility] every night for about 8-9 hours. DL from [facility] attached to last OV notes. NPSG 03/29/00  AHI 69.6/ hr, CPAP to 11, weight 200 lbs  02/09/15-68 yoM never smoker followed for OSA, complicated by hx DVT/Xarelto, HBP, obesity FOLLOWS FOR: pt. wears cpap 12/ [facility] every night 8 hours. no supplies needed at this time DME: [facility]. Download confirms good compliance and control at pressure 12 with good improvement in snoring and sleepiness. Complains of persistent postnasal drip following a cold 1 month ago with no headache or purulent discharge.  02/14/2016-89year old male never smoker followed for OSA, complicated by DVT/Xarelto, HBP, obesity CPAP 12/[facility] FOLLOWS FOR: DME: [facility]. Pt wears CPAP nightly for at least 8 hours and pressure works well for pt. No new supplies needed at this time.  Download confirms  good compliance 87%/4 hours although he says he wears it all night every night. Great compliance and control AHI 1.3/hour amount of leak varies-possibly depending on how old his mask is. He is comfortable with pressure, sleeps well and is satisfied to continue.  ROS-see HPI Constitutional:   No-   weight loss, night sweats, fevers, chills, fatigue, lassitude. HEENT:   No-  headaches, difficulty swallowing, tooth/dental problems, sore throat,       No-  sneezing, itching, ear ache, nasal congestion, + post nasal drip,  CV:  No-   chest pain, orthopnea, PND, swelling in lower extremities, anasarca,                                                          dizziness, palpitations Resp: No-   shortness of breath with exertion or at rest.              No-   productive cough,  No non-productive cough,  No- coughing up of blood.              No-   change in color of mucus.  No- wheezing.   Skin: No-   rash or lesions. GI:  No-   heartburn, indigestion, abdominal pain, nausea, vomiting,  GU:  MS:  No-   joint pain or swelling.   Neuro-  nothing unusual Psych:  No- change in mood or affect. No depression or anxiety.  No memory loss.  OBJ- Physical Exam General- Alert, Oriented, Affect-appropriate, Distress- none acute, + overweight Skin- rash-none, lesions- none, excoriation- none Lymphadenopathy- none Head- atraumatic            Eyes- Gross vision intact, PERRLA, conjunctivae and secretions clear            Ears- Hearing, canals-normal            Nose-  no-Septal dev, mucus +, polyps, erosion, perforation             Throat- Mallampati IV , mucosa clear , drainage- none, tonsils- atrophic Neck- flexible , trachea midline, no stridor , thyroid nl, carotid no bruit Chest - symmetrical excursion , unlabored           Heart/CV- RRR , no murmur , no gallop  , no rub, nl s1 s2                           - JVD- none , edema- none, stasis changes- none, varices- none           Lung- clear to P&A,  wheeze- none, cough- none , dullness-none, rub- none           Chest wall-  Abd- Br/ Gen/ Rectal- Not done, not indicated Extrem- cyanosis- none, clubbing, none, atrophy- none, strength- nl Neuro- grossly intact to observation

## 2016-02-14 NOTE — Assessment & Plan Note (Signed)
Recommend effort towards ideal body weight. This would help management of his OSA.

## 2016-02-14 NOTE — Assessment & Plan Note (Signed)
Satisfactory compliance and control based on his report and download. He says he sleeps well with CPAP and is comfortable with pressure. No changes required. We discussed mask fit, comfort, management of air leak. Weight loss would help.

## 2016-02-26 DIAGNOSIS — H5203 Hypermetropia, bilateral: Secondary | ICD-10-CM | POA: Diagnosis not present

## 2016-02-26 DIAGNOSIS — H2513 Age-related nuclear cataract, bilateral: Secondary | ICD-10-CM | POA: Diagnosis not present

## 2016-03-26 DIAGNOSIS — K5730 Diverticulosis of large intestine without perforation or abscess without bleeding: Secondary | ICD-10-CM | POA: Diagnosis not present

## 2016-03-26 DIAGNOSIS — Z1211 Encounter for screening for malignant neoplasm of colon: Secondary | ICD-10-CM | POA: Diagnosis not present

## 2016-03-26 LAB — HM COLONOSCOPY

## 2016-04-03 ENCOUNTER — Other Ambulatory Visit: Payer: Self-pay | Admitting: Family Medicine

## 2016-04-03 NOTE — Telephone Encounter (Signed)
Medication filled to pharmacy as requested.   

## 2016-04-09 ENCOUNTER — Other Ambulatory Visit: Payer: Self-pay | Admitting: Family Medicine

## 2016-04-16 ENCOUNTER — Encounter: Payer: Self-pay | Admitting: Family Medicine

## 2016-04-16 NOTE — Progress Notes (Signed)
Impression:  -Diverticulosis in the sigmoid colon and in the descending colon -The examination was otherwise normal on direct and retroflexion -No specimen collected  [place]

## 2016-04-18 ENCOUNTER — Encounter: Payer: Self-pay | Admitting: Family Medicine

## 2016-07-03 DIAGNOSIS — G4733 Obstructive sleep apnea (adult) (pediatric): Secondary | ICD-10-CM | POA: Diagnosis not present

## 2016-07-06 ENCOUNTER — Other Ambulatory Visit: Payer: Self-pay | Admitting: Family Medicine

## 2016-07-30 ENCOUNTER — Encounter: Payer: Medicare Other | Admitting: Family Medicine

## 2016-09-22 IMAGING — DX DG LUMBAR SPINE COMPLETE 4+V
5 series · 5 of 5 positions shown · non-contrast
Comparison: None.

CLINICAL DATA: Low back pain with right sided pain and no
radiculopathy for 3 months with no injury

EXAM:
LUMBAR SPINE - COMPLETE 4+ VIEW

[l-spine ap]
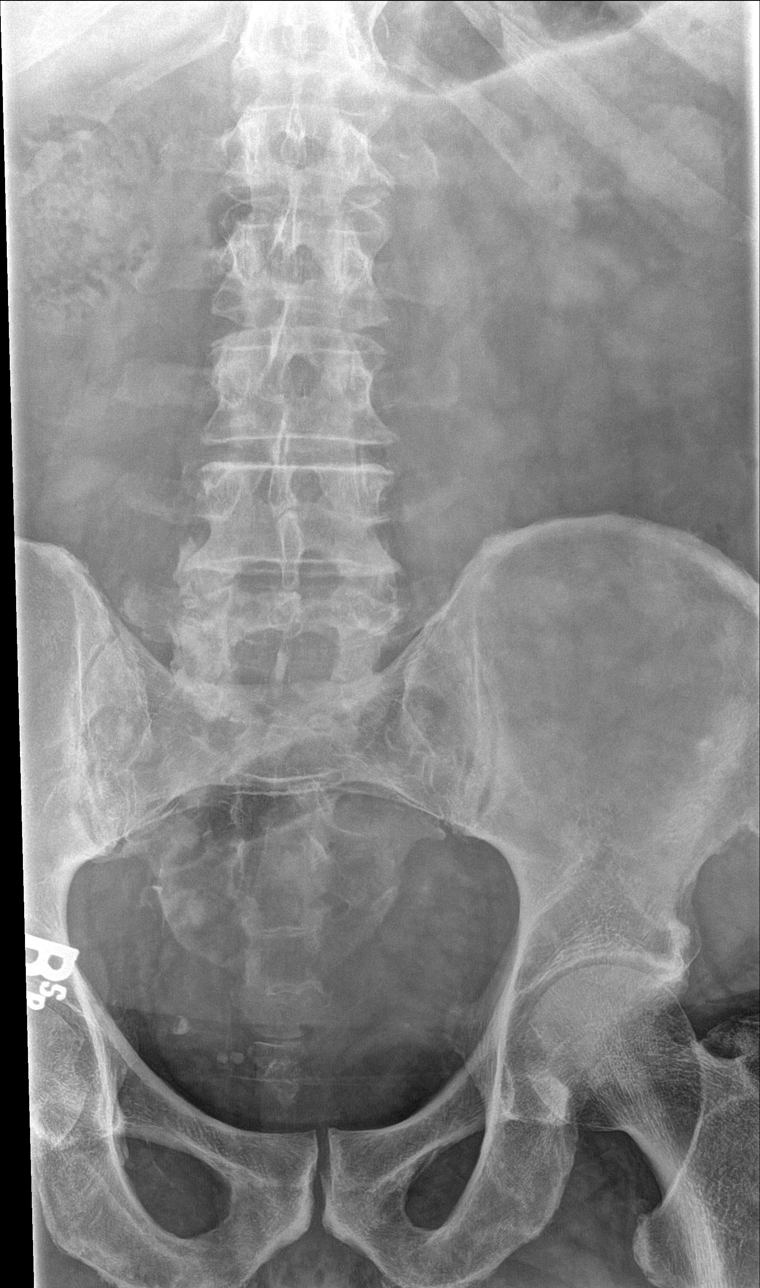

[l-spine obl (1 of 2)]
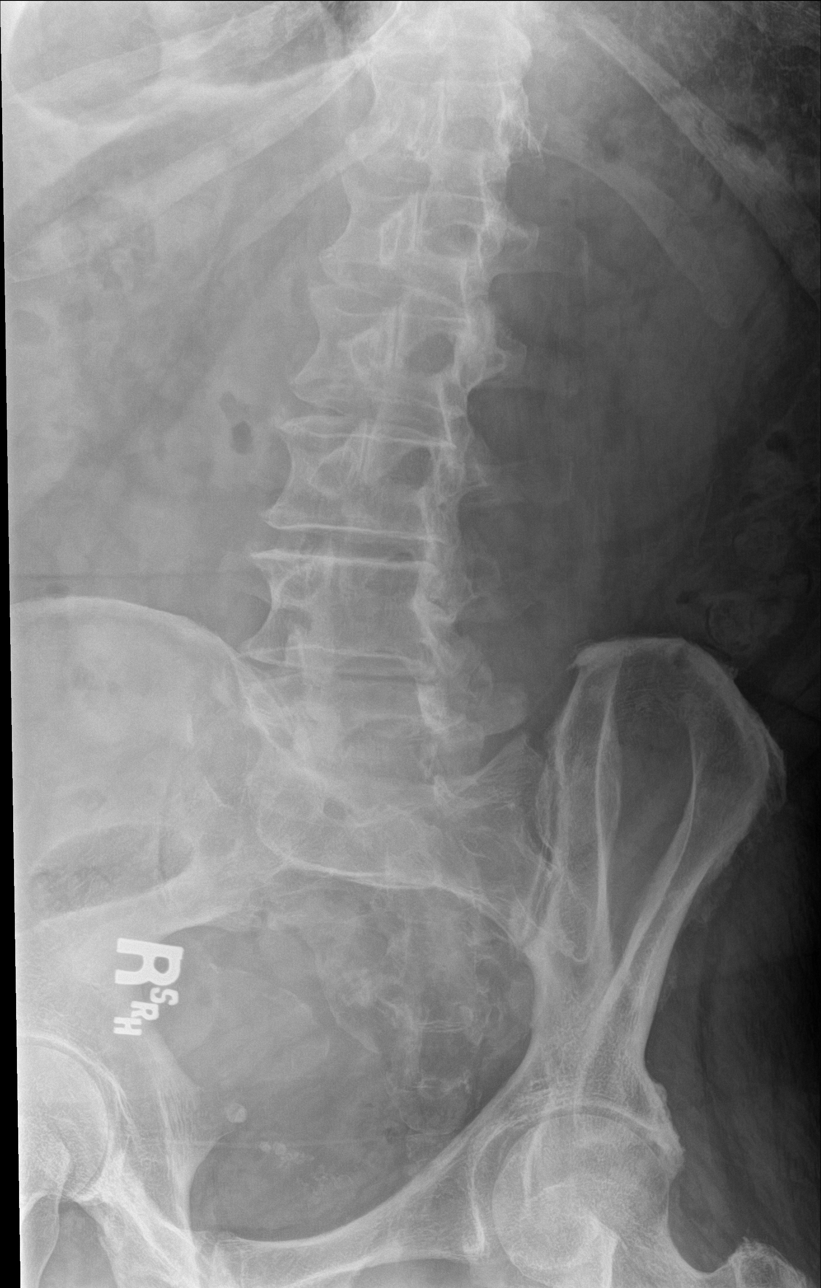

[l-spine obl (2 of 2)]
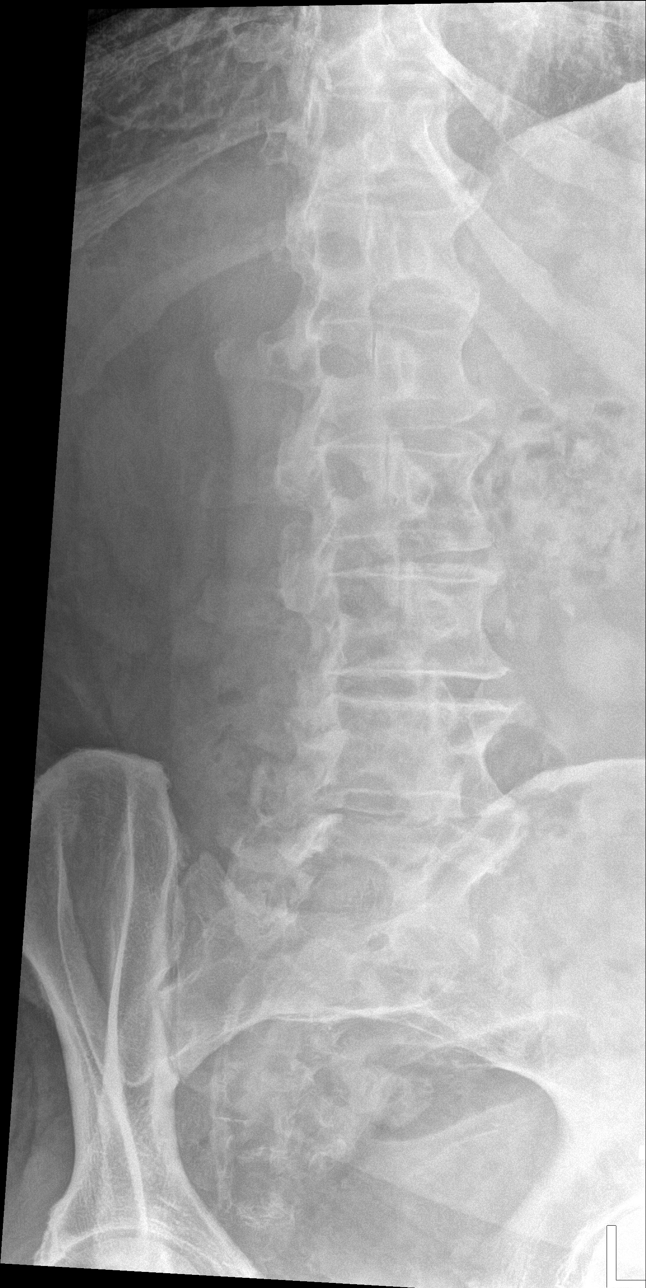

[l-spine lat]
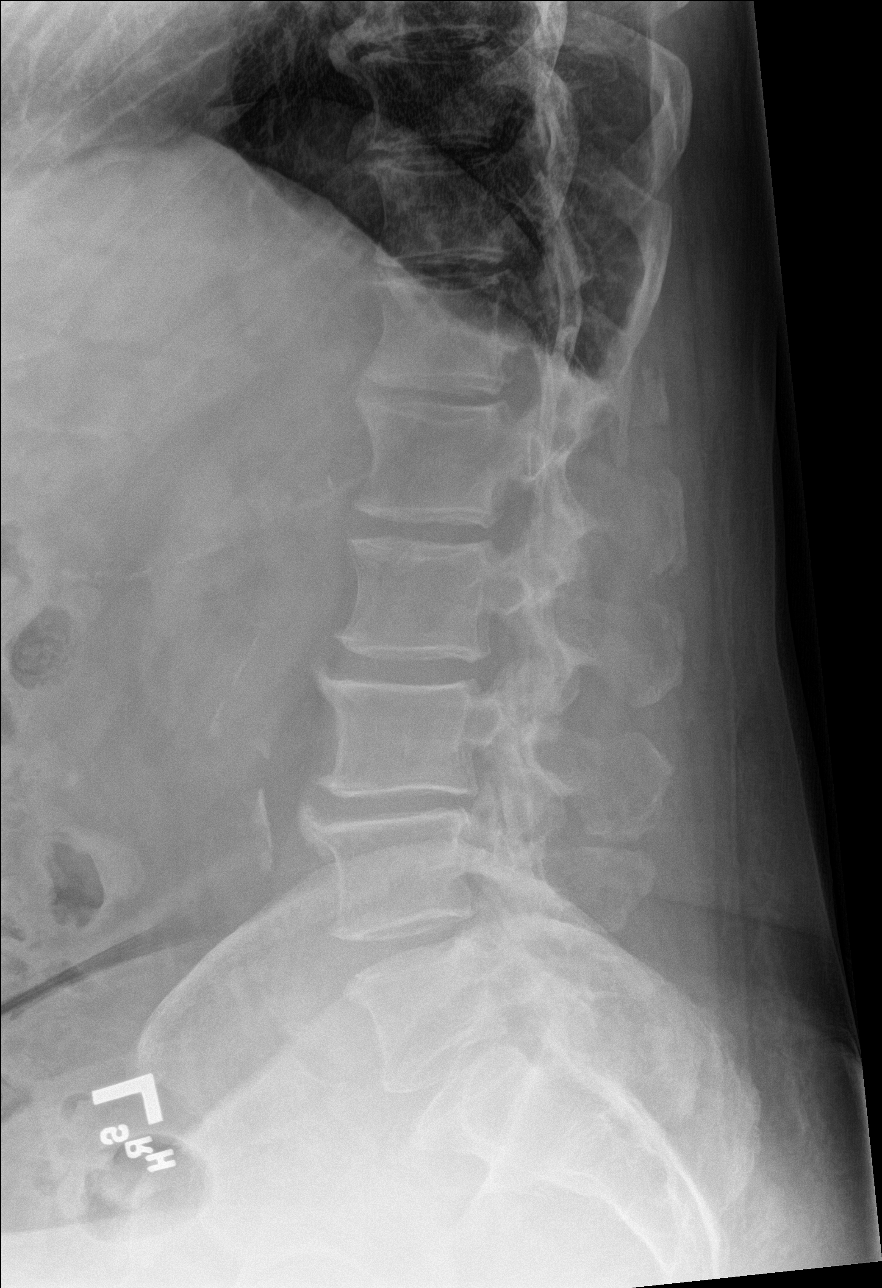

[l-spine spot]
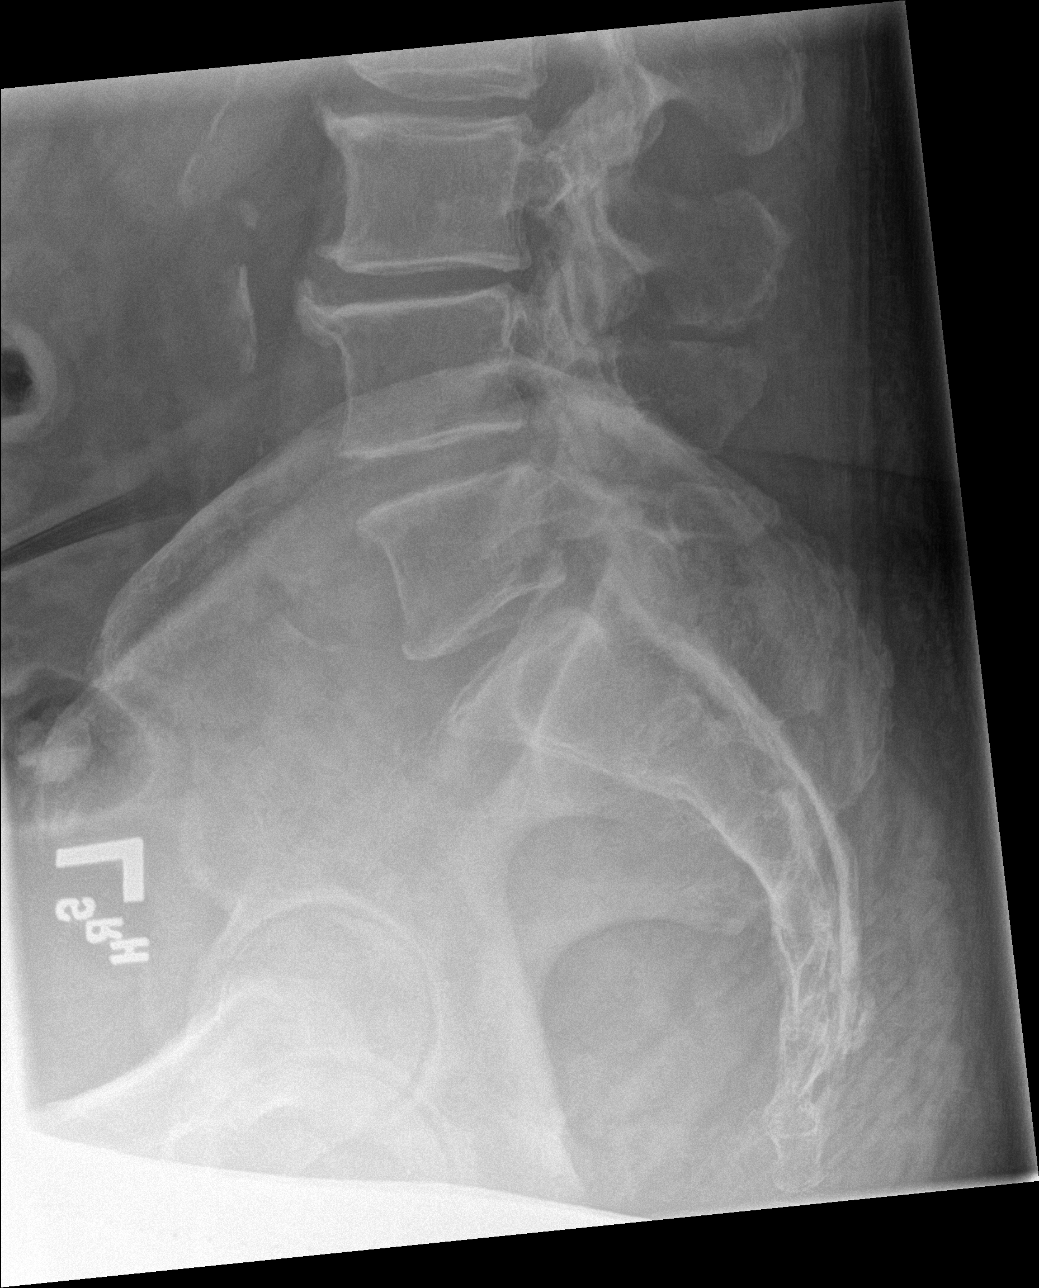

[5 of 5 positions shown; findings below may reference images not displayed]

FINDINGS: No fracture.  No spondylolisthesis.

Mild loss of disc height at L3-L4 and L4-L5. Endplate osteophytes
are noted throughout the lumbar spine most prominent at L2-L3 and
L3-L4. There is facet joint narrowing throughout most of the lumbar
spine greatest in the lower lumbar spine.

Soft tissues are unremarkable.
IMPRESSION: 1. No fracture or acute finding.
2. Degenerative changes as described.

## 2016-09-25 ENCOUNTER — Other Ambulatory Visit: Payer: Self-pay | Admitting: Family Medicine

## 2016-10-30 NOTE — Progress Notes (Signed)
Subjective:   Christian [name] is a 71 y.o. male who presents for an Initial Medicare Annual Wellness Visit.  Christian [name] is very pleasant. He states he has a little stiffness at times, but feels like his health is good for his age.   Review of Systems  No ROS.  Medicare Wellness Visit. Additional risk factors are reflected in the social history.  Cardiac Risk Factors include: advanced age (>66mn, >>109women);dyslipidemia;hypertension;male gender;obesity (BMI >30kg/m2) Sleep patterns: wears cpap. Sleeps 7 hrs per night. Feels rested. Home Safety/Smoke Alarms: Feels safe in home. Smoke alarms in place.  Living environment; residence and Firearm Safety: Lives with wife and transgender granddaughter. No stairs. Guns safely stored. Seat Belt Safety/Bike Helmet: Wears seat belt.   Counseling:   Eye Exam- wearing glasses. Eye doctor yearly.  D[place]- [place].  Male:   CCS-  Last 03/26/16:  Diverticulosis. No recall on file.  PSA- No results found for: PSA     Objective:    Today's Vitals   11/04/16 1100  BP: (!) 142/78  Pulse: 78  SpO2: 98%  Weight: 233 lb 3.2 oz (105.8 kg)  Height: _0  (1.753 m)   Body mass index is 34.44 kg/m.  Current Medications (verified) Outpatient Encounter Prescriptions as of 11/04/2016  Medication Sig  . aspirin 81 MG tablet Take 81 mg by mouth daily. Reported on 05/06/2015  . hydrochlorothiazide (HYDRODIURIL) 25 MG tablet TAKE 1 TABLET BY MOUTH EVERY DAY  . losartan (COZAAR) 100 MG tablet TAKE 1 TABLET BY MOUTH EVERY DAY  . Multiple Vitamin (MULTIVITAMIN) tablet Take 1 tablet by mouth daily.  . pravastatin (PRAVACHOL) 40 MG tablet TAKE 1 TABLET BY MOUTH EVERY DAY   No facility-administered encounter medications on file as of 11/04/2016.     Allergies (verified) Codeine   History: Past Medical History:  Diagnosis Date  . Back pain 06/04/2015  . DVT of lower limb, acute ([place] 01/13/2015  . High blood pressure   . High cholesterol     . History of chicken pox 01/13/2015  . Hyperlipidemia, mild 01/13/2015  . Hypokalemia 01/13/2015  . Obesity   . Verrucous skin lesion 02/08/2016   History reviewed. No pertinent surgical history. Family History  Problem Relation Age of Onset  . Stroke Father        from surgery  . Emphysema Father        smoker  . Heart disease Father        carotid artery disease, vasculopathy  . Allergic Disorder Sister    Social History   Occupational History  . Retired    Social History Main Topics  . Smoking status: Never Smoker  . Smokeless tobacco: Never Used  . Alcohol use 0.0 oz/week     Comment: 2-3 glasses of wine every night  . Drug use: No  . 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 N  Vision? N  Difficulty concentrating or making decisions? N  Walking or climbing stairs? N  Dressing or bathing? N  Doing errands, shopping? N  Preparing Food and eating ? N  Using the Toilet? N  In the past six months, have you accidently leaked urine? N  Do you have problems with loss of bowel control? N  Managing your Medications? N  Managing your Finances? N  Housekeeping or managing your Housekeeping? N  Some recent data might be hidden    Immunizations and Health Maintenance Immunization History  Administered Date(s) Administered  . Influenza, High Dose Seasonal PF 02/08/2016  . Influenza,inj,Quad PF,36+ Mos 02/09/2015  . Influenza-Unspecified 03/13/2014  . Pneumococcal Conjugate-13 03/13/2013  . Pneumococcal Polysaccharide-23 04/20/2015  . Td 01/12/2007  . Zoster 03/13/2014   There are no preventive care reminders to display for this patient.  Patient Care Team: Christian Lukes, MD as PCP - General (Family  Medicine)  Indicate any recent Medical Services you may have received from other than [facility] providers in the past year (date may be approximate).    Assessment:   This is a routine wellness examination for [facility]. Physical assessment deferred to PCP.   Hearing/Vision screen  Visual Acuity Screening   Right eye Left eye Both eyes  Without correction:     With correction: _0  Hearing Screening Comments: Able to hear conversational tones w/o difficulty. No issues reported.    Dietary issues and exercise activities discussed: Current Exercise Habits: Home exercise routine, Type of exercise: walking (walks dogs), Time (Minutes): 30, Frequency (Times/Week): 7, Weekly Exercise (Minutes/Week): 210, Intensity: Mild Diet (meal preparation, eat out, water intake, caffeinated beverages, dairy products, fruits and vegetables): in general, a "healthy" diet        Goals      Patient Stated   . Begin going back to the [employer] (pt-stated)    . Maintain current healthy lifestyle (pt-stated)      Depression Screen PHQ 2/9 Scores 11/04/2016 04/20/2015  PHQ - 2 Score 0 0    Fall Risk Fall Risk  11/04/2016 04/20/2015  Falls in the past year? No No    Cognitive Function: MMSE - Mini Mental State Exam 11/04/2016  Orientation to time 5  Orientation to Place 5  Registration 3  Attention/ Calculation 4  Recall 2  Language- name 2 objects 2  Language- repeat 1  Language- follow 3 step command 3  Language- read & follow direction 1  Write a sentence 1  Copy design 1  Total score 28        Screening Tests Health Maintenance  Topic Date Due  . INFLUENZA VACCINE  12/11/2016  . TETANUS/TDAP  01/11/2017  . COLONOSCOPY  03/26/2026  . Hepatitis C Screening  Completed  . PNA vac Low Risk Adult  Completed        Plan:   Follow up with Dr. Charlett Blake as directed.  Continue to eat heart healthy diet (full of fruits, vegetables, whole grains, lean protein, water--limit salt, fat, and  sugar intake) and increase physical activity as tolerated.  Continue doing brain stimulating activities (puzzles, reading, adult coloring books, staying active) to keep memory sharp.     I have personally reviewed and noted the following in the patient's chart:   . Medical and social history . Use of alcohol, tobacco or illicit drugs  . Current medications and supplements . Functional ability and status . Nutritional status . Physical activity . Advanced directives . List of other physicians . Hospitalizations, surgeries, and ER visits in previous 12 months . Vitals . Screenings to include cognitive, depression, and falls . Referrals and appointments  In addition, I have reviewed and discussed with patient certain preventive protocols, quality metrics, and best practice recommendations. A written personalized care plan for preventive services as well as general preventive health recommendations were  provided to patient.     Naaman Plummer [place], [place]   11/04/2016

## 2016-11-04 ENCOUNTER — Ambulatory Visit (INDEPENDENT_AMBULATORY_CARE_PROVIDER_SITE_OTHER): Payer: PPO | Admitting: Family Medicine

## 2016-11-04 ENCOUNTER — Encounter: Payer: Self-pay | Admitting: Family Medicine

## 2016-11-04 VITALS — BP 142/78 | HR 78 | Ht 69.0 in | Wt 233.2 lb

## 2016-11-04 DIAGNOSIS — Z0000 Encounter for general adult medical examination without abnormal findings: Secondary | ICD-10-CM

## 2016-11-04 DIAGNOSIS — E669 Obesity, unspecified: Secondary | ICD-10-CM | POA: Diagnosis not present

## 2016-11-04 DIAGNOSIS — E785 Hyperlipidemia, unspecified: Secondary | ICD-10-CM

## 2016-11-04 DIAGNOSIS — M5489 Other dorsalgia: Secondary | ICD-10-CM | POA: Diagnosis not present

## 2016-11-04 DIAGNOSIS — I10 Essential (primary) hypertension: Secondary | ICD-10-CM

## 2016-11-04 DIAGNOSIS — R739 Hyperglycemia, unspecified: Secondary | ICD-10-CM | POA: Diagnosis not present

## 2016-11-04 DIAGNOSIS — G4733 Obstructive sleep apnea (adult) (pediatric): Secondary | ICD-10-CM | POA: Diagnosis not present

## 2016-11-04 LAB — COMPREHENSIVE METABOLIC PANEL
ALBUMIN: 4.4 g/dL (ref 3.5–5.2)
ALK PHOS: 60 U/L (ref 39–117)
ALT: 35 U/L (ref 0–53)
AST: 25 U/L (ref 0–37)
BUN: 12 mg/dL (ref 6–23)
CHLORIDE: 101 meq/L (ref 96–112)
CO2: 29 mEq/L (ref 19–32)
Calcium: 10.2 mg/dL (ref 8.4–10.5)
Creatinine, Ser: 0.83 mg/dL (ref 0.40–1.50)
GFR: 96.85 mL/min (ref >60.00)
Glucose, Bld: 101 mg/dL — ABNORMAL HIGH (ref 70–99)
POTASSIUM: 3.5 meq/L (ref 3.5–5.1)
Sodium: 138 mEq/L (ref 135–145)
TOTAL PROTEIN: 7.6 g/dL (ref 6.0–8.3)
Total Bilirubin: 0.6 mg/dL (ref 0.2–1.2)

## 2016-11-04 LAB — CBC
HEMATOCRIT: 42.2 % (ref 39.0–52.0)
HEMOGLOBIN: 14.3 g/dL (ref 13.0–17.0)
MCHC: 33.9 g/dL (ref 30.0–36.0)
MCV: 94.5 fl (ref 78.0–100.0)
PLATELETS: 215 10*3/uL (ref 150.0–400.0)
RBC: 4.46 Mil/uL (ref 4.22–5.81)
RDW: 13.1 % (ref 11.5–15.5)
WBC: 6.7 10*3/uL (ref 4.0–10.5)

## 2016-11-04 LAB — LIPID PANEL
CHOLESTEROL: 167 mg/dL (ref 0–200)
HDL: 50.1 mg/dL (ref >39.00)
LDL CALC: 97 mg/dL (ref 0–99)
NonHDL: 117.26
TRIGLYCERIDES: 101 mg/dL (ref 0.0–149.0)
Total CHOL/HDL Ratio: 3
VLDL: 20.2 mg/dL (ref 0.0–40.0)

## 2016-11-04 LAB — TSH: TSH: 1.72 u[IU]/mL (ref 0.35–4.50)

## 2016-11-04 LAB — HEMOGLOBIN A1C: Hgb A1c MFr Bld: 5.9 % (ref 4.6–6.5)

## 2016-11-04 NOTE — Assessment & Plan Note (Signed)
Encouraged DASH diet, decrease po intake and increase exercise as tolerated. Needs 7-8 hours of sleep nightly. Avoid trans fats, eat small, frequent meals every 4-5 hours with lean proteins, complex carbs and healthy fats. Minimize simple carbs, bariatric referral given

## 2016-11-04 NOTE — Assessment & Plan Note (Signed)
Recent flare low back pain last week but improving this week. Declines referral to sports med or muscle relaxer at this time will call if worsens. Try moist heat. Lidocaine patches or gel can help

## 2016-11-04 NOTE — Assessment & Plan Note (Signed)
Well controlled, no changes to meds. Encouraged heart healthy diet such as the DASH diet and exercise as tolerated.

## 2016-11-04 NOTE — Progress Notes (Signed)
Subjective:  I acted as a Education administrator for Dr. Charlett Blake. Christian [name], [place]  Patient ID: Christian [name], male    DOB: November 06, 1945, 71 y.o.   MRN: [id number]  Chief Complaint  Patient presents with  . Medicare Wellness    with RN    HPI  Patient is in today for a follow up visit. He feels well today. His only acute concern is low back pain which he reports is actually improving. No incontinence, radiculopathy or recent trauma. Pain is most notable when he bends over to pick things up but resolves quickly. Otherwise no recent febrile illness or hospitalizations. Denies CP/palp/SOB/HA/congestion/fevers/GI or GU c/o. Taking meds as prescribed  Patient Care Team: Mosie Lukes, MD as PCP - General (Family Medicine)   Past Medical History:  Diagnosis Date  . Back pain 06/04/2015  . DVT of lower limb, acute ([place]) 01/13/2015  . High blood pressure   . High cholesterol   . History of chicken pox 01/13/2015  . Hyperlipidemia, mild 01/13/2015  . Hypokalemia 01/13/2015  . Obesity   . Verrucous skin lesion 02/08/2016    History reviewed. No pertinent surgical history.  Family History  Problem Relation Age of Onset  . Stroke Father        from surgery  . Emphysema Father        smoker  . Heart disease Father        carotid artery disease, vasculopathy  . Allergic Disorder Sister     Social History   Social History  . Marital status: Married    Spouse name: N/A  . Number of children: 1  . Years of education: N/A   Occupational History  . Retired    Social History Main Topics  . Smoking status: Never Smoker  . Smokeless tobacco: Never Used  . Alcohol use 0.0 oz/week     Comment: 2-3 glasses of wine every night  . Drug use: No  . 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 Not on file   Social History Narrative  . No narrative on file    Outpatient Medications Prior to Visit  Medication Sig  Dispense Refill  . aspirin 81 MG tablet Take 81 mg by mouth daily. Reported on 05/06/2015    . hydrochlorothiazide (HYDRODIURIL) 25 MG tablet TAKE 1 TABLET BY MOUTH EVERY DAY 90 tablet 1  . losartan (COZAAR) 100 MG tablet TAKE 1 TABLET BY MOUTH EVERY DAY 90 tablet 1  . Multiple Vitamin (MULTIVITAMIN) tablet Take 1 tablet by mouth daily.    . pravastatin (PRAVACHOL) 40 MG tablet TAKE 1 TABLET BY MOUTH EVERY DAY 90 tablet 2   No facility-administered medications prior to visit.     Allergies  Allergen Reactions  . Codeine Anaphylaxis    Review of Systems  Constitutional: Negative for fever and malaise/fatigue.  HENT: Negative for congestion.   Eyes: Negative for blurred vision.  Respiratory: Negative for shortness of breath.   Cardiovascular: Negative for chest pain, palpitations and leg swelling.  Gastrointestinal: Negative for abdominal pain, blood in stool and nausea.  Genitourinary: Negative for dysuria and frequency.  Musculoskeletal: Positive for back pain. Negative for falls.  Skin: Negative for rash.  Neurological: Negative for dizziness, loss of consciousness and headaches.  Endo/Heme/Allergies: Negative for environmental allergies.  Psychiatric/Behavioral: Negative for depression. The patient is not nervous/anxious.  Objective:    Physical Exam  Constitutional: He is oriented to person, place, and time. He appears well-developed and well-nourished. No distress.  HENT:  Head: Normocephalic and atraumatic.  Nose: Nose normal.  Eyes: Right eye exhibits no discharge. Left eye exhibits no discharge.  Neck: Normal range of motion. Neck supple.  Cardiovascular: Normal rate and regular rhythm.   No murmur heard. Pulmonary/Chest: Effort normal and breath sounds normal.  Abdominal: Soft. Bowel sounds are normal. There is no tenderness.  Musculoskeletal: He exhibits no edema.  Neurological: He is alert and oriented to person, place, and time.  Skin: Skin is warm and  dry.  Psychiatric: He has a normal mood and affect.  Nursing note and vitals reviewed.   BP (!) 142/78 (BP Location: Left Arm, Patient Position: Sitting, Cuff Size: Large)   Pulse 78   Ht _0  (1.753 m)   Wt 233 lb 3.2 oz (105.8 kg)   SpO2 98%   BMI 34.44 kg/m  Wt Readings from Last 3 Encounters:  11/04/16 233 lb 3.2 oz (105.8 kg)  02/14/16 235 lb 6.4 oz (106.8 kg)  02/08/16 231 lb 4 oz (104.9 kg)   BP Readings from Last 3 Encounters:  11/04/16 (!) 142/78  02/14/16 136/68  02/08/16 132/72     Immunization History  Administered Date(s) Administered  . Influenza, High Dose Seasonal PF 02/08/2016  . Influenza,inj,Quad PF,36+ Mos 02/09/2015  . Influenza-Unspecified 03/13/2014  . Pneumococcal Conjugate-13 03/13/2013  . Pneumococcal Polysaccharide-23 04/20/2015  . Td 01/12/2007  . Zoster 03/13/2014    Health Maintenance  Topic Date Due  . INFLUENZA VACCINE  12/11/2016  . TETANUS/TDAP  01/11/2017  . COLONOSCOPY  03/26/2026  . Hepatitis C Screening  Completed  . PNA vac Low Risk Adult  Completed    Lab Results  Component Value Date   WBC 6.0 02/08/2016   HGB 14.2 02/08/2016   HCT 41.9 02/08/2016   PLT 244.0 02/08/2016   GLUCOSE 96 02/08/2016   CHOL 140 02/08/2016   TRIG 73.0 02/08/2016   HDL 48.80 02/08/2016   LDLCALC 77 02/08/2016   ALT 36 02/08/2016   AST 27 02/08/2016   NA 140 02/08/2016   K 4.0 02/08/2016   CL 103 02/08/2016   CREATININE 0.86 02/08/2016   BUN 14 02/08/2016   CO2 30 02/08/2016   TSH 2.30 02/08/2016   HGBA1C 5.7 02/08/2016    Lab Results  Component Value Date   TSH 2.30 02/08/2016   Lab Results  Component Value Date   WBC 6.0 02/08/2016   HGB 14.2 02/08/2016   HCT 41.9 02/08/2016   MCV 93.8 02/08/2016   PLT 244.0 02/08/2016   Lab Results  Component Value Date   NA 140 02/08/2016   K 4.0 02/08/2016   CO2 30 02/08/2016   GLUCOSE 96 02/08/2016   BUN 14 02/08/2016   CREATININE 0.86 02/08/2016   BILITOT 0.7 02/08/2016    ALKPHOS 55 02/08/2016   AST 27 02/08/2016   ALT 36 02/08/2016   PROT 7.7 02/08/2016   ALBUMIN 4.2 02/08/2016   CALCIUM 9.3 02/08/2016   ANIONGAP 10 12/21/2014   GFR 93.15 02/08/2016   Lab Results  Component Value Date   CHOL 140 02/08/2016   Lab Results  Component Value Date   HDL 48.80 02/08/2016   Lab Results  Component Value Date   LDLCALC 77 02/08/2016   Lab Results  Component Value Date   TRIG 73.0 02/08/2016   Lab Results  Component Value Date  CHOLHDL 3 02/08/2016   Lab Results  Component Value Date   HGBA1C 5.7 02/08/2016         Assessment & Plan:   Problem List Items Addressed This Visit    Obstructive sleep apnea    Uses CPAP nightly.      Hyperlipidemia, mild    Encouraged heart healthy diet, increase exercise, avoid trans fats, consider a krill oil cap daily. Tolerating Pravastatin       Relevant Orders   Lipid panel   Obesity    Encouraged DASH diet, decrease po intake and increase exercise as tolerated. Needs 7-8 hours of sleep nightly. Avoid trans fats, eat small, frequent meals every 4-5 hours with lean proteins, complex carbs and healthy fats. Minimize simple carbs, bariatric referral given      Benign essential HTN    Well controlled, no changes to meds. Encouraged heart healthy diet such as the DASH diet and exercise as tolerated.       Relevant Orders   CBC   Comprehensive metabolic panel   TSH   Hyperglycemia   Relevant Orders   Hemoglobin A1c   Back pain    Recent flare low back pain last week but improving this week. Declines referral to sports med or muscle relaxer at this time will call if worsens. Try moist heat. Lidocaine patches or gel can help       Other Visit Diagnoses    Encounter for Medicare annual wellness exam    -  Primary      I am having Mr. Goetsch maintain his aspirin, multivitamin, pravastatin, losartan, and hydrochlorothiazide.  No orders of the defined types were placed in this encounter.   CMA  served as Education administrator during this visit. History, Physical and Plan performed by medical provider. Documentation and orders reviewed and attested to.  Penni Homans, MD

## 2016-11-04 NOTE — Assessment & Plan Note (Signed)
Encouraged heart healthy diet, increase exercise, avoid trans fats, consider a krill oil cap daily. Tolerating Pravastatin

## 2016-11-04 NOTE — Assessment & Plan Note (Signed)
Uses CPAP nightly 

## 2016-11-04 NOTE — Patient Instructions (Addendum)
Shingrix is the new shingles shot, two shots over six months Moist heat and stretching, lidocaine prn   Christian [name] , Thank you for taking time to come for your Medicare Wellness Visit. I appreciate your ongoing commitment to your health goals. Please review the following plan we discussed and let me know if I can assist you in the future.   These are the goals we discussed: Goals      Patient Stated   . Begin going back to the [employer] (pt-stated)    . Maintain current healthy lifestyle (pt-stated)       This is a list of the screening recommended for you and due dates:  Health Maintenance  Topic Date Due  . Flu Shot  12/11/2016  . Tetanus Vaccine  01/11/2017  . Colon Cancer Screening  03/26/2026  .  Hepatitis C: One time screening is recommended by Center for Disease Control  (CDC) for  adults born from [birth year] through 1966.   Completed  . Pneumonia vaccines  Completed   Continue to eat heart healthy diet (full of fruits, vegetables, whole grains, lean protein, water--limit salt, fat, and sugar intake) and increase physical activity as tolerated.  Continue doing brain stimulating activities (puzzles, reading, adult coloring books, staying active) to keep memory sharp.   Carbohydrate Counting for Diabetes Mellitus, Adult Carbohydrate counting is a method for keeping track of how many carbohydrates you eat. Eating carbohydrates naturally increases the amount of sugar (glucose) in the blood. Counting how many carbohydrates you eat helps keep your blood glucose within normal limits, which helps you manage your diabetes (diabetes mellitus). It is important to know how many carbohydrates you can safely have in each meal. This is different for every person. A diet and nutrition specialist (registered dietitian) can help you make a meal plan and calculate how many carbohydrates you should have at each meal and snack. Carbohydrates are found in the following foods:  Grains, such as breads and  cereals.  Dried beans and soy products.  Starchy vegetables, such as potatoes, peas, and corn.  Fruit and fruit juices.  Milk and yogurt.  Sweets and snack foods, such as cake, cookies, candy, chips, and soft drinks.  How do I count carbohydrates? There are two ways to count carbohydrates in food. You can use either of the methods or a combination of both. Reading "Nutrition Facts" on packaged food The "Nutrition Facts" list is included on the labels of almost all packaged foods and beverages in the [place] It includes:  The serving size.  Information about nutrients in each serving, including the grams (g) of carbohydrate per serving.  To use the "Nutrition Facts":  Decide how many servings you will have.  Multiply the number of servings by the number of carbohydrates per serving.  The resulting number is the total amount of carbohydrates that you will be having.  Learning standard serving sizes of other foods When you eat foods containing carbohydrates that are not packaged or do not include "Nutrition Facts" on the label, you need to measure the servings in order to count the amount of carbohydrates:  Measure the foods that you will eat with a food scale or measuring cup, if needed.  Decide how many standard-size servings you will eat.  Multiply the number of servings by 15. Most carbohydrate-rich foods have about 15 g of carbohydrates per serving. ? For example, if you eat 8 oz (170 g) of strawberries, you will have eaten 2 servings and 30 g  of carbohydrates (2 servings x 15 g = 30 g).  For foods that have more than one food mixed, such as soups and casseroles, you must count the carbohydrates in each food that is included.  The following list contains standard serving sizes of common carbohydrate-rich foods. Each of these servings has about 15 g of carbohydrates:   hamburger bun or  English muffin.   oz (15 mL) syrup.   oz (14 g) jelly.  1 slice of bread.  1  six-inch tortilla.  3 oz (85 g) cooked rice or pasta.  4 oz (113 g) cooked dried beans.  4 oz (113 g) starchy vegetable, such as peas, corn, or potatoes.  4 oz (113 g) hot cereal.  4 oz (113 g) mashed potatoes or  of a large baked potato.  4 oz (113 g) canned or frozen fruit.  4 oz (120 mL) fruit juice.  4-6 crackers.  6 chicken nuggets.  6 oz (170 g) unsweetened dry cereal.  6 oz (170 g) plain fat-free yogurt or yogurt sweetened with artificial sweeteners.  8 oz (240 mL) milk.  8 oz (170 g) fresh fruit or one small piece of fruit.  24 oz (680 g) popped popcorn.  Example of carbohydrate counting Sample meal  3 oz (85 g) chicken breast.  6 oz (170 g) brown rice.  4 oz (113 g) corn.  8 oz (240 mL) milk.  8 oz (170 g) strawberries with sugar-free whipped topping. Carbohydrate calculation 1. Identify the foods that contain carbohydrates: ? Rice. ? Corn. ? Milk. ? Strawberries. 2. Calculate how many servings you have of each food: ? 2 servings rice. ? 1 serving corn. ? 1 serving milk. ? 1 serving strawberries. 3. Multiply each number of servings by 15 g: ? 2 servings rice x 15 g = 30 g. ? 1 serving corn x 15 g = 15 g. ? 1 serving milk x 15 g = 15 g. ? 1 serving strawberries x 15 g = 15 g. 4. Add together all of the amounts to find the total grams of carbohydrates eaten: ? 30 g + 15 g + 15 g + 15 g = 75 g of carbohydrates total. This information is not intended to replace advice given to you by your health care provider. Make sure you discuss any questions you have with your health care provider. Document Released: 04/29/2005 Document Revised: 11/17/2015 Document Reviewed: 10/11/2015 Elsevier Interactive Patient Education  2018 [place].  Back Pain, Adult Back pain is very common. The pain often gets better over time. The cause of back pain is usually not dangerous. Most people can learn to manage their back pain on their own. Follow these  instructions at home: Watch your back pain for any changes. The following actions may help to lessen any pain you are feeling:  Stay active. Start with short walks on flat ground if you can. Try to walk farther each day.  Exercise regularly as told by your doctor. Exercise helps your back heal faster. It also helps avoid future injury by keeping your muscles strong and flexible.  Do not sit, drive, or stand in one place for more than 30 minutes.  Do not stay in bed. Resting more than 1-2 days can slow down your recovery.  Be careful when you bend or lift an object. Use good form when lifting: ? Bend at your knees. ? Keep the object close to your body. ? Do not twist.  Sleep on a firm  mattress. Lie on your side, and bend your knees. If you lie on your back, put a pillow under your knees.  Take medicines only as told by your doctor.  Put ice on the injured area. ? Put ice in a plastic bag. ? Place a towel between your skin and the bag. ? Leave the ice on for 20 minutes, 2-3 times a day for the first 2-3 days. After that, you can switch between ice and heat packs.  Avoid feeling anxious or stressed. Find good ways to deal with stress, such as exercise.  Maintain a healthy weight. Extra weight puts stress on your back.  Contact a doctor if:  You have pain that does not go away with rest or medicine.  You have worsening pain that goes down into your legs or buttocks.  You have pain that does not get better in one week.  You have pain at night.  You lose weight.  You have a fever or chills. Get help right away if:  You cannot control when you poop (bowel movement) or pee (urinate).  Your arms or legs feel weak.  Your arms or legs lose feeling (numbness).  You feel sick to your stomach (nauseous) or throw up (vomit).  You have belly (abdominal) pain.  You feel like you may pass out (faint). This information is not intended to replace advice given to you by your health  care provider. Make sure you discuss any questions you have with your health care provider. Document Released: 10/16/2007 Document Revised: 10/05/2015 Document Reviewed: 08/31/2013 Elsevier Interactive Patient Education  [employer].

## 2016-12-31 ENCOUNTER — Other Ambulatory Visit: Payer: Self-pay | Admitting: Family Medicine

## 2017-03-10 DIAGNOSIS — G4733 Obstructive sleep apnea (adult) (pediatric): Secondary | ICD-10-CM | POA: Diagnosis not present

## 2017-03-20 ENCOUNTER — Other Ambulatory Visit: Payer: Self-pay | Admitting: Family Medicine

## 2017-05-20 ENCOUNTER — Encounter: Payer: Self-pay | Admitting: Family Medicine

## 2017-05-20 ENCOUNTER — Ambulatory Visit (INDEPENDENT_AMBULATORY_CARE_PROVIDER_SITE_OTHER): Payer: PPO | Admitting: Family Medicine

## 2017-05-20 VITALS — BP 146/76 | HR 87 | Temp 98.2°F | Resp 16 | Ht 69.0 in | Wt 240.0 lb

## 2017-05-20 DIAGNOSIS — Z0000 Encounter for general adult medical examination without abnormal findings: Secondary | ICD-10-CM | POA: Insufficient documentation

## 2017-05-20 DIAGNOSIS — R739 Hyperglycemia, unspecified: Secondary | ICD-10-CM | POA: Diagnosis not present

## 2017-05-20 DIAGNOSIS — R351 Nocturia: Secondary | ICD-10-CM | POA: Diagnosis not present

## 2017-05-20 DIAGNOSIS — E669 Obesity, unspecified: Secondary | ICD-10-CM

## 2017-05-20 DIAGNOSIS — G4733 Obstructive sleep apnea (adult) (pediatric): Secondary | ICD-10-CM

## 2017-05-20 DIAGNOSIS — K5790 Diverticulosis of intestine, part unspecified, without perforation or abscess without bleeding: Secondary | ICD-10-CM | POA: Diagnosis not present

## 2017-05-20 DIAGNOSIS — I10 Essential (primary) hypertension: Secondary | ICD-10-CM

## 2017-05-20 DIAGNOSIS — E785 Hyperlipidemia, unspecified: Secondary | ICD-10-CM

## 2017-05-20 DIAGNOSIS — M25511 Pain in right shoulder: Secondary | ICD-10-CM | POA: Diagnosis not present

## 2017-05-20 DIAGNOSIS — Z0001 Encounter for general adult medical examination with abnormal findings: Secondary | ICD-10-CM | POA: Diagnosis not present

## 2017-05-20 DIAGNOSIS — J310 Chronic rhinitis: Secondary | ICD-10-CM

## 2017-05-20 LAB — HEMOGLOBIN A1C: HEMOGLOBIN A1C: 6 % (ref 4.6–6.5)

## 2017-05-20 LAB — COMPREHENSIVE METABOLIC PANEL
ALBUMIN: 4.4 g/dL (ref 3.5–5.2)
ALK PHOS: 65 U/L (ref 39–117)
ALT: 33 U/L (ref 0–53)
AST: 26 U/L (ref 0–37)
BILIRUBIN TOTAL: 0.8 mg/dL (ref 0.2–1.2)
BUN: 12 mg/dL (ref 6–23)
CO2: 33 mEq/L — ABNORMAL HIGH (ref 19–32)
Calcium: 9.5 mg/dL (ref 8.4–10.5)
Chloride: 99 mEq/L (ref 96–112)
Creatinine, Ser: 0.84 mg/dL (ref 0.40–1.50)
GFR: 95.37 mL/min (ref >60.00)
GLUCOSE: 118 mg/dL — AB (ref 70–99)
Potassium: 3.8 mEq/L (ref 3.5–5.1)
Sodium: 139 mEq/L (ref 135–145)
TOTAL PROTEIN: 7.5 g/dL (ref 6.0–8.3)

## 2017-05-20 LAB — LIPID PANEL
CHOLESTEROL: 169 mg/dL (ref 0–200)
HDL: 48.9 mg/dL (ref >39.00)
LDL Cholesterol: 98 mg/dL (ref 0–99)
NonHDL: 119.83
Total CHOL/HDL Ratio: 3
Triglycerides: 111 mg/dL (ref 0.0–149.0)
VLDL: 22.2 mg/dL (ref 0.0–40.0)

## 2017-05-20 LAB — CBC
HCT: 43.3 % (ref 39.0–52.0)
HEMOGLOBIN: 14.7 g/dL (ref 13.0–17.0)
MCHC: 33.9 g/dL (ref 30.0–36.0)
MCV: 96.2 fl (ref 78.0–100.0)
Platelets: 269 10*3/uL (ref 150.0–400.0)
RBC: 4.51 Mil/uL (ref 4.22–5.81)
RDW: 12.8 % (ref 11.5–15.5)
WBC: 7.3 10*3/uL (ref 4.0–10.5)

## 2017-05-20 LAB — PSA: PSA: 1.59 ng/mL (ref 0.10–4.00)

## 2017-05-20 LAB — TSH: TSH: 2.27 u[IU]/mL (ref 0.35–4.50)

## 2017-05-20 MED ORDER — TRIAMCINOLONE ACETONIDE 0.1 % EX CREA: 1.0000 "application " | TOPICAL_CREAM | Freq: Two times a day (BID) | CUTANEOUS | 0 refills | Status: DC

## 2017-05-20 NOTE — Assessment & Plan Note (Signed)
Encouraged DASH diet, decrease po intake and increase exercise as tolerated. Needs 7-8 hours of sleep nightly. Avoid trans fats, eat small, frequent meals every 4-5 hours with lean proteins, complex carbs and healthy fats. Minimize simple carbs.consider bariatric referral

## 2017-05-20 NOTE — Assessment & Plan Note (Signed)
Well controlled, no changes to meds. Encouraged heart healthy diet such as the DASH diet and exercise as tolerated.

## 2017-05-20 NOTE — Assessment & Plan Note (Signed)
Encouraged moist heat and gentle stretching as tolerated. May try NSAIDs and prescription meds as directed and report if symptoms worsen or seek immediate care, try Lidocaine prn. Consider sports med referral if worsens or persists. He will call if ready for referral

## 2017-05-20 NOTE — Assessment & Plan Note (Signed)
Patient encouraged to maintain heart healthy diet, regular exercise, adequate sleep. Consider daily probiotics. Take medications as prescribed. Labs ordered

## 2017-05-20 NOTE — Progress Notes (Signed)
Subjective:  I acted as a Education administrator for [employer]. Yancey Flemings, [place]   Patient ID: Christian [name], male    DOB: 1945-09-04, 72 y.o.   MRN: [id number]  Chief Complaint  Patient presents with  . Annual Exam    HPI  Patient is in today for annual exam and follow up on chronic medical concerns including hypertension, hyperlipidemia and hyperglycemia. No polyuria or polydipsia. No recent febrile illness or hospitalizations. Is complaining of his right shoulder hurting more over the past couple of months. No recent falls or trauma. He otherwise notes good health. Is eating well and staying active. Doing well with ADLs. Denies CP/palp/SOB/HA/congestion/fevers/GI or GU c/o. Taking meds as prescribed  Patient Care Team: Mosie Lukes, MD as PCP - General (Family Medicine)   Past Medical History:  Diagnosis Date  . Back pain 06/04/2015  . DVT of lower limb, acute ([place]) 01/13/2015  . High blood pressure   . High cholesterol   . History of chicken pox 01/13/2015  . Hyperlipidemia, mild 01/13/2015  . Hypokalemia 01/13/2015  . Obesity   . Verrucous skin lesion 02/08/2016    History reviewed. No pertinent surgical history.  Family History  Problem Relation Age of Onset  . Stroke Father        from surgery  . Emphysema Father        smoker  . Heart disease Father        carotid artery disease, vasculopathy  . Allergic Disorder Sister     Social History   Socioeconomic History  . Marital status: Married    Spouse name: Not on file  . Number of children: 1  . Years of education: Not on file  . Highest education level: Not on file  Social Needs  . Financial resource strain: Not on file  . Food insecurity - worry: Not on file  . Food insecurity - inability: Not on file  . Transportation needs - medical: Not on file  . Transportation needs - non-medical: Not on file  Occupational History  . Occupation: Retired  Tobacco Use  . Smoking status: Never Smoker  . Smokeless tobacco: Never Used    Substance and Sexual Activity  . Alcohol use: Yes    Alcohol/week: 0.0 oz    Comment: 2-3 glasses of wine every night  . Drug use: No  . 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 Not on file  Social History Narrative  . Not on file    Outpatient Medications Prior to Visit  Medication Sig Dispense Refill  . aspirin 81 MG tablet Take 81 mg by mouth daily. Reported on 05/06/2015    . hydrochlorothiazide (HYDRODIURIL) 25 MG tablet TAKE 1 TABLET BY MOUTH EVERY DAY 90 tablet 1  . losartan (COZAAR) 100 MG tablet TAKE 1 TABLET BY MOUTH EVERY DAY 90 tablet 1  . Multiple Vitamin (MULTIVITAMIN) tablet Take 1 tablet by mouth daily.    . pravastatin (PRAVACHOL) 40 MG tablet TAKE 1 TABLET BY MOUTH EVERY DAY 90 tablet 2   No facility-administered medications prior to visit.     Allergies  Allergen Reactions  . Codeine Anaphylaxis    Review of Systems  Constitutional: Negative for chills, fever and malaise/fatigue.  HENT: Negative for congestion and hearing loss.   Eyes: Negative for discharge.  Respiratory: Negative for cough, sputum production and shortness of breath.   Cardiovascular: Negative for  chest pain, palpitations and leg swelling.  Gastrointestinal: Negative for abdominal pain, blood in stool, constipation, diarrhea, heartburn, nausea and vomiting.  Genitourinary: Negative for dysuria, frequency, hematuria and urgency.  Musculoskeletal: Positive for joint pain. Negative for back pain, falls and myalgias.  Skin: Negative for rash.  Neurological: Negative for dizziness, sensory change, loss of consciousness, weakness and headaches.  Endo/Heme/Allergies: Negative for environmental allergies. Does not bruise/bleed easily.  Psychiatric/Behavioral: Negative for depression and suicidal ideas. The patient is not nervous/anxious and does not have insomnia.        Objective:    Physical  Exam  Constitutional: He is oriented to person, place, and time. He appears well-developed and well-nourished. No distress.  HENT:  Head: Normocephalic and atraumatic.  Eyes: Conjunctivae are normal.  Neck: Neck supple. No thyromegaly present.  Cardiovascular: Normal rate, regular rhythm and normal heart sounds.  No murmur heard. Pulmonary/Chest: Effort normal and breath sounds normal. No respiratory distress. He has no wheezes.  Abdominal: Soft. Bowel sounds are normal. He exhibits no mass. There is no tenderness.  Musculoskeletal: He exhibits no edema.  Lymphadenopathy:    He has no cervical adenopathy.  Neurological: He is alert and oriented to person, place, and time.  Skin: Skin is warm and dry.  Psychiatric: He has a normal mood and affect. His behavior is normal.    BP (!) 146/76 (BP Location: Left Arm, Patient Position: Sitting, Cuff Size: Large)   Pulse 87   Temp 98.2 F (36.8 C) (Oral)   Resp 16   Ht _0  (1.753 m)   Wt 240 lb (108.9 kg)   SpO2 97%   BMI 35.44 kg/m  Wt Readings from Last 3 Encounters:  05/20/17 240 lb (108.9 kg)  11/04/16 233 lb 3.2 oz (105.8 kg)  02/14/16 235 lb 6.4 oz (106.8 kg)   BP Readings from Last 3 Encounters:  05/20/17 (!) 146/76  11/04/16 (!) 142/78  02/14/16 136/68     Immunization History  Administered Date(s) Administered  . Influenza, High Dose Seasonal PF 02/08/2016, 02/09/2017  . Influenza,inj,Quad PF,6+ Mos 02/09/2015  . Influenza-Unspecified 03/13/2014  . Pneumococcal Conjugate-13 03/13/2013  . Pneumococcal Polysaccharide-23 04/20/2015  . Td 01/12/2007  . Zoster 03/13/2014    Health Maintenance  Topic Date Due  . TETANUS/TDAP  01/11/2017  . COLONOSCOPY  03/26/2026  . INFLUENZA VACCINE  Completed  . Hepatitis C Screening  Completed  . PNA vac Low Risk Adult  Completed    Lab Results  Component Value Date   WBC 7.3 05/20/2017   HGB 14.7 05/20/2017   HCT 43.3 05/20/2017   PLT 269.0 05/20/2017   GLUCOSE 118 (H)  05/20/2017   CHOL 169 05/20/2017   TRIG 111.0 05/20/2017   HDL 48.90 05/20/2017   LDLCALC 98 05/20/2017   ALT 33 05/20/2017   AST 26 05/20/2017   NA 139 05/20/2017   K 3.8 05/20/2017   CL 99 05/20/2017   CREATININE 0.84 05/20/2017   BUN 12 05/20/2017   CO2 33 (H) 05/20/2017   TSH 2.27 05/20/2017   PSA 1.59 05/20/2017   HGBA1C 6.0 05/20/2017    Lab Results  Component Value Date   TSH 2.27 05/20/2017   Lab Results  Component Value Date   WBC 7.3 05/20/2017   HGB 14.7 05/20/2017   HCT 43.3 05/20/2017   MCV 96.2 05/20/2017   PLT 269.0 05/20/2017   Lab Results  Component Value Date   NA 139 05/20/2017   K 3.8 05/20/2017  CO2 33 (H) 05/20/2017   GLUCOSE 118 (H) 05/20/2017   BUN 12 05/20/2017   CREATININE 0.84 05/20/2017   BILITOT 0.8 05/20/2017   ALKPHOS 65 05/20/2017   AST 26 05/20/2017   ALT 33 05/20/2017   PROT 7.5 05/20/2017   ALBUMIN 4.4 05/20/2017   CALCIUM 9.5 05/20/2017   ANIONGAP 10 12/21/2014   GFR 95.37 05/20/2017   Lab Results  Component Value Date   CHOL 169 05/20/2017   Lab Results  Component Value Date   HDL 48.90 05/20/2017   Lab Results  Component Value Date   LDLCALC 98 05/20/2017   Lab Results  Component Value Date   TRIG 111.0 05/20/2017   Lab Results  Component Value Date   CHOLHDL 3 05/20/2017   Lab Results  Component Value Date   HGBA1C 6.0 05/20/2017         Assessment & Plan:   Problem List Items Addressed This Visit    Obstructive sleep apnea    Uses nightly no trouble      Hyperlipidemia, mild    Tolerating statin, encouraged heart healthy diet, avoid trans fats, minimize simple carbs and saturated fats. Increase exercise as tolerated      Relevant Orders   Lipid panel (Completed)   Lipid panel   Obesity    Encouraged DASH diet, decrease po intake and increase exercise as tolerated. Needs 7-8 hours of sleep nightly. Avoid trans fats, eat small, frequent meals every 4-5 hours with lean proteins, complex  carbs and healthy fats. Minimize simple carbs.consider bariatric referral      Benign essential HTN    Well controlled, no changes to meds. Encouraged heart healthy diet such as the DASH diet and exercise as tolerated.       Relevant Orders   CBC (Completed)   Comprehensive metabolic panel (Completed)   TSH (Completed)   CBC   Comprehensive metabolic panel   TSH   Hyperglycemia    hgba1c acceptable, minimize simple carbs. Increase exercise as tolerated. Continue current meds      Relevant Orders   Hemoglobin A1c (Completed)   Hemoglobin A1c   Nonallergic rhinitis    flonase daily and nasal saline at bed       Right shoulder pain    Encouraged moist heat and gentle stretching as tolerated. May try NSAIDs and prescription meds as directed and report if symptoms worsen or seek immediate care, try Lidocaine prn. Consider sports med referral if worsens or persists. He will call if ready for referral      Diverticulosis    By colonoscopy he questions if he had a diverticulitis flare in his LUQ recently. A couple of days of pain after eating some poorly cooked red meat.no systemic symptoms and then symptoms resolved. Encouraged high fiber supplement, 64 oz daily. Benefiber is a good choice.       Preventative health care    Patient encouraged to maintain heart healthy diet, regular exercise, adequate sleep. Consider daily probiotics. Take medications as prescribed. Labs ordered        Other Visit Diagnoses    Nocturia    -  Primary   Relevant Orders   PSA (Completed)      I am having Kadien A. Ronda "Tom" start on triamcinolone cream. I am also having him maintain his aspirin, multivitamin, losartan, pravastatin, and hydrochlorothiazide.  Meds ordered this encounter  Medications  . triamcinolone cream (KENALOG) 0.1 %    Sig: Apply 1 application topically 2 (two)  times daily.    Dispense:  30 g    Refill:  0    CMA served as scribe during this visit. History, Physical  and Plan performed by medical provider. Documentation and orders reviewed and attested to.  Penni Homans, MD

## 2017-05-20 NOTE — Assessment & Plan Note (Signed)
flonase daily and nasal saline at bed

## 2017-05-20 NOTE — Assessment & Plan Note (Signed)
Tolerating statin, encouraged heart healthy diet, avoid trans fats, minimize simple carbs and saturated fats. Increase exercise as tolerated 

## 2017-05-20 NOTE — Assessment & Plan Note (Signed)
hgba1c acceptable, minimize simple carbs. Increase exercise as tolerated. Continue current meds

## 2017-05-20 NOTE — Assessment & Plan Note (Signed)
By colonoscopy he questions if he had a diverticulitis flare in his LUQ recently. A couple of days of pain after eating some poorly cooked red meat.no systemic symptoms and then symptoms resolved. Encouraged high fiber supplement, 64 oz daily. Benefiber is a good choice.

## 2017-05-20 NOTE — Patient Instructions (Addendum)
Lidocaine gel or patches for the shoulder made Aspercreme, Salon Pas, or Icy Hot  Consider starting benefiber daily with 64 oz of clear fluids daily.   . Call if worse for referral to ENT  Bring [name] a copy of your healthcare power of attorney and the living   Shingrix is the new shingles shot 2 shots over a 2-6 month window. At pharmacy.     Preventive Care 17 Years and Older, Male Preventive care refers to lifestyle choices and visits with your health care provider that can promote health and wellness. What does preventive care include?  A yearly physical exam. This is also called an annual well check.  Dental exams once or twice a year.  Routine eye exams. Ask your health care provider how often you should have your eyes checked.  Personal lifestyle choices, including: ? Daily care of your teeth and gums. ? Regular physical activity. ? Eating a healthy diet. ? Avoiding tobacco and drug use. ? Limiting alcohol use. ? Practicing safe sex. ? Taking low doses of aspirin every day. ? Taking vitamin and mineral supplements as recommended by your health care provider. What happens during an annual well check? The services and screenings done by your health care provider during your annual well check will depend on your age, overall health, lifestyle risk factors, and family history of disease. Counseling Your health care provider may ask you questions about your:  Alcohol use.  Tobacco use.  Drug use.  Emotional well-being.  Home and relationship well-being.  Sexual activity.  Eating habits.  History of falls.  Memory and ability to understand (cognition).  Work and work Statistician.  Screening You may have the following tests or measurements:  Height, weight, and BMI.  Blood pressure.  Lipid and cholesterol levels. These may be checked every 5 years, or more frequently if you are over 61 years old.  Skin check.  Lung cancer screening. You may have this  screening every year starting at age 67 if you have a 30-pack-year history of smoking and currently smoke or have quit within the past 15 years.  Fecal occult blood test (FOBT) of the stool. You may have this test every year starting at age 32.  Flexible sigmoidoscopy or colonoscopy. You may have a sigmoidoscopy every 5 years or a colonoscopy every 10 years starting at age 58.  Prostate cancer screening. Recommendations will vary depending on your family history and other risks.  Hepatitis C blood test.  Hepatitis B blood test.  Sexually transmitted disease (STD) testing.  Diabetes screening. This is done by checking your blood sugar (glucose) after you have not eaten for a while (fasting). You may have this done every 1-3 years.  Abdominal aortic aneurysm (AAA) screening. You may need this if you are a current or former smoker.  Osteoporosis. You may be screened starting at age 26 if you are at high risk.  Talk with your health care provider about your test results, treatment options, and if necessary, the need for more tests. Vaccines Your health care provider may recommend certain vaccines, such as:  Influenza vaccine. This is recommended every year.  Tetanus, diphtheria, and acellular pertussis (Tdap, Td) vaccine. You may need a Td booster every 10 years.  Varicella vaccine. You may need this if you have not been vaccinated.  Zoster vaccine. You may need this after age 75.  Measles, mumps, and rubella (MMR) vaccine. You may need at least one dose of MMR if you were born  in 1957 or later. You may also need a second dose.  Pneumococcal 13-valent conjugate (PCV13) vaccine. One dose is recommended after age 43.  Pneumococcal polysaccharide (PPSV23) vaccine. One dose is recommended after age 41.  Meningococcal vaccine. You may need this if you have certain conditions.  Hepatitis A vaccine. You may need this if you have certain conditions or if you travel or work in places where  you may be exposed to hepatitis A.  Hepatitis B vaccine. You may need this if you have certain conditions or if you travel or work in places where you may be exposed to hepatitis B.  Haemophilus influenzae type b (Hib) vaccine. You may need this if you have certain risk factors.  Talk to your health care provider about which screenings and vaccines you need and how often you need them. This information is not intended to replace advice given to you by your health care provider. Make sure you discuss any questions you have with your health care provider. Document Released: 05/26/2015 Document Revised: 01/17/2016 Document Reviewed: 02/28/2015 Elsevier Interactive Patient Education  [employer].

## 2017-05-20 NOTE — Assessment & Plan Note (Signed)
Uses nightly no trouble

## 2017-06-04 DIAGNOSIS — M25511 Pain in right shoulder: Secondary | ICD-10-CM | POA: Diagnosis not present

## 2017-06-04 DIAGNOSIS — M7541 Impingement syndrome of right shoulder: Secondary | ICD-10-CM | POA: Diagnosis not present

## 2017-06-26 ENCOUNTER — Other Ambulatory Visit: Payer: Self-pay | Admitting: Family Medicine

## 2017-07-10 DIAGNOSIS — H25013 Cortical age-related cataract, bilateral: Secondary | ICD-10-CM | POA: Diagnosis not present

## 2017-07-10 DIAGNOSIS — H2513 Age-related nuclear cataract, bilateral: Secondary | ICD-10-CM | POA: Diagnosis not present

## 2017-09-10 ENCOUNTER — Other Ambulatory Visit: Payer: Self-pay | Admitting: Family Medicine

## 2017-09-15 DIAGNOSIS — R151 Fecal smearing: Secondary | ICD-10-CM | POA: Diagnosis not present

## 2017-09-15 DIAGNOSIS — R143 Flatulence: Secondary | ICD-10-CM | POA: Diagnosis not present

## 2017-09-15 DIAGNOSIS — K5900 Constipation, unspecified: Secondary | ICD-10-CM | POA: Diagnosis not present

## 2017-09-25 ENCOUNTER — Other Ambulatory Visit: Payer: Self-pay | Admitting: Family Medicine

## 2017-10-30 NOTE — Progress Notes (Deleted)
Subjective:   Christian [name] is a 72 y.o. male who presents for Medicare Annual/Subsequent preventive examination.  Review of Systems: No ROS.  Medicare Wellness Visit. Additional risk factors are reflected in the social history.   Sleep patterns: Home Safety/Smoke Alarms: Feels safe in home. Smoke alarms in place.  Living environment; residence and Firearm Safety:  Male:   CCS-03/26/16     PSA-  Lab Results  Component Value Date   PSA 1.59 05/20/2017       Objective:    Vitals: There were no vitals taken for this visit.  There is no height or weight on file to calculate BMI.  Advanced Directives 11/04/2016 12/21/2014 12/21/2014  Does Patient Have a Medical Advance Directive? Yes Yes Yes  Type of Paramedic of [place];Living will [place];Living will -  Copy of [place] in Chart? No - copy requested - -    Tobacco Social History   Tobacco Use  Smoking Status Never Smoker  Smokeless Tobacco Never Used     Counseling given: Not Answered   Clinical Intake:                       Past Medical History:  Diagnosis Date  . Back pain 06/04/2015  . DVT of lower limb, acute ([place]) 01/13/2015  . High blood pressure   . High cholesterol   . History of chicken pox 01/13/2015  . Hyperlipidemia, mild 01/13/2015  . Hypokalemia 01/13/2015  . Obesity   . Verrucous skin lesion 02/08/2016   No past surgical history on file. Family History  Problem Relation Age of Onset  . Stroke Father        from surgery  . Emphysema Father        smoker  . Heart disease Father        carotid artery disease, vasculopathy  . Allergic Disorder Sister    Social History   Socioeconomic History  . Marital status: Married    Spouse name: Not on file  . Number of children: 1  . Years of education: Not on file  . Highest education level: Not on file  Occupational History  . Occupation: Retired  Scientific laboratory technician  .  Financial resource strain: Not on file  . Food insecurity:    Worry: Not on file    Inability: Not on file  . Transportation needs:    Medical: Not on file    Non-medical: Not on file  Tobacco Use  . Smoking status: Never Smoker  . Smokeless tobacco: Never Used  Substance and Sexual Activity  . Alcohol use: Yes    Alcohol/week: 0.0 oz    Comment: 2-3 glasses of wine every night  . Drug use: No  . 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 Physical activity:    Days per week: Not on file    Minutes per session: Not on file  . Stress: Not on file  Relationships  . Social connections:    Talks on phone: Not on file    Gets together: Not on file    Attends religious service: Not on file    Active member of club or organization: Not on file    Attends meetings of clubs or organizations: Not on file    Relationship status: Not on file  Other Topics Concern  . Not  on file  Social History Narrative  . Not on file    Outpatient Encounter Medications as of 11/06/2017  Medication Sig  . aspirin 81 MG tablet Take 81 mg by mouth daily. Reported on 05/06/2015  . hydrochlorothiazide (HYDRODIURIL) 25 MG tablet TAKE 1 TABLET BY MOUTH EVERY DAY  . losartan (COZAAR) 100 MG tablet TAKE 1 TABLET BY MOUTH EVERY DAY  . Multiple Vitamin (MULTIVITAMIN) tablet Take 1 tablet by mouth daily.  . pravastatin (PRAVACHOL) 40 MG tablet TAKE 1 TABLET BY MOUTH EVERY DAY  . triamcinolone cream (KENALOG) 0.1 % Apply 1 application topically 2 (two) times daily.   No facility-administered encounter medications on file as of 11/06/2017.     Activities of Daily Living In your present state of health, do you have any difficulty performing the following activities: 11/04/2016  Hearing? N  Vision? N  Difficulty concentrating or making decisions? N  Walking or climbing stairs? N  Dressing or bathing? N  Doing errands,  shopping? N  Preparing Food and eating ? N  Using the Toilet? N  In the past six months, have you accidently leaked urine? N  Do you have problems with loss of bowel control? N  Managing your Medications? N  Managing your Finances? N  Housekeeping or managing your Housekeeping? N  Some recent data might be hidden    Patient Care Team: Mosie Lukes, MD as PCP - General (Family Medicine)   Assessment:   This is a routine wellness examination for [facility]. Physical assessment deferred to PCP.  Exercise Activities and Dietary recommendations   Diet (meal preparation, eat out, water intake, caffeinated beverages, dairy products, fruits and vegetables): {Desc; diets:16563} Breakfast: Lunch:  Dinner:      Goals    None      Fall Risk Fall Risk  11/04/2016 04/20/2015  Falls in the past year? No No    Depression Screen PHQ 2/9 Scores 11/04/2016 04/20/2015  PHQ - 2 Score 0 0    Cognitive Function MMSE - Mini Mental State Exam 11/04/2016  Orientation to time 5  Orientation to Place 5  Registration 3  Attention/ Calculation 4  Recall 2  Language- name 2 objects 2  Language- repeat 1  Language- follow 3 step command 3  Language- read & follow direction 1  Write a sentence 1  Copy design 1  Total score 28        Immunization History  Administered Date(s) Administered  . Influenza, High Dose Seasonal PF 02/08/2016, 02/09/2017  . Influenza,inj,Quad PF,6+ Mos 02/09/2015  . Influenza-Unspecified 03/13/2014  . Pneumococcal Conjugate-13 03/13/2013  . Pneumococcal Polysaccharide-23 04/20/2015  . Td 01/12/2007  . Zoster 03/13/2014     Screening Tests Health Maintenance  Topic Date Due  . TETANUS/TDAP  01/11/2017  . INFLUENZA VACCINE  12/11/2017  . COLONOSCOPY  03/26/2026  . Hepatitis C Screening  Completed  . PNA vac Low Risk Adult  Completed       Plan:   ***  I have personally reviewed and noted the following in the patient's chart:   . Medical and social  history . Use of alcohol, tobacco or illicit drugs  . Current medications and supplements . Functional ability and status . Nutritional status . Physical activity . Advanced directives . List of other physicians . Hospitalizations, surgeries, and ER visits in previous 12 months . Vitals . Screenings to include cognitive, depression, and falls . Referrals and appointments  In addition, I have reviewed and discussed with  patient certain preventive protocols, quality metrics, and best practice recommendations. A written personalized care plan for preventive services as well as general preventive health recommendations were provided to patient.     Shela Nevin, [place]  10/30/2017

## 2017-11-04 DIAGNOSIS — H25013 Cortical age-related cataract, bilateral: Secondary | ICD-10-CM | POA: Diagnosis not present

## 2017-11-04 DIAGNOSIS — H25043 Posterior subcapsular polar age-related cataract, bilateral: Secondary | ICD-10-CM | POA: Diagnosis not present

## 2017-11-04 DIAGNOSIS — H2513 Age-related nuclear cataract, bilateral: Secondary | ICD-10-CM | POA: Diagnosis not present

## 2017-11-04 DIAGNOSIS — H40013 Open angle with borderline findings, low risk, bilateral: Secondary | ICD-10-CM | POA: Diagnosis not present

## 2017-11-04 DIAGNOSIS — H18413 Arcus senilis, bilateral: Secondary | ICD-10-CM | POA: Diagnosis not present

## 2017-11-04 DIAGNOSIS — H2512 Age-related nuclear cataract, left eye: Secondary | ICD-10-CM | POA: Diagnosis not present

## 2017-11-06 ENCOUNTER — Ambulatory Visit: Payer: PPO | Admitting: *Deleted

## 2017-11-17 ENCOUNTER — Other Ambulatory Visit (INDEPENDENT_AMBULATORY_CARE_PROVIDER_SITE_OTHER): Payer: PPO

## 2017-11-17 ENCOUNTER — Other Ambulatory Visit: Payer: Self-pay

## 2017-11-17 DIAGNOSIS — I10 Essential (primary) hypertension: Secondary | ICD-10-CM | POA: Diagnosis not present

## 2017-11-17 DIAGNOSIS — R739 Hyperglycemia, unspecified: Secondary | ICD-10-CM | POA: Diagnosis not present

## 2017-11-17 DIAGNOSIS — E785 Hyperlipidemia, unspecified: Secondary | ICD-10-CM | POA: Diagnosis not present

## 2017-11-17 LAB — LIPID PANEL
CHOL/HDL RATIO: 3
CHOLESTEROL: 159 mg/dL (ref 0–200)
HDL: 50.6 mg/dL (ref >39.00)
LDL CALC: 86 mg/dL (ref 0–99)
NonHDL: 108.45
TRIGLYCERIDES: 111 mg/dL (ref 0.0–149.0)
VLDL: 22.2 mg/dL (ref 0.0–40.0)

## 2017-11-17 LAB — CBC
HEMATOCRIT: 41.7 % (ref 39.0–52.0)
Hemoglobin: 14.3 g/dL (ref 13.0–17.0)
MCHC: 34.2 g/dL (ref 30.0–36.0)
MCV: 94.7 fl (ref 78.0–100.0)
Platelets: 215 10*3/uL (ref 150.0–400.0)
RBC: 4.41 Mil/uL (ref 4.22–5.81)
RDW: 13.6 % (ref 11.5–15.5)
WBC: 6.4 10*3/uL (ref 4.0–10.5)

## 2017-11-17 LAB — HEMOGLOBIN A1C: HEMOGLOBIN A1C: 5.9 % (ref 4.6–6.5)

## 2017-11-17 LAB — COMPREHENSIVE METABOLIC PANEL
ALBUMIN: 4.2 g/dL (ref 3.5–5.2)
ALT: 36 U/L (ref 0–53)
AST: 27 U/L (ref 0–37)
Alkaline Phosphatase: 61 U/L (ref 39–117)
BILIRUBIN TOTAL: 0.6 mg/dL (ref 0.2–1.2)
BUN: 11 mg/dL (ref 6–23)
CHLORIDE: 101 meq/L (ref 96–112)
CO2: 30 meq/L (ref 19–32)
CREATININE: 0.86 mg/dL (ref 0.40–1.50)
Calcium: 9.6 mg/dL (ref 8.4–10.5)
GFR: 92.69 mL/min (ref >60.00)
Glucose, Bld: 103 mg/dL — ABNORMAL HIGH (ref 70–99)
Potassium: 3.7 mEq/L (ref 3.5–5.1)
SODIUM: 139 meq/L (ref 135–145)
Total Protein: 7.2 g/dL (ref 6.0–8.3)

## 2017-11-17 LAB — TSH: TSH: 3.34 u[IU]/mL (ref 0.35–4.50)

## 2017-11-20 ENCOUNTER — Encounter: Payer: Self-pay | Admitting: Family Medicine

## 2017-11-20 ENCOUNTER — Ambulatory Visit (INDEPENDENT_AMBULATORY_CARE_PROVIDER_SITE_OTHER): Payer: PPO | Admitting: Family Medicine

## 2017-11-20 DIAGNOSIS — R739 Hyperglycemia, unspecified: Secondary | ICD-10-CM

## 2017-11-20 DIAGNOSIS — E785 Hyperlipidemia, unspecified: Secondary | ICD-10-CM

## 2017-11-20 DIAGNOSIS — E669 Obesity, unspecified: Secondary | ICD-10-CM | POA: Diagnosis not present

## 2017-11-20 DIAGNOSIS — H269 Unspecified cataract: Secondary | ICD-10-CM | POA: Insufficient documentation

## 2017-11-20 DIAGNOSIS — I10 Essential (primary) hypertension: Secondary | ICD-10-CM | POA: Diagnosis not present

## 2017-11-20 NOTE — Assessment & Plan Note (Signed)
Tolerating statin, encouraged heart healthy diet, avoid trans fats, minimize simple carbs and saturated fats. Increase exercise as tolerated 

## 2017-11-20 NOTE — Assessment & Plan Note (Signed)
Has surgery for both eyes next month

## 2017-11-20 NOTE — Progress Notes (Signed)
Subjective:  I acted as a Education administrator for Dr. Charlett Blake. [place], [place]  Patient ID: Christian [name], male    DOB: 03-Feb-1946, 72 y.o.   MRN: [id number]  Chief Complaint  Patient presents with  . Follow-up  . Hyperlipidemia    HPI  Patient is in today for 6 month follow up. He is following up on his hyperlipidemia and other medical  concerns. He feels well today. He had some trouble with increased gaseousness and incontinence last month but that has improved. He is having both cataracts removed next month. No polyuria or polydipsia. Denies CP/palp/SOB/HA/congestion/fevers/GI or GU c/o. Taking meds as prescribed Patient Care Team: Mosie Lukes, MD as PCP - General (Family Medicine)   Past Medical History:  Diagnosis Date  . Back pain 06/04/2015  . DVT of lower limb, acute ([place]) 01/13/2015  . High blood pressure   . High cholesterol   . History of chicken pox 01/13/2015  . Hyperlipidemia, mild 01/13/2015  . Hypokalemia 01/13/2015  . Obesity   . Verrucous skin lesion 02/08/2016    History reviewed. No pertinent surgical history.  Family History  Problem Relation Age of Onset  . Stroke Father        from surgery  . Emphysema Father        smoker  . Heart disease Father        carotid artery disease, vasculopathy  . Allergic Disorder Sister     Social History   Socioeconomic History  . Marital status: Married    Spouse name: Not on file  . Number of children: 1  . Years of education: Not on file  . Highest education level: Not on file  Occupational History  . Occupation: Retired  Scientific laboratory technician  . Financial resource strain: Not on file  . Food insecurity:    Worry: Not on file    Inability: Not on file  . Transportation needs:    Medical: Not on file    Non-medical: Not on file  Tobacco Use  . Smoking status: Never Smoker  . Smokeless tobacco: Never Used  Substance and Sexual Activity  . Alcohol use: Yes    Alcohol/week: 0.0 oz    Comment: 2-3 glasses of wine every night  .  Drug use: No  . 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 Physical activity:    Days per week: Not on file    Minutes per session: Not on file  . Stress: Not on file  Relationships  . Social connections:    Talks on phone: Not on file    Gets together: Not on file    Attends religious service: Not on file    Active member of club or organization: Not on file    Attends meetings of clubs or organizations: Not on file    Relationship status: Not on file  . Intimate partner violence:    Fear of current or ex partner: Not on file    Emotionally abused: Not on file    Physically abused: Not on file    Forced sexual activity: Not on file  Other Topics Concern  . Not on file  Social History Narrative  . Not on file    Outpatient Medications Prior to Visit  Medication Sig Dispense Refill  . aspirin 81 MG tablet Take 81 mg by mouth daily. Reported on 05/06/2015    . hydrochlorothiazide (HYDRODIURIL)  25 MG tablet TAKE 1 TABLET BY MOUTH EVERY DAY 90 tablet 1  . losartan (COZAAR) 100 MG tablet TAKE 1 TABLET BY MOUTH EVERY DAY 90 tablet 1  . Multiple Vitamin (MULTIVITAMIN) tablet Take 1 tablet by mouth daily.    . pravastatin (PRAVACHOL) 40 MG tablet TAKE 1 TABLET BY MOUTH EVERY DAY 90 tablet 2  . triamcinolone cream (KENALOG) 0.1 % Apply 1 application topically 2 (two) times daily. 30 g 0   No facility-administered medications prior to visit.     Allergies  Allergen Reactions  . Codeine Anaphylaxis    Review of Systems  Constitutional: Negative for fever and malaise/fatigue.  HENT: Negative for congestion.   Eyes: Negative for blurred vision.  Respiratory: Negative for shortness of breath.   Cardiovascular: Negative for chest pain, palpitations and leg swelling.  Gastrointestinal: Negative for abdominal pain, blood in stool and nausea.  Genitourinary: Negative for dysuria and frequency.    Musculoskeletal: Negative for falls.  Skin: Negative for rash.  Neurological: Negative for dizziness, loss of consciousness and headaches.  Endo/Heme/Allergies: Negative for environmental allergies.  Psychiatric/Behavioral: Negative for depression. The patient is not nervous/anxious.        Objective:    Physical Exam  Constitutional: He is oriented to person, place, and time. He appears well-developed and well-nourished. No distress.  HENT:  Head: Normocephalic and atraumatic.  Nose: Nose normal.  Eyes: Right eye exhibits no discharge. Left eye exhibits no discharge.  Neck: Normal range of motion. Neck supple.  Cardiovascular: Normal rate and regular rhythm.  No murmur heard. Pulmonary/Chest: Effort normal and breath sounds normal.  Abdominal: Soft. Bowel sounds are normal. There is no tenderness.  Musculoskeletal: He exhibits no edema.  Neurological: He is alert and oriented to person, place, and time.  Skin: Skin is warm and dry.  Psychiatric: He has a normal mood and affect.  Nursing note and vitals reviewed.   BP (!) 142/82   Pulse 62   Temp 98.4 F (36.9 C) (Oral)   Resp 18   Wt 234 lb 6.4 oz (106.3 kg)   SpO2 98%   BMI 34.61 kg/m  Wt Readings from Last 3 Encounters:  11/20/17 234 lb 6.4 oz (106.3 kg)  05/20/17 240 lb (108.9 kg)  11/04/16 233 lb 3.2 oz (105.8 kg)   BP Readings from Last 3 Encounters:  11/20/17 (!) 142/82  05/20/17 (!) 146/76  11/04/16 (!) 142/78     Immunization History  Administered Date(s) Administered  . Influenza, High Dose Seasonal PF 02/08/2016, 02/09/2017  . Influenza,inj,Quad PF,6+ Mos 02/09/2015  . Influenza-Unspecified 03/13/2014  . Pneumococcal Conjugate-13 03/13/2013  . Pneumococcal Polysaccharide-23 04/20/2015  . Td 01/12/2007  . Zoster 03/13/2014    Health Maintenance  Topic Date Due  . TETANUS/TDAP  01/11/2017  . INFLUENZA VACCINE  12/11/2017  . COLONOSCOPY  03/26/2026  . Hepatitis C Screening  Completed  . PNA  vac Low Risk Adult  Completed    Lab Results  Component Value Date   WBC 6.4 11/17/2017   HGB 14.3 11/17/2017   HCT 41.7 11/17/2017   PLT 215.0 11/17/2017   GLUCOSE 103 (H) 11/17/2017   CHOL 159 11/17/2017   TRIG 111.0 11/17/2017   HDL 50.60 11/17/2017   LDLCALC 86 11/17/2017   ALT 36 11/17/2017   AST 27 11/17/2017   NA 139 11/17/2017   K 3.7 11/17/2017   CL 101 11/17/2017   CREATININE 0.86 11/17/2017   BUN 11 11/17/2017   CO2 30 11/17/2017  TSH 3.34 11/17/2017   PSA 1.59 05/20/2017   HGBA1C 5.9 11/17/2017    Lab Results  Component Value Date   TSH 3.34 11/17/2017   Lab Results  Component Value Date   WBC 6.4 11/17/2017   HGB 14.3 11/17/2017   HCT 41.7 11/17/2017   MCV 94.7 11/17/2017   PLT 215.0 11/17/2017   Lab Results  Component Value Date   NA 139 11/17/2017   K 3.7 11/17/2017   CO2 30 11/17/2017   GLUCOSE 103 (H) 11/17/2017   BUN 11 11/17/2017   CREATININE 0.86 11/17/2017   BILITOT 0.6 11/17/2017   ALKPHOS 61 11/17/2017   AST 27 11/17/2017   ALT 36 11/17/2017   PROT 7.2 11/17/2017   ALBUMIN 4.2 11/17/2017   CALCIUM 9.6 11/17/2017   ANIONGAP 10 12/21/2014   GFR 92.69 11/17/2017   Lab Results  Component Value Date   CHOL 159 11/17/2017   Lab Results  Component Value Date   HDL 50.60 11/17/2017   Lab Results  Component Value Date   LDLCALC 86 11/17/2017   Lab Results  Component Value Date   TRIG 111.0 11/17/2017   Lab Results  Component Value Date   CHOLHDL 3 11/17/2017   Lab Results  Component Value Date   HGBA1C 5.9 11/17/2017         Assessment & Plan:   Problem List Items Addressed This Visit    Hyperlipidemia, mild    Tolerating statin, encouraged heart healthy diet, avoid trans fats, minimize simple carbs and saturated fats. Increase exercise as tolerated      Relevant Orders   Lipid panel   Obesity    Encouraged DASH diet, decrease po intake and increase exercise as tolerated. Needs 7-8 hours of sleep nightly.  Avoid trans fats, eat small, frequent meals every 4-5 hours with lean proteins, complex carbs and healthy fats. Minimize simple carbs.       Benign essential HTN    Well controlled, no changes to meds. Encouraged heart healthy diet such as the DASH diet and exercise as tolerated.       Relevant Orders   CBC   Comprehensive metabolic panel   TSH   Hyperglycemia    hgba1c acceptable, minimize simple carbs. Increase exercise as tolerated. Continue current meds      Relevant Orders   Hemoglobin A1c   Cataract    Has surgery for both eyes next month         I am having Darrian A. Patel "Tom" maintain his aspirin, multivitamin, triamcinolone cream, losartan, hydrochlorothiazide, and pravastatin.  No orders of the defined types were placed in this encounter.   CMA served as Education administrator during this visit. History, Physical and Plan performed by medical provider. Documentation and orders reviewed and attested to.  Penni Homans, MD

## 2017-11-20 NOTE — Assessment & Plan Note (Signed)
Encouraged DASH diet, decrease po intake and increase exercise as tolerated. Needs 7-8 hours of sleep nightly. Avoid trans fats, eat small, frequent meals every 4-5 hours with lean proteins, complex carbs and healthy fats. Minimize simple carbs 

## 2017-11-20 NOTE — Patient Instructions (Addendum)
Encouraged increased hydration and fiber in diet. Daily probiotics. If bowels not moving can use MOM 2 tbls po in 4 oz of warm prune juice by mouth every 2-3 days. If no results then repeat in 4 hours with  Dulcolax suppository pr, may repeat again in 4 more hours as needed. Seek care if symptoms worsen. Consider daily Miralax and/or Dulcolax if symptoms persist. Benefiber twice a day  Cleanse with Witch Hazel astringent each BM  Kegel exercises sets of 10 twice daily  Check BP every other week if stays above 150/95 call, (140/90 is ideal or less) DASH Eating Plan DASH stands for "Dietary Approaches to Stop Hypertension." The DASH eating plan is a healthy eating plan that has been shown to reduce high blood pressure (hypertension). It may also reduce your risk for type 2 diabetes, heart disease, and stroke. The DASH eating plan may also help with weight loss. What are tips for following this plan? General guidelines  Avoid eating more than 2,300 mg (milligrams) of salt (sodium) a day. If you have hypertension, you may need to reduce your sodium intake to 1,500 mg a day.  Limit alcohol intake to no more than 1 drink a day for nonpregnant women and 2 drinks a day for men. One drink equals 12 oz of beer, 5 oz of wine, or 1 oz of hard liquor.  Work with your health care provider to maintain a healthy body weight or to lose weight. Ask what an ideal weight is for you.  Get at least 30 minutes of exercise that causes your heart to beat faster (aerobic exercise) most days of the week. Activities may include walking, swimming, or biking.  Work with your health care provider or diet and nutrition specialist (dietitian) to adjust your eating plan to your individual calorie needs. Reading food labels  Check food labels for the amount of sodium per serving. Choose foods with less than 5 percent of the Daily Value of sodium. Generally, foods with less than 300 mg of sodium per serving fit into this eating  plan.  To find whole grains, look for the word "whole" as the first word in the ingredient list. Shopping  Buy products labeled as "low-sodium" or "no salt added."  Buy fresh foods. Avoid canned foods and premade or frozen meals. Cooking  Avoid adding salt when cooking. Use salt-free seasonings or herbs instead of table salt or sea salt. Check with your health care provider or pharmacist before using salt substitutes.  Do not fry foods. Cook foods using healthy methods such as baking, boiling, grilling, and broiling instead.  Cook with heart-healthy oils, such as olive, canola, soybean, or sunflower oil. Meal planning   Eat a balanced diet that includes: ? 5 or more servings of fruits and vegetables each day. At each meal, try to fill half of your plate with fruits and vegetables. ? Up to 6-8 servings of whole grains each day. ? Less than 6 oz of lean meat, poultry, or fish each day. A 3-oz serving of meat is about the same size as a deck of cards. One egg equals 1 oz. ? 2 servings of low-fat dairy each day. ? A serving of nuts, seeds, or beans 5 times each week. ? Heart-healthy fats. Healthy fats called Omega-3 fatty acids are found in foods such as flaxseeds and coldwater fish, like sardines, salmon, and mackerel.  Limit how much you eat of the following: ? Canned or prepackaged foods. ? Food that is high in  trans fat, such as fried foods. ? Food that is high in saturated fat, such as fatty meat. ? Sweets, desserts, sugary drinks, and other foods with added sugar. ? Full-fat dairy products.  Do not salt foods before eating.  Try to eat at least 2 vegetarian meals each week.  Eat more home-cooked food and less restaurant, buffet, and fast food.  When eating at a restaurant, ask that your food be prepared with less salt or no salt, if possible. What foods are recommended? The items listed may not be a complete list. Talk with your dietitian about what dietary choices are best  for you. Grains Whole-grain or whole-wheat bread. Whole-grain or whole-wheat pasta. Brown rice. [place]row. Bulgur. Whole-grain and low-sodium cereals. Pita bread. Low-fat, low-sodium crackers. Whole-wheat flour tortillas. Vegetables Fresh or frozen vegetables (raw, steamed, roasted, or grilled). Low-sodium or reduced-sodium tomato and vegetable juice. Low-sodium or reduced-sodium tomato sauce and tomato paste. Low-sodium or reduced-sodium canned vegetables. Fruits All fresh, dried, or frozen fruit. Canned fruit in natural juice (without added sugar). Meat and other protein foods Skinless chicken or [place]. Ground chicken or [place]. Pork with fat trimmed off. Fish and seafood. Egg whites. Dried beans, peas, or lentils. Unsalted nuts, nut butters, and seeds. Unsalted canned beans. Lean cuts of beef with fat trimmed off. Low-sodium, lean deli meat. Dairy Low-fat (1%) or fat-free (skim) milk. Fat-free, low-fat, or reduced-fat cheeses. Nonfat, low-sodium ricotta or cottage cheese. Low-fat or nonfat yogurt. Low-fat, low-sodium cheese. Fats and oils Soft margarine without trans fats. Vegetable oil. Low-fat, reduced-fat, or light mayonnaise and salad dressings (reduced-sodium). Canola, safflower, olive, soybean, and sunflower oils. Avocado. Seasoning and other foods Herbs. Spices. Seasoning mixes without salt. Unsalted popcorn and pretzels. Fat-free sweets. What foods are not recommended? The items listed may not be a complete list. Talk with your dietitian about what dietary choices are best for you. Grains Baked goods made with fat, such as croissants, muffins, or some breads. Dry pasta or rice meal packs. Vegetables Creamed or fried vegetables. Vegetables in a cheese sauce. Regular canned vegetables (not low-sodium or reduced-sodium). Regular canned tomato sauce and paste (not low-sodium or reduced-sodium). Regular tomato and vegetable juice (not low-sodium or reduced-sodium). Angie Fava.  Olives. Fruits Canned fruit in a light or heavy syrup. Fried fruit. Fruit in cream or butter sauce. Meat and other protein foods Fatty cuts of meat. Ribs. Fried meat. Berniece Salines. Sausage. Bologna and other processed lunch meats. Salami. Fatback. Hotdogs. Bratwurst. Salted nuts and seeds. Canned beans with added salt. Canned or smoked fish. Whole eggs or egg yolks. Chicken or [place] with skin. Dairy Whole or 2% milk, cream, and half-and-half. Whole or full-fat cream cheese. Whole-fat or sweetened yogurt. Full-fat cheese. Nondairy creamers. Whipped toppings. Processed cheese and cheese spreads. Fats and oils Butter. Stick margarine. Lard. Shortening. Ghee. Bacon fat. Tropical oils, such as coconut, palm kernel, or palm oil. Seasoning and other foods Salted popcorn and pretzels. Onion salt, garlic salt, seasoned salt, table salt, and sea salt. Worcestershire sauce. Tartar sauce. Barbecue sauce. Teriyaki sauce. Soy sauce, including reduced-sodium. Steak sauce. Canned and packaged gravies. Fish sauce. Oyster sauce. Cocktail sauce. Horseradish that you find on the shelf. Ketchup. Mustard. Meat flavorings and tenderizers. Bouillon cubes. Hot sauce and Tabasco sauce. Premade or packaged marinades. Premade or packaged taco seasonings. Relishes. Regular salad dressings. Where to find more information:  National Heart, Lung, and [place]: https://wilson-eaton.com/  American Heart Association: www.heart.org Summary  The DASH eating plan is a healthy eating plan that  has been shown to reduce high blood pressure (hypertension). It may also reduce your risk for type 2 diabetes, heart disease, and stroke.  With the DASH eating plan, you should limit salt (sodium) intake to 2,300 mg a day. If you have hypertension, you may need to reduce your sodium intake to 1,500 mg a day.  When on the DASH eating plan, aim to eat more fresh fruits and vegetables, whole grains, lean proteins, low-fat dairy, and heart-healthy  fats.  Work with your health care provider or diet and nutrition specialist (dietitian) to adjust your eating plan to your individual calorie needs. This information is not intended to replace advice given to you by your health care provider. Make sure you discuss any questions you have with your health care provider. Document Released: 04/18/2011 Document Revised: 04/22/2016 Document Reviewed: 04/22/2016 Elsevier Interactive Patient Education  [employer].

## 2017-11-20 NOTE — Assessment & Plan Note (Signed)
Well controlled, no changes to meds. Encouraged heart healthy diet such as the DASH diet and exercise as tolerated.

## 2017-11-20 NOTE — Assessment & Plan Note (Signed)
hgba1c acceptable, minimize simple carbs. Increase exercise as tolerated. Continue current meds 

## 2018-01-05 DIAGNOSIS — H2513 Age-related nuclear cataract, bilateral: Secondary | ICD-10-CM | POA: Diagnosis not present

## 2018-01-05 DIAGNOSIS — H2512 Age-related nuclear cataract, left eye: Secondary | ICD-10-CM | POA: Diagnosis not present

## 2018-01-06 DIAGNOSIS — H2511 Age-related nuclear cataract, right eye: Secondary | ICD-10-CM | POA: Diagnosis not present

## 2018-01-26 DIAGNOSIS — H2511 Age-related nuclear cataract, right eye: Secondary | ICD-10-CM | POA: Diagnosis not present

## 2018-01-26 DIAGNOSIS — H2513 Age-related nuclear cataract, bilateral: Secondary | ICD-10-CM | POA: Diagnosis not present

## 2018-03-06 ENCOUNTER — Other Ambulatory Visit: Payer: Self-pay | Admitting: Family Medicine

## 2018-04-03 ENCOUNTER — Other Ambulatory Visit: Payer: Self-pay | Admitting: Family Medicine

## 2018-05-07 DIAGNOSIS — G4733 Obstructive sleep apnea (adult) (pediatric): Secondary | ICD-10-CM | POA: Diagnosis not present

## 2018-05-25 ENCOUNTER — Other Ambulatory Visit (INDEPENDENT_AMBULATORY_CARE_PROVIDER_SITE_OTHER): Payer: Medicare HMO

## 2018-05-25 DIAGNOSIS — I10 Essential (primary) hypertension: Secondary | ICD-10-CM

## 2018-05-25 DIAGNOSIS — E785 Hyperlipidemia, unspecified: Secondary | ICD-10-CM | POA: Diagnosis not present

## 2018-05-25 DIAGNOSIS — R739 Hyperglycemia, unspecified: Secondary | ICD-10-CM | POA: Diagnosis not present

## 2018-05-25 LAB — COMPREHENSIVE METABOLIC PANEL
ALBUMIN: 4 g/dL (ref 3.5–5.2)
ALT: 28 U/L (ref 0–53)
AST: 23 U/L (ref 0–37)
Alkaline Phosphatase: 57 U/L (ref 39–117)
BILIRUBIN TOTAL: 0.6 mg/dL (ref 0.2–1.2)
BUN: 11 mg/dL (ref 6–23)
CALCIUM: 9.2 mg/dL (ref 8.4–10.5)
CO2: 28 meq/L (ref 19–32)
Chloride: 102 mEq/L (ref 96–112)
Creatinine, Ser: 0.95 mg/dL (ref 0.40–1.50)
GFR: 82.51 mL/min (ref >60.00)
Glucose, Bld: 109 mg/dL — ABNORMAL HIGH (ref 70–99)
Potassium: 3.5 mEq/L (ref 3.5–5.1)
Sodium: 137 mEq/L (ref 135–145)
Total Protein: 6.9 g/dL (ref 6.0–8.3)

## 2018-05-25 LAB — CBC
HCT: 41.4 % (ref 39.0–52.0)
Hemoglobin: 13.9 g/dL (ref 13.0–17.0)
MCHC: 33.6 g/dL (ref 30.0–36.0)
MCV: 96.1 fl (ref 78.0–100.0)
PLATELETS: 219 10*3/uL (ref 150.0–400.0)
RBC: 4.31 Mil/uL (ref 4.22–5.81)
RDW: 13.1 % (ref 11.5–15.5)
WBC: 5.1 10*3/uL (ref 4.0–10.5)

## 2018-05-25 LAB — TSH: TSH: 2.84 u[IU]/mL (ref 0.35–4.50)

## 2018-05-25 LAB — LIPID PANEL
CHOL/HDL RATIO: 3
CHOLESTEROL: 128 mg/dL (ref 0–200)
HDL: 40 mg/dL (ref >39.00)
LDL CALC: 69 mg/dL (ref 0–99)
NonHDL: 87.66
TRIGLYCERIDES: 92 mg/dL (ref 0.0–149.0)
VLDL: 18.4 mg/dL (ref 0.0–40.0)

## 2018-05-25 LAB — HEMOGLOBIN A1C: Hgb A1c MFr Bld: 5.8 % (ref 4.6–6.5)

## 2018-05-29 ENCOUNTER — Ambulatory Visit (INDEPENDENT_AMBULATORY_CARE_PROVIDER_SITE_OTHER): Payer: Medicare HMO | Admitting: Family Medicine

## 2018-05-29 ENCOUNTER — Encounter: Payer: Self-pay | Admitting: Family Medicine

## 2018-05-29 VITALS — BP 126/64 | HR 74 | Temp 98.2°F | Resp 18 | Ht 68.0 in | Wt 235.0 lb

## 2018-05-29 DIAGNOSIS — E669 Obesity, unspecified: Secondary | ICD-10-CM

## 2018-05-29 DIAGNOSIS — L578 Other skin changes due to chronic exposure to nonionizing radiation: Secondary | ICD-10-CM | POA: Diagnosis not present

## 2018-05-29 DIAGNOSIS — E785 Hyperlipidemia, unspecified: Secondary | ICD-10-CM | POA: Diagnosis not present

## 2018-05-29 DIAGNOSIS — G4733 Obstructive sleep apnea (adult) (pediatric): Secondary | ICD-10-CM | POA: Diagnosis not present

## 2018-05-29 DIAGNOSIS — I10 Essential (primary) hypertension: Secondary | ICD-10-CM

## 2018-05-29 DIAGNOSIS — R739 Hyperglycemia, unspecified: Secondary | ICD-10-CM | POA: Diagnosis not present

## 2018-05-29 DIAGNOSIS — Z0000 Encounter for general adult medical examination without abnormal findings: Secondary | ICD-10-CM | POA: Diagnosis not present

## 2018-05-29 NOTE — Patient Instructions (Addendum)
Clotrimazole or Lotrimin are good options for rash on left ankle  Shingrix is the new shingles shot 2 shots at 2-6 months at pharmacy  Preventive Care 74 Years and Older, Male Preventive care refers to lifestyle choices and visits with your health care provider that can promote health and wellness. What does preventive care include?   A yearly physical exam. This is also called an annual well check.  Dental exams once or twice a year.  Routine eye exams. Ask your health care provider how often you should have your eyes checked.  Personal lifestyle choices, including: ? Daily care of your teeth and gums. ? Regular physical activity. ? Eating a healthy diet. ? Avoiding tobacco and drug use. ? Limiting alcohol use. ? Practicing safe sex. ? Taking low doses of aspirin every day. ? Taking vitamin and mineral supplements as recommended by your health care provider. What happens during an annual well check? The services and screenings done by your health care provider during your annual well check will depend on your age, overall health, lifestyle risk factors, and family history of disease. Counseling Your health care provider may ask you questions about your:  Alcohol use.  Tobacco use.  Drug use.  Emotional well-being.  Home and relationship well-being.  Sexual activity.  Eating habits.  History of falls.  Memory and ability to understand (cognition).  Work and work Statistician. Screening You may have the following tests or measurements:  Height, weight, and BMI.  Blood pressure.  Lipid and cholesterol levels. These may be checked every 5 years, or more frequently if you are over 74 years old.  Skin check.  Lung cancer screening. You may have this screening every year starting at age 74 if you have a 30-pack-year history of smoking and currently smoke or have quit within the past 15 years.  Colorectal cancer screening. All adults should have this screening  starting at age 74 and continuing until age 74. You will have tests every 1-10 years, depending on your results and the type of screening test. People at increased risk should start screening at an earlier age. Screening tests may include: ? Guaiac-based fecal occult blood testing. ? Fecal immunochemical test (FIT). ? Stool DNA test. ? Virtual colonoscopy. ? Sigmoidoscopy. During this test, a flexible tube with a tiny camera (sigmoidoscope) is used to examine your rectum and lower colon. The sigmoidoscope is inserted through your anus into your rectum and lower colon. ? Colonoscopy. During this test, a long, thin, flexible tube with a tiny camera (colonoscope) is used to examine your entire colon and rectum.  Prostate cancer screening. Recommendations will vary depending on your family history and other risks.  Hepatitis C blood test.  Hepatitis B blood test.  Sexually transmitted disease (STD) testing.  Diabetes screening. This is done by checking your blood sugar (glucose) after you have not eaten for a while (fasting). You may have this done every 1-3 years.  Abdominal aortic aneurysm (AAA) screening. You may need this if you are a current or former smoker.  Osteoporosis. You may be screened starting at age 74 if you are at high risk. Talk with your health care provider about your test results, treatment options, and if necessary, the need for more tests. Vaccines Your health care provider may recommend certain vaccines, such as:  Influenza vaccine. This is recommended every year.  Tetanus, diphtheria, and acellular pertussis (Tdap, Td) vaccine. You may need a Td booster every 10 years.  Varicella vaccine. You  may need this if you have not been vaccinated.  Zoster vaccine. You may need this after age 74.  Measles, mumps, and rubella (MMR) vaccine. You may need at least one dose of MMR if you were born in 1957 or later. You may also need a second dose.  Pneumococcal 13-valent  conjugate (PCV13) vaccine. One dose is recommended after age 74.  Pneumococcal polysaccharide (PPSV23) vaccine. One dose is recommended after age 74.  Meningococcal vaccine. You may need this if you have certain conditions.  Hepatitis A vaccine. You may need this if you have certain conditions or if you travel or work in places where you may be exposed to hepatitis A.  Hepatitis B vaccine. You may need this if you have certain conditions or if you travel or work in places where you may be exposed to hepatitis B.  Haemophilus influenzae type b (Hib) vaccine. You may need this if you have certain risk factors. Talk to your health care provider about which screenings and vaccines you need and how often you need them. This information is not intended to replace advice given to you by your health care provider. Make sure you discuss any questions you have with your health care provider. Document Released: 05/26/2015 Document Revised: 06/19/2017 Document Reviewed: 02/28/2015 Elsevier Interactive Patient Education  2019 [employer].

## 2018-05-29 NOTE — Progress Notes (Signed)
Subjective:    Patient ID: Christian [name], male    DOB: May 14, 1945, 73 y.o.   MRN: [id number]  No chief complaint on file.   HPI Patient is in today for annual preventative exam and follow up on chronic medical concerns including hypertension, hyperlipidemia and obesity. He feels well today. No recent febrile illness or hospitalizations. No polyuria or polydipsia. He tries to stay active and to maintain a heart healthy diet. He manages his activities of daily living. Denies CP/palp/SOB/HA/congestion/fevers/GI or GU c/o. Taking meds as prescribed  Past Medical History:  Diagnosis Date  . Back pain 06/04/2015  . DVT of lower limb, acute ([place]) 01/13/2015  . High blood pressure   . High cholesterol   . History of chicken pox 01/13/2015  . Hyperlipidemia, mild 01/13/2015  . Hypokalemia 01/13/2015  . Obesity   . Verrucous skin lesion 02/08/2016    Past Surgical History:  Procedure Laterality Date  . EYE SURGERY Bilateral 02/2019   Dr Talbert Forest    Family History  Problem Relation Age of Onset  . Stroke Father        from surgery  . Emphysema Father        smoker  . Heart disease Father        carotid artery disease, vasculopathy  . Allergic Disorder Sister     Social History   Socioeconomic History  . Marital status: Married    Spouse name: Not on file  . Number of children: 1  . Years of education: Not on file  . Highest education level: Not on file  Occupational History  . Occupation: Retired  Scientific laboratory technician  . Financial resource strain: Not on file  . Food insecurity:    Worry: Not on file    Inability: Not on file  . Transportation needs:    Medical: Not on file    Non-medical: Not on file  Tobacco Use  . Smoking status: Never Smoker  . Smokeless tobacco: Never Used  Substance and Sexual Activity  . Alcohol use: Yes    Alcohol/week: 0.0 standard drinks    Comment: 2-3 glasses of wine every night  . Drug use: No  . Sexual activity: Yes    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,  retired from Web designer, no dietary restrictions  Lifestyle  . Physical activity:    Days per week: Not on file    Minutes per session: Not on file  . Stress: Not on file  Relationships  . Social connections:    Talks on phone: Not on file    Gets together: Not on file    Attends religious service: Not on file    Active member of club or organization: Not on file    Attends meetings of clubs or organizations: Not on file    Relationship status: Not on file  . Intimate partner violence:    Fear of current or ex partner: Not on file    Emotionally abused: Not on file    Physically abused: Not on file    Forced sexual activity: Not on file  Other Topics Concern  . Not on file  Social History Narrative  . Not on file    Outpatient Medications Prior to Visit  Medication Sig Dispense Refill  . aspirin 81 MG tablet Take 81 mg by mouth daily. Reported on 05/06/2015    . hydrochlorothiazide (HYDRODIURIL) 25 MG tablet TAKE 1 TABLET BY MOUTH EVERY DAY 90 tablet 1  . losartan (COZAAR)  100 MG tablet TAKE 1 TABLET BY MOUTH EVERY DAY 90 tablet 1  . Multiple Vitamin (MULTIVITAMIN) tablet Take 1 tablet by mouth daily.    . pravastatin (PRAVACHOL) 40 MG tablet TAKE 1 TABLET BY MOUTH EVERY DAY 90 tablet 2  . triamcinolone cream (KENALOG) 0.1 % Apply 1 application topically 2 (two) times daily. 30 g 0   No facility-administered medications prior to visit.     Allergies  Allergen Reactions  . Codeine Anaphylaxis    Review of Systems  Constitutional: Negative for chills, fever and malaise/fatigue.  HENT: Negative for congestion and hearing loss.   Eyes: Negative for discharge.  Respiratory: Negative for cough, sputum production and shortness of breath.   Cardiovascular: Negative for chest pain, palpitations and leg swelling.  Gastrointestinal: Negative for abdominal pain, blood in stool, constipation, diarrhea, heartburn, nausea and vomiting.  Genitourinary: Negative for  dysuria, frequency, hematuria and urgency.  Musculoskeletal: Negative for back pain, falls and myalgias.  Skin: Negative for rash.  Neurological: Negative for dizziness, sensory change, loss of consciousness, weakness and headaches.  Endo/Heme/Allergies: Negative for environmental allergies. Does not bruise/bleed easily.  Psychiatric/Behavioral: Negative for depression and suicidal ideas. The patient is not nervous/anxious and does not have insomnia.        Objective:    Physical Exam Vitals signs and nursing note reviewed.  Constitutional:      General: He is not in acute distress.    Appearance: He is well-developed. He is obese.  HENT:     Head: Normocephalic and atraumatic.     Right Ear: Tympanic membrane normal.     Left Ear: Tympanic membrane normal.     Nose: Nose normal. No congestion or rhinorrhea.     Mouth/Throat:     Mouth: Mucous membranes are moist.  Eyes:     General:        Right eye: No discharge.        Left eye: No discharge.     Extraocular Movements: Extraocular movements intact.     Pupils: Pupils are equal, round, and reactive to light.  Neck:     Musculoskeletal: Normal range of motion and neck supple.  Cardiovascular:     Rate and Rhythm: Normal rate and regular rhythm.     Pulses: Normal pulses.     Heart sounds: Normal heart sounds. No murmur.  Pulmonary:     Effort: Pulmonary effort is normal.     Breath sounds: Normal breath sounds.  Abdominal:     General: Bowel sounds are normal.     Palpations: Abdomen is soft.     Tenderness: There is no abdominal tenderness.  Skin:    General: Skin is warm and dry.  Neurological:     General: No focal deficit present.     Mental Status: He is alert and oriented to person, place, and time.     Cranial Nerves: No cranial nerve deficit.  Psychiatric:        Mood and Affect: Mood normal.        Thought Content: Thought content normal.     BP 126/64 (BP Location: Left Arm, Patient Position: Sitting,  Cuff Size: Normal)   Pulse 74   Temp 98.2 F (36.8 C) (Oral)   Resp 18   Ht _0  (1.727 m)   Wt 235 lb (106.6 kg)   SpO2 96%   BMI 35.73 kg/m  Wt Readings from Last 3 Encounters:  05/29/18 235 lb (106.6 kg)  11/20/17 234  lb 6.4 oz (106.3 kg)  05/20/17 240 lb (108.9 kg)     Lab Results  Component Value Date   WBC 5.1 05/25/2018   HGB 13.9 05/25/2018   HCT 41.4 05/25/2018   PLT 219.0 05/25/2018   GLUCOSE 109 (H) 05/25/2018   CHOL 128 05/25/2018   TRIG 92.0 05/25/2018   HDL 40.00 05/25/2018   LDLCALC 69 05/25/2018   ALT 28 05/25/2018   AST 23 05/25/2018   NA 137 05/25/2018   K 3.5 05/25/2018   CL 102 05/25/2018   CREATININE 0.95 05/25/2018   BUN 11 05/25/2018   CO2 28 05/25/2018   TSH 2.84 05/25/2018   PSA 1.59 05/20/2017   HGBA1C 5.8 05/25/2018    Lab Results  Component Value Date   TSH 2.84 05/25/2018   Lab Results  Component Value Date   WBC 5.1 05/25/2018   HGB 13.9 05/25/2018   HCT 41.4 05/25/2018   MCV 96.1 05/25/2018   PLT 219.0 05/25/2018   Lab Results  Component Value Date   NA 137 05/25/2018   K 3.5 05/25/2018   CO2 28 05/25/2018   GLUCOSE 109 (H) 05/25/2018   BUN 11 05/25/2018   CREATININE 0.95 05/25/2018   BILITOT 0.6 05/25/2018   ALKPHOS 57 05/25/2018   AST 23 05/25/2018   ALT 28 05/25/2018   PROT 6.9 05/25/2018   ALBUMIN 4.0 05/25/2018   CALCIUM 9.2 05/25/2018   ANIONGAP 10 12/21/2014   GFR 82.51 05/25/2018   Lab Results  Component Value Date   CHOL 128 05/25/2018   Lab Results  Component Value Date   HDL 40.00 05/25/2018   Lab Results  Component Value Date   LDLCALC 69 05/25/2018   Lab Results  Component Value Date   TRIG 92.0 05/25/2018   Lab Results  Component Value Date   CHOLHDL 3 05/25/2018   Lab Results  Component Value Date   HGBA1C 5.8 05/25/2018       Assessment & Plan:   Problem List Items Addressed This Visit    Obstructive sleep apnea    Using CPAP      Hyperlipidemia, mild    Tolerating  statin, encouraged heart healthy diet, avoid trans fats, minimize simple carbs and saturated fats. Increase exercise as tolerated      Relevant Orders   Lipid panel   Obesity    Encouraged DASH diet, decrease po intake and increase exercise as tolerated. Needs 7-8 hours of sleep nightly. Avoid trans fats, eat small, frequent meals every 4-5 hours with lean proteins, complex carbs and healthy fats. Minimize simple carbs he has recently cut out added sugars and ETOH.       Relevant Orders   TSH   Sun-damaged skin    Sees dermatology next week. He has a new lesion on his left ankle, has been growing for a couple of months. Possibly a Tinea lesion he is advised to try Clotrimazole or Terbinafine bid til seen by dermatology      Benign essential HTN    Well controlled, no changes to meds. Encouraged heart healthy diet such as the DASH diet and exercise as tolerated.       Relevant Orders   CBC   Comprehensive metabolic panel   TSH   Hyperglycemia    hgba1c acceptable, minimize simple carbs. Increase exercise as tolerated.       Relevant Orders   TSH   Hemoglobin A1c   Preventative health care    Patient encouraged to maintain  heart healthy diet, regular exercise, adequate sleep. Consider daily probiotics. Take medications as prescribed. Labs reviewed. ACP documents UTD         I am having Christian A. Strehle "Tom" maintain his aspirin, multivitamin, triamcinolone cream, pravastatin, losartan, and hydrochlorothiazide.  No orders of the defined types were placed in this encounter.    Penni Homans, MD

## 2018-05-29 NOTE — Assessment & Plan Note (Addendum)
Encouraged DASH diet, decrease po intake and increase exercise as tolerated. Needs 7-8 hours of sleep nightly. Avoid trans fats, eat small, frequent meals every 4-5 hours with lean proteins, complex carbs and healthy fats. Minimize simple carbs he has recently cut out added sugars and ETOH.

## 2018-05-29 NOTE — Assessment & Plan Note (Signed)
hgba1c acceptable, minimize simple carbs. Increase exercise as tolerated.  

## 2018-05-29 NOTE — Assessment & Plan Note (Signed)
Tolerating statin, encouraged heart healthy diet, avoid trans fats, minimize simple carbs and saturated fats. Increase exercise as tolerated 

## 2018-05-29 NOTE — Assessment & Plan Note (Signed)
Well controlled, no changes to meds. Encouraged heart healthy diet such as the DASH diet and exercise as tolerated.

## 2018-05-29 NOTE — Assessment & Plan Note (Signed)
Sees dermatology next week. He has a new lesion on his left ankle, has been growing for a couple of months. Possibly a Tinea lesion he is advised to try Clotrimazole or Terbinafine bid til seen by dermatology

## 2018-06-01 NOTE — Assessment & Plan Note (Signed)
Using CPAP

## 2018-06-01 NOTE — Assessment & Plan Note (Signed)
Patient encouraged to maintain heart healthy diet, regular exercise, adequate sleep. Consider daily probiotics. Take medications as prescribed. Labs reviewed. ACP documents UTD

## 2018-06-04 DIAGNOSIS — L570 Actinic keratosis: Secondary | ICD-10-CM | POA: Diagnosis not present

## 2018-06-04 DIAGNOSIS — D1801 Hemangioma of skin and subcutaneous tissue: Secondary | ICD-10-CM | POA: Diagnosis not present

## 2018-06-04 DIAGNOSIS — L920 Granuloma annulare: Secondary | ICD-10-CM | POA: Diagnosis not present

## 2018-07-03 ENCOUNTER — Other Ambulatory Visit: Payer: Self-pay | Admitting: Family Medicine

## 2018-08-08 DIAGNOSIS — G4733 Obstructive sleep apnea (adult) (pediatric): Secondary | ICD-10-CM | POA: Diagnosis not present

## 2018-08-24 ENCOUNTER — Other Ambulatory Visit: Payer: Self-pay | Admitting: Family Medicine

## 2018-11-18 DIAGNOSIS — G4733 Obstructive sleep apnea (adult) (pediatric): Secondary | ICD-10-CM | POA: Diagnosis not present

## 2018-11-26 ENCOUNTER — Other Ambulatory Visit (INDEPENDENT_AMBULATORY_CARE_PROVIDER_SITE_OTHER): Payer: Medicare HMO

## 2018-11-26 ENCOUNTER — Other Ambulatory Visit: Payer: Self-pay

## 2018-11-26 DIAGNOSIS — R739 Hyperglycemia, unspecified: Secondary | ICD-10-CM

## 2018-11-26 DIAGNOSIS — I10 Essential (primary) hypertension: Secondary | ICD-10-CM

## 2018-11-26 DIAGNOSIS — E669 Obesity, unspecified: Secondary | ICD-10-CM

## 2018-11-26 DIAGNOSIS — E785 Hyperlipidemia, unspecified: Secondary | ICD-10-CM | POA: Diagnosis not present

## 2018-11-26 LAB — LIPID PANEL
Cholesterol: 164 mg/dL (ref 0–200)
HDL: 49.1 mg/dL (ref >39.00)
LDL Cholesterol: 95 mg/dL (ref 0–99)
NonHDL: 115.17
Total CHOL/HDL Ratio: 3
Triglycerides: 100 mg/dL (ref 0.0–149.0)
VLDL: 20 mg/dL (ref 0.0–40.0)

## 2018-11-26 LAB — COMPREHENSIVE METABOLIC PANEL
ALT: 22 U/L (ref 0–53)
AST: 21 U/L (ref 0–37)
Albumin: 4.3 g/dL (ref 3.5–5.2)
Alkaline Phosphatase: 65 U/L (ref 39–117)
BUN: 12 mg/dL (ref 6–23)
CO2: 29 mEq/L (ref 19–32)
Calcium: 9.3 mg/dL (ref 8.4–10.5)
Chloride: 102 mEq/L (ref 96–112)
Creatinine, Ser: 0.79 mg/dL (ref 0.40–1.50)
GFR: 95.91 mL/min (ref >60.00)
Glucose, Bld: 93 mg/dL (ref 70–99)
Potassium: 3.9 mEq/L (ref 3.5–5.1)
Sodium: 140 mEq/L (ref 135–145)
Total Bilirubin: 0.6 mg/dL (ref 0.2–1.2)
Total Protein: 7.2 g/dL (ref 6.0–8.3)

## 2018-11-26 LAB — CBC
HCT: 42.2 % (ref 39.0–52.0)
Hemoglobin: 14 g/dL (ref 13.0–17.0)
MCHC: 33.1 g/dL (ref 30.0–36.0)
MCV: 94.2 fl (ref 78.0–100.0)
Platelets: 204 10*3/uL (ref 150.0–400.0)
RBC: 4.48 Mil/uL (ref 4.22–5.81)
RDW: 15 % (ref 11.5–15.5)
WBC: 5.8 10*3/uL (ref 4.0–10.5)

## 2018-11-26 LAB — HEMOGLOBIN A1C: Hgb A1c MFr Bld: 5.8 % (ref 4.6–6.5)

## 2018-11-26 LAB — TSH: TSH: 3.4 u[IU]/mL (ref 0.35–4.50)

## 2018-12-03 ENCOUNTER — Encounter: Payer: Self-pay | Admitting: Family Medicine

## 2018-12-03 ENCOUNTER — Other Ambulatory Visit: Payer: Self-pay

## 2018-12-03 ENCOUNTER — Ambulatory Visit (INDEPENDENT_AMBULATORY_CARE_PROVIDER_SITE_OTHER): Payer: Medicare HMO | Admitting: Family Medicine

## 2018-12-03 DIAGNOSIS — E785 Hyperlipidemia, unspecified: Secondary | ICD-10-CM | POA: Diagnosis not present

## 2018-12-03 DIAGNOSIS — R739 Hyperglycemia, unspecified: Secondary | ICD-10-CM

## 2018-12-03 DIAGNOSIS — E669 Obesity, unspecified: Secondary | ICD-10-CM | POA: Diagnosis not present

## 2018-12-03 DIAGNOSIS — I10 Essential (primary) hypertension: Secondary | ICD-10-CM | POA: Diagnosis not present

## 2018-12-03 NOTE — Progress Notes (Signed)
Virtual Visit via Phone Note  I connected with Christian [name] on 12/03/18 at  9:20 AM EDT by a phone enabled telemedicine application and verified that I am speaking with the correct person using two identifiers.  Location: Patient: home Provider: home   I discussed the limitations of evaluation and management by telemedicine and the availability of in person appointments. The patient expressed understanding and agreed to proceed. Magdalene Molly, CMA was able to get patient set up on visit, phone after being unable to get patient set up on video visit   Subjective:    Patient ID: Christian [name], male    DOB: 20-Sep-1945, 74 y.o.   MRN: [id number]  No chief complaint on file.   HPI Patient is in today for follow-up on chronic medical concerns including hyperlipidemia, hyperglycemia, hypertension and more.  He feels well today.  No recent febrile illness or hospitalization.  No complaints of polyuria or polydipsia.  He is maintaining quarantine well and masking when he has to go out.  He stays active and tries to maintain a heart healthy diet. Denies CP/palp/SOB/HA/congestion/fevers/GI or GU c/o. Taking meds as prescribed  Past Medical History:  Diagnosis Date  . Back pain 06/04/2015  . DVT of lower limb, acute ([place]) 01/13/2015  . High blood pressure   . High cholesterol   . History of chicken pox 01/13/2015  . Hyperlipidemia, mild 01/13/2015  . Hypokalemia 01/13/2015  . Obesity   . Verrucous skin lesion 02/08/2016    Past Surgical History:  Procedure Laterality Date  . EYE SURGERY Bilateral 02/2019   Dr Talbert Forest    Family History  Problem Relation Age of Onset  . Stroke Father        from surgery  . Emphysema Father        smoker  . Heart disease Father        carotid artery disease, vasculopathy  . Allergic Disorder Sister     Social History   Socioeconomic History  . Marital status: Married    Spouse name: Not on file  . Number of children: 1  . Years of education: Not on  file  . Highest education level: Not on file  Occupational History  . Occupation: Retired  Scientific laboratory technician  . Financial resource strain: Not on file  . Food insecurity    Worry: Not on file    Inability: Not on file  . Transportation needs    Medical: Not on file    Non-medical: Not on file  Tobacco Use  . Smoking status: Never Smoker  . Smokeless tobacco: Never Used  Substance and Sexual Activity  . Alcohol use: Yes    Alcohol/week: 0.0 standard drinks    Comment: 2-3 glasses of wine every night  . Drug use: No  . 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 Physical activity    Days per week: Not on file    Minutes per session: Not on file  . Stress: Not on file  Relationships  . Social Herbalist on phone: Not on file    Gets together: Not on file    Attends religious service: Not on file    Active member of club or organization: Not on file    Attends meetings of clubs or organizations: Not on file    Relationship status: Not on file  . Intimate partner violence  Fear of current or ex partner: Not on file    Emotionally abused: Not on file    Physically abused: Not on file    Forced sexual activity: Not on file  Other Topics Concern  . Not on file  Social History Narrative  . Not on file    Outpatient Medications Prior to Visit  Medication Sig Dispense Refill  . aspirin 81 MG tablet Take 81 mg by mouth daily. Reported on 05/06/2015    . hydrochlorothiazide (HYDRODIURIL) 25 MG tablet TAKE 1 TABLET BY MOUTH EVERY DAY 90 tablet 1  . losartan (COZAAR) 100 MG tablet TAKE 1 TABLET BY MOUTH EVERY DAY 90 tablet 1  . Multiple Vitamin (MULTIVITAMIN) tablet Take 1 tablet by mouth daily.    . pravastatin (PRAVACHOL) 40 MG tablet TAKE 1 TABLET BY MOUTH EVERY DAY 90 tablet 2  . triamcinolone cream (KENALOG) 0.1 % Apply 1 application topically 2 (two) times daily. 30 g 0   No  facility-administered medications prior to visit.     Allergies  Allergen Reactions  . Codeine Anaphylaxis    Review of Systems  Constitutional: Negative for fever and malaise/fatigue.  HENT: Negative for congestion.   Eyes: Negative for blurred vision.  Respiratory: Negative for shortness of breath.   Cardiovascular: Negative for chest pain, palpitations and leg swelling.  Gastrointestinal: Negative for abdominal pain, blood in stool and nausea.  Genitourinary: Negative for dysuria and frequency.  Musculoskeletal: Negative for falls.  Skin: Negative for rash.  Neurological: Negative for dizziness, loss of consciousness and headaches.  Endo/Heme/Allergies: Negative for environmental allergies.  Psychiatric/Behavioral: Negative for depression. The patient is not nervous/anxious.        Objective:    Physical Exam unable to obtain via phone  Wt 233 lb (105.7 kg)   BMI 35.43 kg/m  Wt Readings from Last 3 Encounters:  12/03/18 233 lb (105.7 kg)  05/29/18 235 lb (106.6 kg)  11/20/17 234 lb 6.4 oz (106.3 kg)    Diabetic Foot Exam - Simple   No data filed     Lab Results  Component Value Date   WBC 5.8 11/26/2018   HGB 14.0 11/26/2018   HCT 42.2 11/26/2018   PLT 204.0 11/26/2018   GLUCOSE 93 11/26/2018   CHOL 164 11/26/2018   TRIG 100.0 11/26/2018   HDL 49.10 11/26/2018   LDLCALC 95 11/26/2018   ALT 22 11/26/2018   AST 21 11/26/2018   NA 140 11/26/2018   K 3.9 11/26/2018   CL 102 11/26/2018   CREATININE 0.79 11/26/2018   BUN 12 11/26/2018   CO2 29 11/26/2018   TSH 3.40 11/26/2018   PSA 1.59 05/20/2017   HGBA1C 5.8 11/26/2018    Lab Results  Component Value Date   TSH 3.40 11/26/2018   Lab Results  Component Value Date   WBC 5.8 11/26/2018   HGB 14.0 11/26/2018   HCT 42.2 11/26/2018   MCV 94.2 11/26/2018   PLT 204.0 11/26/2018   Lab Results  Component Value Date   NA 140 11/26/2018   K 3.9 11/26/2018   CO2 29 11/26/2018   GLUCOSE 93 11/26/2018    BUN 12 11/26/2018   CREATININE 0.79 11/26/2018   BILITOT 0.6 11/26/2018   ALKPHOS 65 11/26/2018   AST 21 11/26/2018   ALT 22 11/26/2018   PROT 7.2 11/26/2018   ALBUMIN 4.3 11/26/2018   CALCIUM 9.3 11/26/2018   ANIONGAP 10 12/21/2014   GFR 95.91 11/26/2018   Lab Results  Component Value  Date   CHOL 164 11/26/2018   Lab Results  Component Value Date   HDL 49.10 11/26/2018   Lab Results  Component Value Date   LDLCALC 95 11/26/2018   Lab Results  Component Value Date   TRIG 100.0 11/26/2018   Lab Results  Component Value Date   CHOLHDL 3 11/26/2018   Lab Results  Component Value Date   HGBA1C 5.8 11/26/2018       Assessment & Plan:   Problem List Items Addressed This Visit    Hyperlipidemia, mild    Tolerating statin, encouraged heart healthy diet, avoid trans fats, minimize simple carbs and saturated fats. Increase exercise as tolerated      Obesity    Encouraged DASH diet, decrease po intake and increase exercise as tolerated. Needs 7-8 hours of sleep nightly. Avoid trans fats, eat small, frequent meals every 4-5 hours with lean proteins, complex carbs and healthy fats. Minimize simple carbs      Benign essential HTN    no changes to meds. Encouraged heart healthy diet such as the DASH diet and exercise as tolerated.       Hyperglycemia    hgba1c acceptable, minimize simple carbs. Increase exercise as tolerated         I am having Christian A. Shanafelt "Tom" maintain his aspirin, multivitamin, triamcinolone cream, pravastatin, losartan, and hydrochlorothiazide.  No orders of the defined types were placed in this encounter.    I discussed the assessment and treatment plan with the patient. The patient was provided an opportunity to ask questions and all were answered. The patient agreed with the plan and demonstrated an understanding of the instructions.   The patient was advised to call back or seek an in-person evaluation if the symptoms worsen or if  the condition fails to improve as anticipated.  I provided 25 minutes of non-face-to-face time during this encounter.   Penni Homans, MD

## 2018-12-03 NOTE — Assessment & Plan Note (Signed)
Encouraged DASH diet, decrease po intake and increase exercise as tolerated. Needs 7-8 hours of sleep nightly. Avoid trans fats, eat small, frequent meals every 4-5 hours with lean proteins, complex carbs and healthy fats. Minimize simple carbs 

## 2018-12-03 NOTE — Assessment & Plan Note (Signed)
hgba1c acceptable, minimize simple carbs. Increase exercise as tolerated.  

## 2018-12-03 NOTE — Assessment & Plan Note (Signed)
no changes to meds. Encouraged heart healthy diet such as the DASH diet and exercise as tolerated.

## 2018-12-03 NOTE — Assessment & Plan Note (Signed)
Tolerating statin, encouraged heart healthy diet, avoid trans fats, minimize simple carbs and saturated fats. Increase exercise as tolerated 

## 2019-01-19 DIAGNOSIS — R69 Illness, unspecified: Secondary | ICD-10-CM | POA: Diagnosis not present

## 2019-03-02 ENCOUNTER — Other Ambulatory Visit: Payer: Self-pay

## 2019-03-02 ENCOUNTER — Encounter: Payer: Self-pay | Admitting: Pulmonary Disease

## 2019-03-02 ENCOUNTER — Ambulatory Visit (INDEPENDENT_AMBULATORY_CARE_PROVIDER_SITE_OTHER): Payer: Medicare HMO | Admitting: Pulmonary Disease

## 2019-03-02 DIAGNOSIS — G4733 Obstructive sleep apnea (adult) (pediatric): Secondary | ICD-10-CM | POA: Diagnosis not present

## 2019-03-02 NOTE — Progress Notes (Signed)
Virtual Visit via Telephone Note  I connected with Christian [name] on 03/02/19 at  9:30 AM EDT by telephone and verified that I am speaking with the correct person using two identifiers.  Location: Patient: Home Provider: Office [facility] - [address], [place], [place], [place] [address]   I discussed the limitations, risks, security and privacy concerns of performing an evaluation and management service by telephone and the availability of in person appointments. I also discussed with the patient that there may be a patient responsible charge related to this service. The patient expressed understanding and agreed to proceed.  Patient consented to consult via telephone: Yes People present and their role in pt care: Pt    History of Present Illness:  73 year old male never smoker followed in our office for severe obstructive sleep apnea  Past medical history: History of DVT, obesity, hypertension, diverticulosis Smoking history: Never smoker Maintenance: None Patient of Dr. Annamaria Boots  Chief complaint: Follow-up for obstructive sleep apnea   73 year old male never smoker followed in our office for severe obstructive sleep apnea.  Patient is followed in our office by Dr. Annamaria Boots.  Patient completing televisit with her office today hoping to receive a new CPAP.  CPAP compliance report shows excellent compliance.  See compliance report listed below:  01/31/2019-03/01/2019-CPAP compliance report-30 had a last 30 days use, all 30 those days greater than 4 hours, average usage 10 hours and 9 minutes, CPAP set pressure of 12, AHI 1.4  Patient currently has no complaints with using his CPAP.  He last received his previous CPAP over 5 years ago.  His DME company still [employer].  His last seen in our office in 2017.  CPAP compliance shows excellent compliance.   Observations/Objective:  03/29/2000-split-night sleep study-AHI 69.6, CPAP set pressure of 11 shown to have best  control  06/29/2003-echocardiogram-LV ejection fraction 55 to 65%, right ventricle was grossly normal  Assessment and Plan:  Obstructive sleep apnea Plan:  Continue CPAP  Follow-up with our office in a year   Follow Up Instructions:  Return in about 1 year (around 03/01/2020), or if symptoms worsen or fail to improve, for Follow up with Dr. Annamaria Boots.   I discussed the assessment and treatment plan with the patient. The patient was provided an opportunity to ask questions and all were answered. The patient agreed with the plan and demonstrated an understanding of the instructions.   The patient was advised to call back or seek an in-person evaluation if the symptoms worsen or if the condition fails to improve as anticipated.  I provided 16 minutes of non-face-to-face time during this encounter.   Lauraine Rinne, NP

## 2019-03-02 NOTE — Patient Instructions (Addendum)
You were seen today by Christian Rinne, NP  for:   1. Obstructive sleep apnea  New CPAP order Continue set pressure of 12, same settings Supplies for a year DME: [employer]  We recommend that you continue using your CPAP daily >>>Keep up the hard work using your device >>> Goal should be wearing this for the entire night that you are sleeping, at least 4 to 6 hours  Remember:   Do not drive or operate heavy machinery if tired or drowsy.   Please notify the supply company and office if you are unable to use your device regularly due to missing supplies or machine being broken.   Work on maintaining a healthy weight and following your recommended nutrition plan   Maintain proper daily exercise and movement   Maintaining proper use of your device can also help improve management of other chronic illnesses such as: Blood pressure, blood sugars, and weight management.   BiPAP/ CPAP Cleaning:  >>>Clean weekly, with Dawn soap, and bottle brush.  Set up to air dry.     Follow Up:    Return in about 1 year (around 03/01/2020), or if symptoms worsen or fail to improve, for Follow up with Dr. Annamaria Boots.   Please do your part to reduce the spread of COVID-19:      Reduce your risk of any infection  and COVID19 by using the similar precautions used for avoiding the common cold or flu:   Wash your hands often with soap and warm water for at least 20 seconds.  If soap and water are not readily available, use an alcohol-based hand sanitizer with at least 60% alcohol.   If coughing or sneezing, cover your mouth and nose by coughing or sneezing into the elbow areas of your shirt or coat, into a tissue or into your sleeve (not your hands).  WEAR A MASK when in public   Avoid shaking hands with others and consider head nods or verbal greetings only.  Avoid touching your eyes, nose, or mouth with unwashed hands.   Avoid close contact with people who are sick.  Avoid places or events with large  numbers of people in one location, like concerts or sporting events.  If you have some symptoms but not all symptoms, continue to monitor at home and seek medical attention if your symptoms worsen.  If you are having a medical emergency, call 911.   [place] / e-Visit: [email or web address]         [facility]: [facility]: [phone]                   [facility]: [phone]     It is flu season:   >>> Best ways to protect herself from the flu: Receive the yearly flu vaccine, practice good hand hygiene washing with soap and also using hand sanitizer when available, eat a nutritious meals, get adequate rest, hydrate appropriately   Please contact the office if your symptoms worsen or you have concerns that you are not improving.   Thank you for choosing [facility] for your healthcare, and for allowing [name] to partner with you on your healthcare journey. I am thankful to be able to provide care to you today.   Wyn Quaker FNP-C     Living With Sleep Apnea Sleep apnea is a condition in which breathing pauses or becomes shallow during sleep. Sleep  apnea is most commonly caused by a collapsed or blocked airway. People with sleep apnea snore loudly and have times when they gasp and stop breathing for 10 seconds or more during sleep. This happens over and over during the night. This disrupts your sleep and keeps your body from getting the rest that it needs, which can cause tiredness and lack of energy (fatigue) during the day. The breaks in breathing also interrupt the deep sleep that you need to feel rested. Even if you do not completely wake up from the gaps in breathing, your sleep may not be restful. You may also have a headache in the morning and low energy during the day, and you may feel anxious or depressed. How can  sleep apnea affect me? Sleep apnea increases your chances of extreme tiredness during the day (daytime fatigue). It can also increase your risk for health conditions, such as:  Heart attack.  Stroke.  Diabetes.  Heart failure.  Irregular heartbeat.  High blood pressure. If you have daytime fatigue as a result of sleep apnea, you may be more likely to:  Perform poorly at school or work.  Fall asleep while driving.  Have difficulty with attention.  Develop depression or anxiety.  Become severely overweight (obese).  Have sexual dysfunction. What actions can I take to manage sleep apnea? Sleep apnea treatment   If you were given a device to open your airway while you sleep, use it only as told by your health care provider. You may be given: ? An oral appliance. This is a custom-made mouthpiece that shifts your lower jaw forward. ? A continuous positive airway pressure (CPAP) device. This device blows air through a mask when you breathe out (exhale). ? A nasal expiratory positive airway pressure (EPAP) device. This device has valves that you put into each nostril. ? A bi-level positive airway pressure (BPAP) device. This device blows air through a mask when you breathe in (inhale) and breathe out (exhale).  You may need surgery if other treatments do not work for you. Sleep habits  Go to sleep and wake up at the same time every day. This helps set your internal clock (circadian rhythm) for sleeping. ? If you stay up later than usual, such as on weekends, try to get up in the morning within 2 hours of your normal wake time.  Try to get at least 7-9 hours of sleep each night.  Stop computer, tablet, and mobile phone use a few hours before bedtime.  Do not take long naps during the day. If you nap, limit it to 30 minutes.  Have a relaxing bedtime routine. Reading or listening to music may relax you and help you sleep.  Use your bedroom only for sleep. ? Keep your  television and computer out of your bedroom. ? Keep your bedroom cool, dark, and quiet. ? Use a supportive mattress and pillows.  Follow your health care provider's instructions for other changes to sleep habits. Nutrition  Do not eat heavy meals in the evening.  Do not have caffeine in the later part of the day. The effects of caffeine can last for more than 5 hours.  Follow your health care provider's or dietitian's instructions for any diet changes. Lifestyle      Do not drink alcohol before bedtime. Alcohol can cause you to fall asleep at first, but then it can cause you to wake up in the middle of the night and have trouble getting back to sleep.  Do not use any products that contain nicotine or tobacco, such as cigarettes and e-cigarettes. If you need help quitting, ask your health care provider. Medicines  Take over-the-counter and prescription medicines only as told by your health care provider.  Do not use over-the-counter sleep medicine. You can become dependent on this medicine, and it can make sleep apnea worse.  Do not use medicines, such as sedatives and narcotics, unless told by your health care provider. Activity  Exercise on most days, but avoid exercising in the evening. Exercising near bedtime can interfere with sleeping.  If possible, spend time outside every day. Natural light helps regulate your circadian rhythm. General information  Lose weight if you need to, and maintain a healthy weight.  Keep all follow-up visits as told by your health care provider. This is important.  If you are having surgery, make sure to tell your health care provider that you have sleep apnea. You may need to bring your device with you. Where to find more information Learn more about sleep apnea and daytime fatigue from:  American Sleep Association: sleepassociation.[place]: sleepfoundation.org  National Heart, Lung, and Blood Institute:  https://www.[place]man-hill.biz/ Summary  Sleep apnea can cause daytime fatigue and other serious health conditions.  Both sleep apnea and daytime fatigue can be bad for your health and well-being.  You may need to wear a device while sleeping to help keep your airway open.  If you are having surgery, make sure to tell your health care provider that you have sleep apnea. You may need to bring your device with you.  Making changes to sleep habits, diet, lifestyle, and activity can help you manage sleep apnea. This information is not intended to replace advice given to you by your health care provider. Make sure you discuss any questions you have with your health care provider. Document Released: 07/24/2017 Document Revised: 08/21/2018 Document Reviewed: 07/24/2017 Elsevier Patient Education  [place].    CPAP and BPAP Information CPAP and BPAP are methods of helping a person breathe with the use of air pressure. CPAP stands for "continuous positive airway pressure." BPAP stands for "bi-level positive airway pressure." In both methods, air is blown through your nose or mouth and into your air passages to help you breathe well. CPAP and BPAP use different amounts of pressure to blow air. With CPAP, the amount of pressure stays the same while you breathe in and out. With BPAP, the amount of pressure is increased when you breathe in (inhale) so that you can take larger breaths. Your health care provider will recommend whether CPAP or BPAP would be more helpful for you. Why are CPAP and BPAP treatments used? CPAP or BPAP can be helpful if you have:  Sleep apnea.  Chronic obstructive pulmonary disease (COPD).  Heart failure.  Medical conditions that weaken the muscles of the chest including muscular dystrophy, or neurological diseases such as amyotrophic lateral sclerosis (ALS).  Other problems that cause breathing to be weak, abnormal, or difficult. CPAP is most commonly used for obstructive sleep  apnea (OSA) to keep the airways from collapsing when the muscles relax during sleep. How is CPAP or BPAP administered? Both CPAP and BPAP are provided by a small machine with a flexible plastic tube that attaches to a plastic mask. You wear the mask. Air is blown through the mask into your nose or mouth. The amount of pressure that is used to blow the air can be adjusted on the machine. Your  health care provider will determine the pressure setting that should be used based on your individual needs. When should CPAP or BPAP be used? In most cases, the mask only needs to be worn during sleep. Generally, the mask needs to be worn throughout the night and during any daytime naps. People with certain medical conditions may also need to wear the mask at other times when they are awake. Follow instructions from your health care provider about when to use the machine. What are some tips for using the mask?   Because the mask needs to be snug, some people feel trapped or closed-in (claustrophobic) when first using the mask. If you feel this way, you may need to get used to the mask. One way to do this is by holding the mask loosely over your nose or mouth and then gradually applying the mask more snugly. You can also gradually increase the amount of time that you use the mask.  Masks are available in various types and sizes. Some fit over your mouth and nose while others fit over just your nose. If your mask does not fit well, talk with your health care provider about getting a different one.  If you are using a mask that fits over your nose and you tend to breathe through your mouth, a chin strap may be applied to help keep your mouth closed.  The CPAP and BPAP machines have alarms that may sound if the mask comes off or develops a leak.  If you have trouble with the mask, it is very important that you talk with your health care provider about finding a way to make the mask easier to tolerate. Do not stop  using the mask. Stopping the use of the mask could have a negative impact on your health. What are some tips for using the machine?  Place your CPAP or BPAP machine on a secure table or stand near an electrical outlet.  Know where the on/off switch is located on the machine.  Follow instructions from your health care provider about how to set the pressure on your machine and when you should use it.  Do not eat or drink while the CPAP or BPAP machine is on. Food or fluids could get pushed into your lungs by the pressure of the CPAP or BPAP.  Do not smoke. Tobacco smoke residue can damage the machine.  For home use, CPAP and BPAP machines can be rented or purchased through home health care companies. Many different brands of machines are available. Renting a machine before purchasing may help you find out which particular machine works well for you.  Keep the CPAP or BPAP machine and attachments clean. Ask your health care provider for specific instructions. Get help right away if:  You have redness or open areas around your nose or mouth where the mask fits.  You have trouble using the CPAP or BPAP machine.  You cannot tolerate wearing the CPAP or BPAP mask.  You have pain, discomfort, and bloating in your abdomen. Summary  CPAP and BPAP are methods of helping a person breathe with the use of air pressure.  Both CPAP and BPAP are provided by a small machine with a flexible plastic tube that attaches to a plastic mask.  If you have trouble with the mask, it is very important that you talk with your health care provider about finding a way to make the mask easier to tolerate. This information is not intended to replace advice  given to you by your health care provider. Make sure you discuss any questions you have with your health care provider. Document Released: 01/26/2004 Document Revised: 08/19/2018 Document Reviewed: 03/18/2016 Elsevier Patient Education  2020 [employer].

## 2019-03-02 NOTE — Assessment & Plan Note (Addendum)
Plan:  Continue CPAP Order for new CPAP placed today Continue set pressure of 12 Follow-up with our office in a year

## 2019-03-02 NOTE — Addendum Note (Signed)
Addended by: Valerie Salts on: 03/02/2019 09:44 AM   Modules accepted: Orders

## 2019-03-03 ENCOUNTER — Other Ambulatory Visit: Payer: Self-pay | Admitting: Family Medicine

## 2019-03-08 DIAGNOSIS — G4733 Obstructive sleep apnea (adult) (pediatric): Secondary | ICD-10-CM | POA: Diagnosis not present

## 2019-04-06 ENCOUNTER — Other Ambulatory Visit: Payer: Self-pay

## 2019-04-07 ENCOUNTER — Ambulatory Visit (INDEPENDENT_AMBULATORY_CARE_PROVIDER_SITE_OTHER): Payer: Medicare HMO | Admitting: Family

## 2019-04-07 ENCOUNTER — Encounter: Payer: Self-pay | Admitting: Family

## 2019-04-07 VITALS — BP 142/80 | HR 70 | Temp 97.4°F | Resp 16 | Ht 69.0 in | Wt 238.0 lb

## 2019-04-07 DIAGNOSIS — L219 Seborrheic dermatitis, unspecified: Secondary | ICD-10-CM

## 2019-04-07 DIAGNOSIS — M1711 Unilateral primary osteoarthritis, right knee: Secondary | ICD-10-CM

## 2019-04-07 MED ORDER — MELOXICAM 7.5 MG PO TABS: 7.5000 mg | ORAL_TABLET | Freq: Every day | ORAL | 0 refills | Status: DC

## 2019-04-07 MED ORDER — KETOCONAZOLE 2 % EX CREA: 1.0000 "application " | TOPICAL_CREAM | CUTANEOUS | 0 refills | Status: DC

## 2019-04-07 NOTE — Progress Notes (Signed)
Subjective:    Patient ID: Christian [name], male    DOB: 07/21/1945, 73 y.o.   MRN: [id number]  HPI  Patient is a 73 yr old male who presents today with two concerns.  Knee pain- reports + soreness in his right knee x 6 weeks.  Reports that he walks 2-3 miles a day.  Previous right knee x-ray 2016 showed advanced degenerative changes. Pain is medial in the right knee.  Hurts more with climbing steps.  Has tried lidocaine gel and roll on lidocaine without improvement in his symptoms.   Dandruff- Reports that he has always had dandruff "to an extent."  Has worsened recently.  Has tried otc shampoos without improvement in his symptoms.    Review of Systems See HPI  Past Medical History:  Diagnosis Date  . Back pain 06/04/2015  . DVT of lower limb, acute ([place]) 01/13/2015  . High blood pressure   . High cholesterol   . History of chicken pox 01/13/2015  . Hyperlipidemia, mild 01/13/2015  . Hypokalemia 01/13/2015  . Obesity   . Verrucous skin lesion 02/08/2016     Social History   Socioeconomic History  . Marital status: Married    Spouse name: Not on file  . Number of children: 1  . Years of education: Not on file  . Highest education level: Not on file  Occupational History  . Occupation: Retired  Scientific laboratory technician  . Financial resource strain: Not on file  . Food insecurity    Worry: Not on file    Inability: Not on file  . Transportation needs    Medical: Not on file    Non-medical: Not on file  Tobacco Use  . Smoking status: Never Smoker  . Smokeless tobacco: Never Used  Substance and Sexual Activity  . Alcohol use: Yes    Alcohol/week: 0.0 standard drinks    Comment: 2-3 glasses of wine every night  . Drug use: No  . 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 Physical activity    Days per week: Not on file    Minutes per session: Not on file  . Stress: Not on file  Relationships  .  Social Herbalist on phone: Not on file    Gets together: Not on file    Attends religious service: Not on file    Active member of club or organization: Not on file    Attends meetings of clubs or organizations: Not on file    Relationship status: Not on file  . Intimate partner violence    Fear of current or ex partner: Not on file    Emotionally abused: Not on file    Physically abused: Not on file    Forced sexual activity: Not on file  Other Topics Concern  . Not on file  Social History Narrative  . Not on file    Past Surgical History:  Procedure Laterality Date  . EYE SURGERY Bilateral 02/2019   Dr Talbert Forest    Family History  Problem Relation Age of Onset  . Stroke Father        from surgery  . Emphysema Father        smoker  . Heart disease Father        carotid artery disease, vasculopathy  . Allergic Disorder Sister     Allergies  Allergen Reactions  . Codeine Anaphylaxis  Current Outpatient Medications on File Prior to Visit  Medication Sig Dispense Refill  . aspirin 81 MG tablet Take 81 mg by mouth daily. Reported on 05/06/2015    . diclofenac (VOLTAREN) 0.1 % ophthalmic solution 4 (four) times daily.    . hydrochlorothiazide (HYDRODIURIL) 25 MG tablet TAKE 1 TABLET BY MOUTH EVERY DAY 90 tablet 1  . losartan (COZAAR) 100 MG tablet TAKE 1 TABLET BY MOUTH EVERY DAY 90 tablet 1  . Multiple Vitamin (MULTIVITAMIN) tablet Take 1 tablet by mouth daily.    . pravastatin (PRAVACHOL) 40 MG tablet TAKE 1 TABLET BY MOUTH EVERY DAY 90 tablet 2  . triamcinolone cream (KENALOG) 0.1 % Apply 1 application topically 2 (two) times daily. 30 g 0   No current facility-administered medications on file prior to visit.     BP (!) 142/80 (BP Location: Right Arm, Patient Position: Sitting, Cuff Size: Large)   Pulse 70   Temp (!) 97.4 F (36.3 C) (Temporal)   Resp 16   Ht _0  (1.753 m)   Wt 238 lb (108 kg)   SpO2 98%   BMI 35.15 kg/m       Objective:    Physical Exam Constitutional:      General: He is not in acute distress.    Appearance: He is well-developed.  HENT:     Head: Normocephalic and atraumatic.  Cardiovascular:     Rate and Rhythm: Normal rate and regular rhythm.     Heart sounds: No murmur.  Pulmonary:     Effort: Pulmonary effort is normal. No respiratory distress.     Breath sounds: Normal breath sounds. No wheezing or rales.  Musculoskeletal:     Comments: Mild swelling of right knee. + mild tenderness to palpation medially  Skin:    General: Skin is warm and dry.     Comments: + dry flaking skin  noted on scalp  Neurological:     Mental Status: He is alert and oriented to person, place, and time.  Psychiatric:        Behavior: Behavior normal.        Thought Content: Thought content normal.           Assessment & Plan:   Osteoarthritis- trial of meloxicam. If symptoms worsen or fail to improve plan referral to ortho.  Seborrheic dermatitis- it appears that ketoconazole shampoo is non-formulary as is the cream.  I did find a good rx coupon for the cream. Advised pt to apply ketoconazole cream 3 times a week nightly for 4 weeks then prn. Continue to use otc shampoos such as selsun blue or neutrogenia t-gel.     This visit occurred during the SARS-CoV-2 public health emergency.  Safety protocols were in place, including screening questions prior to the visit, additional usage of staff PPE, and extensive cleaning of exam room while observing appropriate contact time as indicated for disinfecting solutions.

## 2019-04-07 NOTE — Patient Instructions (Addendum)
Please try meloxicam once daily for your knee pain. For your dandruff- apply ketoconazole 3 times weekly for 4 weeks.  Call if symptoms worsen or if symptoms fail to improve.

## 2019-04-08 DIAGNOSIS — G4733 Obstructive sleep apnea (adult) (pediatric): Secondary | ICD-10-CM | POA: Diagnosis not present

## 2019-04-15 DIAGNOSIS — G4733 Obstructive sleep apnea (adult) (pediatric): Secondary | ICD-10-CM | POA: Diagnosis not present

## 2019-04-21 ENCOUNTER — Other Ambulatory Visit: Payer: Self-pay | Admitting: Family

## 2019-04-21 MED ORDER — MELOXICAM 7.5 MG PO TABS: 7.5000 mg | ORAL_TABLET | Freq: Every day | ORAL | 0 refills | Status: DC

## 2019-05-08 DIAGNOSIS — G4733 Obstructive sleep apnea (adult) (pediatric): Secondary | ICD-10-CM | POA: Diagnosis not present

## 2019-05-11 DIAGNOSIS — G4733 Obstructive sleep apnea (adult) (pediatric): Secondary | ICD-10-CM | POA: Diagnosis not present

## 2019-05-16 DIAGNOSIS — G4733 Obstructive sleep apnea (adult) (pediatric): Secondary | ICD-10-CM | POA: Diagnosis not present

## 2019-05-17 ENCOUNTER — Telehealth: Payer: Self-pay | Admitting: Family Medicine

## 2019-05-17 NOTE — Telephone Encounter (Signed)
Patient declined AWV at this time. Christian [name] stated he will give office a call back to schedule wellness visit. SF

## 2019-05-19 ENCOUNTER — Other Ambulatory Visit: Payer: Self-pay | Admitting: Family Medicine

## 2019-06-08 ENCOUNTER — Ambulatory Visit: Payer: Medicare HMO

## 2019-06-08 DIAGNOSIS — G4733 Obstructive sleep apnea (adult) (pediatric): Secondary | ICD-10-CM | POA: Diagnosis not present

## 2019-06-09 DIAGNOSIS — D225 Melanocytic nevi of trunk: Secondary | ICD-10-CM | POA: Diagnosis not present

## 2019-06-09 DIAGNOSIS — L820 Inflamed seborrheic keratosis: Secondary | ICD-10-CM | POA: Diagnosis not present

## 2019-06-09 DIAGNOSIS — L570 Actinic keratosis: Secondary | ICD-10-CM | POA: Diagnosis not present

## 2019-06-09 DIAGNOSIS — L218 Other seborrheic dermatitis: Secondary | ICD-10-CM | POA: Diagnosis not present

## 2019-06-09 DIAGNOSIS — L821 Other seborrheic keratosis: Secondary | ICD-10-CM | POA: Diagnosis not present

## 2019-06-09 DIAGNOSIS — Z85828 Personal history of other malignant neoplasm of skin: Secondary | ICD-10-CM | POA: Diagnosis not present

## 2019-06-09 DIAGNOSIS — C44619 Basal cell carcinoma of skin of left upper limb, including shoulder: Secondary | ICD-10-CM | POA: Diagnosis not present

## 2019-06-16 DIAGNOSIS — G4733 Obstructive sleep apnea (adult) (pediatric): Secondary | ICD-10-CM | POA: Diagnosis not present

## 2019-06-17 ENCOUNTER — Ambulatory Visit: Payer: Medicare HMO | Attending: Internal Medicine

## 2019-06-17 DIAGNOSIS — Z23 Encounter for immunization: Secondary | ICD-10-CM | POA: Insufficient documentation

## 2019-06-17 NOTE — Progress Notes (Signed)
   Covid-19 Vaccination Clinic  Name:  CAYDN JUSTEN    MRN: [id number] DOB: [birth year]  06/17/2019  Mr. Gosling was observed post Covid-19 immunization for 30 minutes based on pre-vaccination screening without incidence. He was provided with Vaccine Information Sheet and instruction to access the V-Safe system.   Mr. Barajas was instructed to call 911 with any severe reactions post vaccine: [name] Difficulty breathing  . Swelling of your face and throat  . A fast heartbeat  . A bad rash all over your body  . Dizziness and weakness    Immunizations Administered    Name Date Dose VIS Date Route   Pfizer COVID-19 Vaccine 06/17/2019  2:23 PM 0.3 mL 04/23/2019 Intramuscular   Manufacturer: [place]   Lot: [id number]   [place]: [id number]

## 2019-07-09 DIAGNOSIS — G4733 Obstructive sleep apnea (adult) (pediatric): Secondary | ICD-10-CM | POA: Diagnosis not present

## 2019-07-12 ENCOUNTER — Ambulatory Visit: Payer: Medicare HMO | Attending: Internal Medicine

## 2019-07-12 ENCOUNTER — Ambulatory Visit: Payer: Medicare HMO

## 2019-07-12 DIAGNOSIS — Z23 Encounter for immunization: Secondary | ICD-10-CM | POA: Insufficient documentation

## 2019-07-12 NOTE — Progress Notes (Signed)
   Covid-19 Vaccination Clinic  Name:  Christian [name]    MRN: [id number] DOB: 1945/10/13  07/12/2019  [name] was observed post Covid-19 immunization for 30 minutes based on pre-vaccination screening without incidence. He was provided with Vaccine Information Sheet and instruction to access the V-Safe system.   [name] was instructed to call 911 with any severe reactions post vaccine: [name] Difficulty breathing  . Swelling of your face and throat  . A fast heartbeat  . A bad rash all over your body  . Dizziness and weakness    Immunizations Administered    Name Date Dose VIS Date Route   Pfizer COVID-19 Vaccine 07/12/2019  5:03 PM 0.3 mL 04/23/2019 Intramuscular   Manufacturer: [place]   Lot: [id number]   [place]: [id number]

## 2019-07-14 ENCOUNTER — Ambulatory Visit: Payer: Medicare HMO

## 2019-08-06 DIAGNOSIS — G4733 Obstructive sleep apnea (adult) (pediatric): Secondary | ICD-10-CM | POA: Diagnosis not present

## 2019-08-30 DIAGNOSIS — H16223 Keratoconjunctivitis sicca, not specified as Sjogren's, bilateral: Secondary | ICD-10-CM | POA: Diagnosis not present

## 2019-08-30 DIAGNOSIS — H353131 Nonexudative age-related macular degeneration, bilateral, early dry stage: Secondary | ICD-10-CM | POA: Diagnosis not present

## 2019-09-01 ENCOUNTER — Other Ambulatory Visit: Payer: Self-pay | Admitting: Family Medicine

## 2019-09-06 DIAGNOSIS — G4733 Obstructive sleep apnea (adult) (pediatric): Secondary | ICD-10-CM | POA: Diagnosis not present

## 2019-10-06 DIAGNOSIS — G4733 Obstructive sleep apnea (adult) (pediatric): Secondary | ICD-10-CM | POA: Diagnosis not present

## 2019-11-06 DIAGNOSIS — G4733 Obstructive sleep apnea (adult) (pediatric): Secondary | ICD-10-CM | POA: Diagnosis not present

## 2019-11-28 ENCOUNTER — Other Ambulatory Visit: Payer: Self-pay | Admitting: Family Medicine

## 2019-12-06 DIAGNOSIS — G4733 Obstructive sleep apnea (adult) (pediatric): Secondary | ICD-10-CM | POA: Diagnosis not present

## 2019-12-11 ENCOUNTER — Other Ambulatory Visit: Payer: Self-pay | Admitting: Family Medicine

## 2019-12-31 DIAGNOSIS — G4733 Obstructive sleep apnea (adult) (pediatric): Secondary | ICD-10-CM | POA: Diagnosis not present

## 2020-02-06 ENCOUNTER — Other Ambulatory Visit: Payer: Self-pay | Admitting: Family Medicine

## 2020-02-15 DIAGNOSIS — R69 Illness, unspecified: Secondary | ICD-10-CM | POA: Diagnosis not present

## 2020-02-22 ENCOUNTER — Other Ambulatory Visit: Payer: Self-pay | Admitting: Family Medicine

## 2020-03-01 ENCOUNTER — Ambulatory Visit: Payer: Medicare HMO | Admitting: Internal Medicine

## 2020-03-06 ENCOUNTER — Other Ambulatory Visit: Payer: Self-pay | Admitting: Family Medicine

## 2020-05-01 ENCOUNTER — Other Ambulatory Visit: Payer: Self-pay | Admitting: Family Medicine

## 2020-05-17 ENCOUNTER — Other Ambulatory Visit: Payer: Self-pay | Admitting: Family Medicine

## 2020-05-26 ENCOUNTER — Other Ambulatory Visit: Payer: Self-pay | Admitting: Family Medicine

## 2020-05-28 ENCOUNTER — Other Ambulatory Visit: Payer: Self-pay | Admitting: Family Medicine

## 2020-06-08 DIAGNOSIS — C44612 Basal cell carcinoma of skin of right upper limb, including shoulder: Secondary | ICD-10-CM | POA: Diagnosis not present

## 2020-06-08 DIAGNOSIS — D1801 Hemangioma of skin and subcutaneous tissue: Secondary | ICD-10-CM | POA: Diagnosis not present

## 2020-06-08 DIAGNOSIS — L821 Other seborrheic keratosis: Secondary | ICD-10-CM | POA: Diagnosis not present

## 2020-06-08 DIAGNOSIS — D224 Melanocytic nevi of scalp and neck: Secondary | ICD-10-CM | POA: Diagnosis not present

## 2020-06-08 DIAGNOSIS — L814 Other melanin hyperpigmentation: Secondary | ICD-10-CM | POA: Diagnosis not present

## 2020-06-08 DIAGNOSIS — L570 Actinic keratosis: Secondary | ICD-10-CM | POA: Diagnosis not present

## 2020-06-08 DIAGNOSIS — D2262 Melanocytic nevi of left upper limb, including shoulder: Secondary | ICD-10-CM | POA: Diagnosis not present

## 2020-06-08 DIAGNOSIS — Z85828 Personal history of other malignant neoplasm of skin: Secondary | ICD-10-CM | POA: Diagnosis not present

## 2020-06-13 DIAGNOSIS — Z0100 Encounter for examination of eyes and vision without abnormal findings: Secondary | ICD-10-CM | POA: Diagnosis not present

## 2020-06-13 DIAGNOSIS — H353131 Nonexudative age-related macular degeneration, bilateral, early dry stage: Secondary | ICD-10-CM | POA: Diagnosis not present

## 2020-06-13 DIAGNOSIS — H16223 Keratoconjunctivitis sicca, not specified as Sjogren's, bilateral: Secondary | ICD-10-CM | POA: Diagnosis not present

## 2020-06-19 ENCOUNTER — Other Ambulatory Visit: Payer: Self-pay | Admitting: Family Medicine

## 2020-06-19 MED ORDER — LOSARTAN POTASSIUM 100 MG PO TABS: 100.0000 mg | ORAL_TABLET | Freq: Every day | ORAL | 0 refills | Status: DC

## 2020-06-19 NOTE — Addendum Note (Signed)
Addended by: Kem Boroughs D on: 06/19/2020 04:16 PM   Modules accepted: Orders

## 2020-06-21 ENCOUNTER — Other Ambulatory Visit: Payer: Self-pay | Admitting: Family Medicine

## 2020-06-26 DIAGNOSIS — G4733 Obstructive sleep apnea (adult) (pediatric): Secondary | ICD-10-CM | POA: Diagnosis not present

## 2020-08-12 ENCOUNTER — Other Ambulatory Visit: Payer: Self-pay | Admitting: Family Medicine

## 2020-08-20 ENCOUNTER — Other Ambulatory Visit: Payer: Self-pay | Admitting: Family Medicine

## 2020-08-21 MED ORDER — PRAVASTATIN SODIUM 40 MG PO TABS: 40.0000 mg | ORAL_TABLET | Freq: Every day | ORAL | 1 refills | Status: DC

## 2020-08-22 ENCOUNTER — Encounter: Payer: Self-pay | Admitting: Family Medicine

## 2020-08-22 ENCOUNTER — Telehealth: Payer: Self-pay | Admitting: *Deleted

## 2020-08-22 ENCOUNTER — Other Ambulatory Visit: Payer: Self-pay

## 2020-08-22 ENCOUNTER — Telehealth (INDEPENDENT_AMBULATORY_CARE_PROVIDER_SITE_OTHER): Payer: Medicare HMO | Admitting: Family Medicine

## 2020-08-22 VITALS — BP 138/75 | HR 79 | Temp 98.5°F | Wt 230.0 lb

## 2020-08-22 DIAGNOSIS — R351 Nocturia: Secondary | ICD-10-CM

## 2020-08-22 DIAGNOSIS — I10 Essential (primary) hypertension: Secondary | ICD-10-CM

## 2020-08-22 DIAGNOSIS — E785 Hyperlipidemia, unspecified: Secondary | ICD-10-CM

## 2020-08-22 DIAGNOSIS — L578 Other skin changes due to chronic exposure to nonionizing radiation: Secondary | ICD-10-CM

## 2020-08-22 DIAGNOSIS — R739 Hyperglycemia, unspecified: Secondary | ICD-10-CM | POA: Diagnosis not present

## 2020-08-22 DIAGNOSIS — E669 Obesity, unspecified: Secondary | ICD-10-CM | POA: Diagnosis not present

## 2020-08-22 NOTE — Telephone Encounter (Signed)
Left message on machine to call back to schedule next appointment and lab only appointment.  Per Dr. Charlett Blake needs a lab appt no sooner than a week from now he is going camping and then a cpe in about 6 months

## 2020-08-23 NOTE — Progress Notes (Signed)
MyChart Video Visit    Virtual Visit via Video Note   This visit type was conducted due to national recommendations for restrictions regarding the COVID-19 Pandemic (e.g. social distancing) in an effort to limit this patient's exposure and mitigate transmission in our community. This patient is at least at moderate risk for complications without adequate follow up. This format is felt to be most appropriate for this patient at this time. Physical exam was limited by quality of the video and audio technology used for the visit. Nena Alexander, CMA was able to get the patient set up on a video visit.  Patient location: home Patient and provider in visit Provider location: Office  I discussed the limitations of evaluation and management by telemedicine and the availability of in person appointments. The patient expressed understanding and agreed to proceed.  Visit Date: 08/22/2020  Today's healthcare provider: Penni Homans, MD     Subjective:    Patient ID: Christian [name], male    DOB: 1946-04-08, 75 y.o.   MRN: [id number]  Chief Complaint  Patient presents with  . Follow-up    HPI Patient is in today for follow up on chronic medical concerns. No recent febrile illness or hospitalizations. He has been eating better and has cut out all simple sugars, he is walking 2-3 miles a day. He has had some weight loss and notes he feels better. He was having some knee pain until the weight loss and he is feeling better now. He had his COVID booster in September 2021 at [facility] in [place] and he had his flu shot at the same time. Denies CP/palp/SOB/HA/congestion/fevers/GI or GU c/o. Taking meds as prescribed  Past Medical History:  Diagnosis Date  . Back pain 06/04/2015  . DVT of lower limb, acute ([place]) 01/13/2015  . High blood pressure   . High cholesterol   . History of chicken pox 01/13/2015  . Hyperlipidemia, mild 01/13/2015  . Hypokalemia 01/13/2015  . Obesity   . Verrucous skin lesion  02/08/2016    Past Surgical History:  Procedure Laterality Date  . EYE SURGERY Bilateral 02/2019   Dr Talbert Forest    Family History  Problem Relation Age of Onset  . Stroke Father        from surgery  . Emphysema Father        smoker  . Heart disease Father        carotid artery disease, vasculopathy  . Allergic Disorder Sister     Social History   Socioeconomic History  . Marital status: Married    Spouse name: Not on file  . Number of children: 1  . Years of education: Not on file  . Highest education level: Not on file  Occupational History  . Occupation: Retired  Tobacco Use  . Smoking status: Never Smoker  . Smokeless tobacco: Never Used  Substance and Sexual Activity  . Alcohol use: Yes    Alcohol/week: 0.0 standard drinks    Comment: 2-3 glasses of wine every night  . Drug use: No  . 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 Not on file  Social History Narrative  . Not on file   Social Determinants of Health   Financial Resource Strain: Not on file  Food Insecurity: Not on file  Transportation Needs: Not on file  Physical Activity: Not on file  Stress: Not on file  Social  Connections: Not on file  Intimate Partner Violence: Not on file    Outpatient Medications Prior to Visit  Medication Sig Dispense Refill  . hydrochlorothiazide (HYDRODIURIL) 25 MG tablet TAKE 1 TABLET BY MOUTH EVERY DAY 90 tablet 0  . losartan (COZAAR) 100 MG tablet Take 1 tablet (100 mg total) by mouth daily. 90 tablet 0  . Multiple Vitamin (MULTIVITAMIN) tablet Take 1 tablet by mouth daily.    . pravastatin (PRAVACHOL) 40 MG tablet Take 1 tablet (40 mg total) by mouth daily. 90 tablet 1  . meloxicam (MOBIC) 7.5 MG tablet Take 1 tablet (7.5 mg total) by mouth daily. (Patient not taking: Reported on 08/22/2020) 14 tablet 0  . aspirin 81 MG tablet Take 81 mg by mouth daily. Reported on  05/06/2015    . diclofenac (VOLTAREN) 0.1 % ophthalmic solution 4 (four) times daily.    [name] ketoconazole (NIZORAL) 2 % cream Apply 1 application topically 3 (three) times a week. 60 g 0  . triamcinolone cream (KENALOG) 0.1 % Apply 1 application topically 2 (two) times daily. 30 g 0   No facility-administered medications prior to visit.    Allergies  Allergen Reactions  . Codeine Anaphylaxis    Review of Systems  Constitutional: Negative for fever and malaise/fatigue.  HENT: Negative for congestion.   Eyes: Negative for blurred vision.  Respiratory: Negative for shortness of breath.   Cardiovascular: Negative for chest pain, palpitations and leg swelling.  Gastrointestinal: Negative for abdominal pain, blood in stool and nausea.  Genitourinary: Negative for dysuria and frequency.  Musculoskeletal: Negative for falls.  Skin: Negative for rash.  Neurological: Negative for dizziness, loss of consciousness and headaches.  Endo/Heme/Allergies: Negative for environmental allergies.  Psychiatric/Behavioral: Negative for depression. The patient is not nervous/anxious.        Objective:    Physical Exam Constitutional:      Appearance: Normal appearance. He is not ill-appearing.  HENT:     Head: Normocephalic and atraumatic.     Right Ear: External ear normal.     Left Ear: External ear normal.     Nose: Nose normal.  Eyes:     General:        Right eye: No discharge.        Left eye: No discharge.  Pulmonary:     Effort: Pulmonary effort is normal.  Neurological:     Mental Status: He is alert and oriented to person, place, and time.  Psychiatric:        Behavior: Behavior normal.     BP 138/75   Pulse 79   Temp 98.5 F (36.9 C)   Wt 230 lb (104.3 kg)   SpO2 96%   BMI 33.97 kg/m  Wt Readings from Last 3 Encounters:  08/22/20 230 lb (104.3 kg)  04/07/19 238 lb (108 kg)  12/03/18 233 lb (105.7 kg)    Diabetic Foot Exam - Simple   No data filed    Lab Results   Component Value Date   WBC 5.8 11/26/2018   HGB 14.0 11/26/2018   HCT 42.2 11/26/2018   PLT 204.0 11/26/2018   GLUCOSE 93 11/26/2018   CHOL 164 11/26/2018   TRIG 100.0 11/26/2018   HDL 49.10 11/26/2018   LDLCALC 95 11/26/2018   ALT 22 11/26/2018   AST 21 11/26/2018   NA 140 11/26/2018   K 3.9 11/26/2018   CL 102 11/26/2018   CREATININE 0.79 11/26/2018   BUN 12 11/26/2018   CO2 29 11/26/2018  TSH 3.40 11/26/2018   PSA 1.59 05/20/2017   HGBA1C 5.8 11/26/2018    Lab Results  Component Value Date   TSH 3.40 11/26/2018   Lab Results  Component Value Date   WBC 5.8 11/26/2018   HGB 14.0 11/26/2018   HCT 42.2 11/26/2018   MCV 94.2 11/26/2018   PLT 204.0 11/26/2018   Lab Results  Component Value Date   NA 140 11/26/2018   K 3.9 11/26/2018   CO2 29 11/26/2018   GLUCOSE 93 11/26/2018   BUN 12 11/26/2018   CREATININE 0.79 11/26/2018   BILITOT 0.6 11/26/2018   ALKPHOS 65 11/26/2018   AST 21 11/26/2018   ALT 22 11/26/2018   PROT 7.2 11/26/2018   ALBUMIN 4.3 11/26/2018   CALCIUM 9.3 11/26/2018   ANIONGAP 10 12/21/2014   GFR 95.91 11/26/2018   Lab Results  Component Value Date   CHOL 164 11/26/2018   Lab Results  Component Value Date   HDL 49.10 11/26/2018   Lab Results  Component Value Date   LDLCALC 95 11/26/2018   Lab Results  Component Value Date   TRIG 100.0 11/26/2018   Lab Results  Component Value Date   CHOLHDL 3 11/26/2018   Lab Results  Component Value Date   HGBA1C 5.8 11/26/2018       Assessment & Plan:   Problem List Items Addressed This Visit    Hyperlipidemia, mild    Encouraged heart healthy diet, increase exercise, avoid trans fats, consider a krill oil cap daily. Tolerating Pravastatin, refill given      Relevant Orders   Lipid panel   Obesity    He has been walking 2-3 miles a day and eating well, avoiding most sugar and reports some weight loss.       Sun-damaged skin - Primary   Benign essential HTN    Monitor and  report any concerns, no changes to meds. Encouraged heart healthy diet such as the DASH diet and exercise as tolerated.       Relevant Orders   CBC   Comprehensive metabolic panel   TSH   Hyperglycemia    hgba1c acceptable, minimize simple carbs. Increase exercise as tolerated.       Relevant Orders   Hemoglobin A1c    Other Visit Diagnoses    Nocturia       Relevant Orders   PSA      I have discontinued [name] A. Keirsey "Tom"'s aspirin, triamcinolone cream, diclofenac, and ketoconazole. I am also having him maintain his multivitamin, meloxicam, losartan, hydrochlorothiazide, and pravastatin.  No orders of the defined types were placed in this encounter.   I discussed the assessment and treatment plan with the patient. The patient was provided an opportunity to ask questions and all were answered. The patient agreed with the plan and demonstrated an understanding of the instructions.   The patient was advised to call back or seek an in-person evaluation if the symptoms worsen or if the condition fails to improve as anticipated.  I provided 22 minutes of face-to-face time during this encounter.   Penni Homans, MD [facility] at [facility] [phone] (phone) [phone] (fax)  [place]

## 2020-08-23 NOTE — Assessment & Plan Note (Signed)
Encouraged heart healthy diet, increase exercise, avoid trans fats, consider a krill oil cap daily. Tolerating Pravastatin, refill given

## 2020-08-23 NOTE — Assessment & Plan Note (Signed)
Monitor and report any concerns, no changes to meds. Encouraged heart healthy diet such as the DASH diet and exercise as tolerated.  ?

## 2020-08-23 NOTE — Assessment & Plan Note (Signed)
hgba1c acceptable, minimize simple carbs. Increase exercise as tolerated.  

## 2020-08-23 NOTE — Assessment & Plan Note (Signed)
He has been walking 2-3 miles a day and eating well, avoiding most sugar and reports some weight loss.

## 2020-08-30 ENCOUNTER — Other Ambulatory Visit (INDEPENDENT_AMBULATORY_CARE_PROVIDER_SITE_OTHER): Payer: Medicare HMO

## 2020-08-30 ENCOUNTER — Other Ambulatory Visit: Payer: Self-pay

## 2020-08-30 DIAGNOSIS — E785 Hyperlipidemia, unspecified: Secondary | ICD-10-CM

## 2020-08-30 DIAGNOSIS — R739 Hyperglycemia, unspecified: Secondary | ICD-10-CM

## 2020-08-30 DIAGNOSIS — I10 Essential (primary) hypertension: Secondary | ICD-10-CM

## 2020-08-30 DIAGNOSIS — R351 Nocturia: Secondary | ICD-10-CM

## 2020-08-30 LAB — LIPID PANEL
Cholesterol: 161 mg/dL (ref 0–200)
HDL: 47.1 mg/dL (ref >39.00)
LDL Cholesterol: 95 mg/dL (ref 0–99)
NonHDL: 113.86
Total CHOL/HDL Ratio: 3
Triglycerides: 92 mg/dL (ref 0.0–149.0)
VLDL: 18.4 mg/dL (ref 0.0–40.0)

## 2020-08-30 LAB — CBC
HCT: 41.9 % (ref 39.0–52.0)
Hemoglobin: 14.1 g/dL (ref 13.0–17.0)
MCHC: 33.8 g/dL (ref 30.0–36.0)
MCV: 94 fl (ref 78.0–100.0)
Platelets: 216 10*3/uL (ref 150.0–400.0)
RBC: 4.45 Mil/uL (ref 4.22–5.81)
RDW: 13.4 % (ref 11.5–15.5)
WBC: 6.2 10*3/uL (ref 4.0–10.5)

## 2020-08-30 LAB — COMPREHENSIVE METABOLIC PANEL
ALT: 31 U/L (ref 0–53)
AST: 25 U/L (ref 0–37)
Albumin: 4.1 g/dL (ref 3.5–5.2)
Alkaline Phosphatase: 76 U/L (ref 39–117)
BUN: 10 mg/dL (ref 6–23)
CO2: 29 mEq/L (ref 19–32)
Calcium: 9.5 mg/dL (ref 8.4–10.5)
Chloride: 102 mEq/L (ref 96–112)
Creatinine, Ser: 0.79 mg/dL (ref 0.40–1.50)
GFR: 86.83 mL/min (ref >60.00)
Glucose, Bld: 108 mg/dL — ABNORMAL HIGH (ref 70–99)
Potassium: 3.9 mEq/L (ref 3.5–5.1)
Sodium: 139 mEq/L (ref 135–145)
Total Bilirubin: 0.8 mg/dL (ref 0.2–1.2)
Total Protein: 7.4 g/dL (ref 6.0–8.3)

## 2020-08-30 LAB — TSH: TSH: 2.44 u[IU]/mL (ref 0.35–4.50)

## 2020-08-30 LAB — PSA: PSA: 1.57 ng/mL (ref 0.10–4.00)

## 2020-08-30 LAB — HEMOGLOBIN A1C: Hgb A1c MFr Bld: 5.8 % (ref 4.6–6.5)

## 2020-09-14 ENCOUNTER — Other Ambulatory Visit: Payer: Self-pay | Admitting: Family Medicine

## 2020-09-20 DIAGNOSIS — G4733 Obstructive sleep apnea (adult) (pediatric): Secondary | ICD-10-CM | POA: Diagnosis not present

## 2020-10-21 DIAGNOSIS — G4733 Obstructive sleep apnea (adult) (pediatric): Secondary | ICD-10-CM | POA: Diagnosis not present

## 2020-11-08 ENCOUNTER — Other Ambulatory Visit: Payer: Self-pay | Admitting: Family Medicine

## 2020-11-20 DIAGNOSIS — G4733 Obstructive sleep apnea (adult) (pediatric): Secondary | ICD-10-CM | POA: Diagnosis not present

## 2020-12-18 DIAGNOSIS — G4733 Obstructive sleep apnea (adult) (pediatric): Secondary | ICD-10-CM | POA: Diagnosis not present

## 2021-01-18 DIAGNOSIS — G4733 Obstructive sleep apnea (adult) (pediatric): Secondary | ICD-10-CM | POA: Diagnosis not present

## 2021-02-03 ENCOUNTER — Other Ambulatory Visit: Payer: Self-pay | Admitting: Family Medicine

## 2021-02-08 ENCOUNTER — Other Ambulatory Visit: Payer: Self-pay | Admitting: Family Medicine

## 2021-02-17 DIAGNOSIS — G4733 Obstructive sleep apnea (adult) (pediatric): Secondary | ICD-10-CM | POA: Diagnosis not present

## 2021-02-19 ENCOUNTER — Telehealth: Payer: Self-pay | Admitting: Family Medicine

## 2021-02-19 NOTE — Telephone Encounter (Signed)
Patient states he feels like he has been having a minor irritation near his gallbladder. He wants advise on what he do, or if there is a medicine he can take for it. Please advise.

## 2021-02-19 NOTE — Telephone Encounter (Signed)
Pt aware and will call back if it gets worse

## 2021-03-01 ENCOUNTER — Other Ambulatory Visit: Payer: Self-pay

## 2021-03-01 ENCOUNTER — Telehealth (INDEPENDENT_AMBULATORY_CARE_PROVIDER_SITE_OTHER): Payer: Medicare HMO | Admitting: Family Medicine

## 2021-03-01 DIAGNOSIS — R739 Hyperglycemia, unspecified: Secondary | ICD-10-CM | POA: Diagnosis not present

## 2021-03-01 DIAGNOSIS — M25551 Pain in right hip: Secondary | ICD-10-CM | POA: Diagnosis not present

## 2021-03-01 DIAGNOSIS — E785 Hyperlipidemia, unspecified: Secondary | ICD-10-CM | POA: Diagnosis not present

## 2021-03-01 DIAGNOSIS — I10 Essential (primary) hypertension: Secondary | ICD-10-CM

## 2021-03-01 MED ORDER — CYCLOBENZAPRINE HCL 5 MG PO TABS: 2.5000 mg | ORAL_TABLET | Freq: Two times a day (BID) | ORAL | 1 refills | Status: DC | PRN

## 2021-03-01 NOTE — Progress Notes (Signed)
MyChart Video Visit    Virtual Visit via Video Note   This visit type was conducted due to national recommendations for restrictions regarding the COVID-19 Pandemic (e.g. social distancing) in an effort to limit this patient's exposure and mitigate transmission in our community. This patient is at least at moderate risk for complications without adequate follow up. This format is felt to be most appropriate for this patient at this time. Physical exam was limited by quality of the video and audio technology used for the visit. S. Chism, CMA was able to get the patient set up on a video visit.  Patient location: home Patient and provider in visit Provider location: Office  I discussed the limitations of evaluation and management by telemedicine and the availability of in person appointments. The patient expressed understanding and agreed to proceed.  Visit Date: 03/04/21  Today's healthcare provider: Penni Homans, MD     Subjective:    Patient ID: Christian [name], male    DOB: 1946-03-28, 75 y.o.   MRN: [id number]  Chief Complaint  Patient presents with   Follow-up    HPI Patient is in today for a virtual visit for follow up on HTN and back pain. He reports that he is doing ok and has no recent febrile illnesses or ER visits to report. He is dealing with lower right sided back pain that only flares up with changing positions. He reports that tylenol works well in bringing pain levels down. It has been persistent now for a month. He denies any pain radiating to LE's and has no problems with incontinence. Denies CP/palp/SOB/HA/congestion/fevers/GI or GU c/o. Taking meds as prescribed  He was able to lose weight and he attributes that to cutting down on drinking alcohol.   Past Medical History:  Diagnosis Date   Back pain 06/04/2015   DVT of lower limb, acute ([place]) 01/13/2015   High blood pressure    High cholesterol    History of chicken pox 01/13/2015   Hyperlipidemia, mild  01/13/2015   Hypokalemia 01/13/2015   Obesity    Verrucous skin lesion 02/08/2016    Past Surgical History:  Procedure Laterality Date   EYE SURGERY Bilateral 02/2019   Dr Talbert Forest    Family History  Problem Relation Age of Onset   Stroke Father        from surgery   Emphysema Father        smoker   Heart disease Father        carotid artery disease, vasculopathy   Allergic Disorder Sister     Social History   Socioeconomic History   Marital status: Married    Spouse name: Not on file   Number of children: 1   Years of education: Not on file   Highest education level: Not on file  Occupational History   Occupation: Retired  Tobacco Use   Smoking status: Never   Smokeless tobacco: Never  Substance and Sexual Activity   Alcohol use: Yes    Alcohol/week: 0.0 standard drinks    Comment: 2-3 glasses of wine every night   Drug use: No   Sexual activity: Yes    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, retired from Web designer, no dietary restrictions  Other Topics Concern   Not on file  Social History Narrative   Not on file   Social Determinants of Health   Financial Resource Strain: Not on file  Food Insecurity: Not on file  Transportation Needs: Not on file  Physical Activity: Not on file  Stress: Not on file  Social Connections: Not on file  Intimate Partner Violence: Not on file    Outpatient Medications Prior to Visit  Medication Sig Dispense Refill   hydrochlorothiazide (HYDRODIURIL) 25 MG tablet TAKE 1 TABLET BY MOUTH EVERY DAY 90 tablet 1   losartan (COZAAR) 100 MG tablet TAKE 1 TABLET BY MOUTH EVERY DAY 90 tablet 1   Multiple Vitamin (MULTIVITAMIN) tablet Take 1 tablet by mouth daily.     pravastatin (PRAVACHOL) 40 MG tablet TAKE 1 TABLET BY MOUTH EVERY DAY 90 tablet 1   meloxicam (MOBIC) 7.5 MG tablet Take 1 tablet (7.5 mg total) by mouth daily. (Patient not taking: Reported on 03/01/2021) 14 tablet 0   No facility-administered medications prior to visit.     Allergies  Allergen Reactions   Codeine Anaphylaxis    Review of Systems  Constitutional:  Negative for chills, fever and malaise/fatigue.  HENT:  Negative for congestion, sinus pain and sore throat.   Eyes:  Negative for blurred vision.  Respiratory:  Negative for cough and shortness of breath.   Cardiovascular:  Negative for chest pain, palpitations and leg swelling.  Gastrointestinal:  Negative for blood in stool, diarrhea, nausea and vomiting.  Genitourinary:  Negative for flank pain and frequency.  Musculoskeletal:  Positive for back pain.  Skin:  Negative for rash.  Neurological:  Negative for headaches.      Objective:    Physical Exam Constitutional:      Appearance: Normal appearance.  HENT:     Head: Normocephalic and atraumatic.     Right Ear: External ear normal.     Left Ear: External ear normal.  Pulmonary:     Effort: Pulmonary effort is normal.  Musculoskeletal:     Cervical back: No rigidity.  Neurological:     Mental Status: He is alert and oriented to person, place, and time.  Psychiatric:        Behavior: Behavior normal.    There were no vitals taken for this visit. Wt Readings from Last 3 Encounters:  08/22/20 230 lb (104.3 kg)  04/07/19 238 lb (108 kg)  12/03/18 233 lb (105.7 kg)    Diabetic Foot Exam - Simple   No data filed    Lab Results  Component Value Date   WBC 6.2 08/30/2020   HGB 14.1 08/30/2020   HCT 41.9 08/30/2020   PLT 216.0 08/30/2020   GLUCOSE 108 (H) 08/30/2020   CHOL 161 08/30/2020   TRIG 92.0 08/30/2020   HDL 47.10 08/30/2020   LDLCALC 95 08/30/2020   ALT 31 08/30/2020   AST 25 08/30/2020   NA 139 08/30/2020   K 3.9 08/30/2020   CL 102 08/30/2020   CREATININE 0.79 08/30/2020   BUN 10 08/30/2020   CO2 29 08/30/2020   TSH 2.44 08/30/2020   PSA 1.57 08/30/2020   HGBA1C 5.8 08/30/2020    Lab Results  Component Value Date   TSH 2.44 08/30/2020   Lab Results  Component Value Date   WBC 6.2  08/30/2020   HGB 14.1 08/30/2020   HCT 41.9 08/30/2020   MCV 94.0 08/30/2020   PLT 216.0 08/30/2020   Lab Results  Component Value Date   NA 139 08/30/2020   K 3.9 08/30/2020   CO2 29 08/30/2020   GLUCOSE 108 (H) 08/30/2020   BUN 10 08/30/2020   CREATININE 0.79 08/30/2020   BILITOT 0.8 08/30/2020   ALKPHOS 76 08/30/2020   AST 25  08/30/2020   ALT 31 08/30/2020   PROT 7.4 08/30/2020   ALBUMIN 4.1 08/30/2020   CALCIUM 9.5 08/30/2020   ANIONGAP 10 12/21/2014   GFR 86.83 08/30/2020   Lab Results  Component Value Date   CHOL 161 08/30/2020   Lab Results  Component Value Date   HDL 47.10 08/30/2020   Lab Results  Component Value Date   LDLCALC 95 08/30/2020   Lab Results  Component Value Date   TRIG 92.0 08/30/2020   Lab Results  Component Value Date   CHOLHDL 3 08/30/2020   Lab Results  Component Value Date   HGBA1C 5.8 08/30/2020       Assessment & Plan:   Problem List Items Addressed This Visit     Hyperlipidemia, mild    Encourage heart healthy diet such as MIND or DASH diet, increase exercise, avoid trans fats, simple carbohydrates and processed foods, consider a krill or fish or flaxseed oil cap daily.       Benign essential HTN    Monitor and report any concerns, no changes to meds. Encouraged heart healthy diet such as the DASH diet and exercise as tolerated.       Hyperglycemia    hgba1c acceptable, minimize simple carbs. Increase exercise as tolerated.       Right hip pain    Encouraged moist heat and gentle stretching as tolerated. May try NSAIDs and prescription meds as directed and report if symptoms worsen or seek immediate care         Meds ordered this encounter  Medications   cyclobenzaprine (FLEXERIL) 5 MG tablet    Sig: Take 0.5-1 tablets (2.5-5 mg total) by mouth 2 (two) times daily as needed for muscle spasms.    Dispense:  30 tablet    Refill:  1     I discussed the assessment and treatment plan with the patient. The  patient was provided an opportunity to ask questions and all were answered. The patient agreed with the plan and demonstrated an understanding of the instructions.   The patient was advised to call back or seek an in-person evaluation if the symptoms worsen or if the condition fails to improve as anticipated.  I provided 18 minutes of face-to-face time during this encounter.   Penni Homans, MD [facility] at [facility] [phone] (phone) [phone] (fax)  Morris, Suezanne Jacquet, acting as a scribe for Penni Homans, MD, have documented all relevent documentation on behalf of Penni Homans, MD, as directed by Penni Homans, MD while in the presence of Penni Homans, MD. DO:03/04/21.  I, Mosie Lukes, MD personally performed the services described in this documentation. All medical record entries made by the scribe were at my direction and in my presence. I have reviewed the chart and agree that the record reflects my personal performance and is accurate and complete

## 2021-03-04 DIAGNOSIS — M25551 Pain in right hip: Secondary | ICD-10-CM | POA: Insufficient documentation

## 2021-03-04 NOTE — Assessment & Plan Note (Signed)
Encourage heart healthy diet such as MIND or DASH diet, increase exercise, avoid trans fats, simple carbohydrates and processed foods, consider a krill or fish or flaxseed oil cap daily.

## 2021-03-04 NOTE — Assessment & Plan Note (Signed)
Encouraged moist heat and gentle stretching as tolerated. May try NSAIDs and prescription meds as directed and report if symptoms worsen or seek immediate care

## 2021-03-04 NOTE — Assessment & Plan Note (Signed)
Monitor and report any concerns, no changes to meds. Encouraged heart healthy diet such as the DASH diet and exercise as tolerated.  ?

## 2021-03-04 NOTE — Assessment & Plan Note (Signed)
hgba1c acceptable, minimize simple carbs. Increase exercise as tolerated.  

## 2021-03-07 DIAGNOSIS — L918 Other hypertrophic disorders of the skin: Secondary | ICD-10-CM | POA: Diagnosis not present

## 2021-03-07 DIAGNOSIS — L570 Actinic keratosis: Secondary | ICD-10-CM | POA: Diagnosis not present

## 2021-03-07 DIAGNOSIS — L720 Epidermal cyst: Secondary | ICD-10-CM | POA: Diagnosis not present

## 2021-03-15 ENCOUNTER — Other Ambulatory Visit: Payer: Self-pay | Admitting: Family Medicine

## 2021-04-02 ENCOUNTER — Other Ambulatory Visit: Payer: Self-pay

## 2021-04-03 ENCOUNTER — Encounter: Payer: Medicare HMO | Admitting: Family Medicine

## 2021-04-23 ENCOUNTER — Other Ambulatory Visit: Payer: Self-pay

## 2021-04-23 ENCOUNTER — Encounter (HOSPITAL_BASED_OUTPATIENT_CLINIC_OR_DEPARTMENT_OTHER): Payer: Self-pay | Admitting: *Deleted

## 2021-04-23 ENCOUNTER — Emergency Department (HOSPITAL_BASED_OUTPATIENT_CLINIC_OR_DEPARTMENT_OTHER): Payer: Medicare HMO

## 2021-04-23 ENCOUNTER — Observation Stay (HOSPITAL_BASED_OUTPATIENT_CLINIC_OR_DEPARTMENT_OTHER)
Admission: EM | Admit: 2021-04-23 | Discharge: 2021-04-25 | Disposition: A | Discharge status: Home / Self Care | Payer: Medicare HMO | Attending: Internal Medicine | Admitting: Internal Medicine

## 2021-04-23 DIAGNOSIS — I10 Essential (primary) hypertension: Secondary | ICD-10-CM

## 2021-04-23 DIAGNOSIS — K760 Fatty (change of) liver, not elsewhere classified: Secondary | ICD-10-CM | POA: Diagnosis not present

## 2021-04-23 DIAGNOSIS — R778 Other specified abnormalities of plasma proteins: Secondary | ICD-10-CM

## 2021-04-23 DIAGNOSIS — E785 Hyperlipidemia, unspecified: Secondary | ICD-10-CM | POA: Diagnosis present

## 2021-04-23 DIAGNOSIS — Z6835 Body mass index (BMI) 35.0-35.9, adult: Secondary | ICD-10-CM

## 2021-04-23 DIAGNOSIS — G4733 Obstructive sleep apnea (adult) (pediatric): Secondary | ICD-10-CM | POA: Diagnosis not present

## 2021-04-23 DIAGNOSIS — R072 Precordial pain: Secondary | ICD-10-CM | POA: Diagnosis not present

## 2021-04-23 DIAGNOSIS — R7889 Finding of other specified substances, not normally found in blood: Secondary | ICD-10-CM | POA: Diagnosis not present

## 2021-04-23 DIAGNOSIS — Z79899 Other long term (current) drug therapy: Secondary | ICD-10-CM | POA: Insufficient documentation

## 2021-04-23 DIAGNOSIS — I77810 Thoracic aortic ectasia: Secondary | ICD-10-CM | POA: Diagnosis not present

## 2021-04-23 DIAGNOSIS — I2510 Atherosclerotic heart disease of native coronary artery without angina pectoris: Secondary | ICD-10-CM | POA: Insufficient documentation

## 2021-04-23 DIAGNOSIS — Z20822 Contact with and (suspected) exposure to covid-19: Secondary | ICD-10-CM | POA: Insufficient documentation

## 2021-04-23 DIAGNOSIS — E669 Obesity, unspecified: Secondary | ICD-10-CM

## 2021-04-23 DIAGNOSIS — I517 Cardiomegaly: Secondary | ICD-10-CM | POA: Diagnosis not present

## 2021-04-23 DIAGNOSIS — J9811 Atelectasis: Secondary | ICD-10-CM | POA: Diagnosis not present

## 2021-04-23 DIAGNOSIS — R7989 Other specified abnormal findings of blood chemistry: Secondary | ICD-10-CM

## 2021-04-23 DIAGNOSIS — R0789 Other chest pain: Secondary | ICD-10-CM | POA: Diagnosis not present

## 2021-04-23 DIAGNOSIS — R079 Chest pain, unspecified: Secondary | ICD-10-CM | POA: Diagnosis not present

## 2021-04-23 LAB — CBC
HCT: 42.9 % (ref 39.0–52.0)
Hemoglobin: 14.3 g/dL (ref 13.0–17.0)
MCH: 30.9 pg (ref 26.0–34.0)
MCHC: 33.3 g/dL (ref 30.0–36.0)
MCV: 92.7 fL (ref 80.0–100.0)
Platelets: 213 10*3/uL (ref 150–400)
RBC: 4.63 MIL/uL (ref 4.22–5.81)
RDW: 13.1 % (ref 11.5–15.5)
WBC: 11.7 10*3/uL — ABNORMAL HIGH (ref 4.0–10.5)
nRBC: 0 % (ref 0.0–0.2)

## 2021-04-23 LAB — RESP PANEL BY RT-PCR (FLU A&B, COVID) ARPGX2
Influenza A by PCR: NEGATIVE
Influenza B by PCR: NEGATIVE
SARS Coronavirus 2 by RT PCR: NEGATIVE

## 2021-04-23 LAB — BASIC METABOLIC PANEL
Anion gap: 11 (ref 5–15)
BUN: 17 mg/dL (ref 8–23)
CO2: 26 mmol/L (ref 22–32)
Calcium: 9.7 mg/dL (ref 8.9–10.3)
Chloride: 99 mmol/L (ref 98–111)
Creatinine, Ser: 0.87 mg/dL (ref 0.61–1.24)
GFR, Estimated: >60 mL/min (ref >60)
Glucose, Bld: 113 mg/dL — ABNORMAL HIGH (ref 70–99)
Potassium: 3.6 mmol/L (ref 3.5–5.1)
Sodium: 136 mmol/L (ref 135–145)

## 2021-04-23 LAB — TROPONIN I (HIGH SENSITIVITY)
Troponin I (High Sensitivity): 20 ng/L — ABNORMAL HIGH (ref <18)
Troponin I (High Sensitivity): 18 ng/L — ABNORMAL HIGH (ref <18)

## 2021-04-23 MED ORDER — ONDANSETRON HCL 4 MG PO TABS: 4.0000 mg | ORAL_TABLET | Freq: Four times a day (QID) | ORAL | Status: DC | PRN

## 2021-04-23 MED ORDER — ACETAMINOPHEN 325 MG PO TABS: 650.0000 mg | ORAL_TABLET | Freq: Four times a day (QID) | ORAL | Status: DC | PRN

## 2021-04-23 MED ORDER — ACETAMINOPHEN 650 MG RE SUPP: 650.0000 mg | Freq: Four times a day (QID) | RECTAL | Status: DC | PRN

## 2021-04-23 MED ORDER — ONDANSETRON HCL 4 MG/2ML IJ SOLN: 4.0000 mg | Freq: Four times a day (QID) | INTRAMUSCULAR | Status: DC | PRN

## 2021-04-23 MED ORDER — NITROGLYCERIN 0.4 MG SL SUBL
0.4000 mg | SUBLINGUAL_TABLET | SUBLINGUAL | Status: DC | PRN
  Administered 2021-04-23 (×3): 0.4 mg via SUBLINGUAL
  Filled 2021-04-23: qty 1

## 2021-04-23 MED ORDER — ENOXAPARIN SODIUM 40 MG/0.4ML IJ SOSY
40.0000 mg | PREFILLED_SYRINGE | INTRAMUSCULAR | Status: DC
  Administered 2021-04-24 – 2021-04-25 (×2): 40 mg via SUBCUTANEOUS
  Filled 2021-04-23 (×2): qty 0.4

## 2021-04-23 MED ORDER — ASPIRIN 81 MG PO CHEW
324.0000 mg | CHEWABLE_TABLET | Freq: Once | ORAL | Status: DC
  Filled 2021-04-23: qty 4

## 2021-04-23 MED ORDER — IOHEXOL 350 MG/ML SOLN
100.0000 mL | Freq: Once | INTRAVENOUS | Status: AC | PRN
  Administered 2021-04-23: 100 mL via INTRAVENOUS

## 2021-04-23 NOTE — Assessment & Plan Note (Signed)
Continue statin. 

## 2021-04-23 NOTE — ED Notes (Signed)
Attempted to call report, RN unavailable, will call back. 

## 2021-04-23 NOTE — Assessment & Plan Note (Signed)
Continue CPAP.

## 2021-04-23 NOTE — Assessment & Plan Note (Signed)
Continue losartan and HCTZ.

## 2021-04-23 NOTE — Consult Note (Addendum)
Cardiology Consultation:   Patient ID: Christian [name] MRN: [id number]; DOB: 19-Aug-1945  Admit date: 04/23/2021 Date of Consult: 04/23/2021  PCP:  Mosie Lukes, MD   [facility] Providers Cardiologist:  None        Patient Profile:   Christian [name] is a 75 y.o. male with a hx of essential hypertension and OSA on CPAP who is being seen 04/23/2021 for the evaluation of chest pain at the request of Dr. Bridgett Larsson.  History of Present Illness:   [name] has no history of cardiovascular disease beyond well-controlled hypertension.  He has no significant family history of cardiovascular disease.  Overall, [name] has been doing quite well recently.  He walks his dog for approximately 15 minutes 5-6 times per day.  This walk often involves going up and down hills and he has never had any chest pain or shortness of breath with this.  He has not noticed any decline in his functional capacity over the last couple years.  He also frequently is out in the yard digging for his wife's garden, and cuts the grass with both a push and riding lawnmower similarly with no functional limitations.  Around 7 AM this morning he woke up with what he describes as a pulling sensation across his upper chest.  He thought he might of pulled a muscle and tried taking a hot shower.  This did not provide any relief.  There was no associated shortness of breath, palpitations, nausea, diaphoresis, arm pain, or jaw pain.  Nothing seemed to make the pain better or worse.  It persisted for about a total of 2 hours and with time seemed to slowly resolve.  Given this acute change however he decided to come to the emergency department for evaluation.  On arrival to the emergency department he was hemodynamically stable.  He had a CT PE performed which was negative for PE.  He was in sinus rhythm with first-degree AV block and no ischemic changes on his EKG.  His initial troponin was 20 which trended down to 18.  Given this change  it was recommended he be admitted for observation and further evaluation.   Past Medical History:  Diagnosis Date   Back pain 06/04/2015   DVT of lower limb, acute ([place]) 01/13/2015   High blood pressure    High cholesterol    History of chicken pox 01/13/2015   Hyperlipidemia, mild 01/13/2015   Hypokalemia 01/13/2015   Obesity    Verrucous skin lesion 02/08/2016    Past Surgical History:  Procedure Laterality Date   EYE SURGERY Bilateral 02/2019   Dr Talbert Forest     Home Medications:  Prior to Admission medications   Medication Sig Start Date End Date Taking? Authorizing Provider  hydrochlorothiazide (HYDRODIURIL) 25 MG tablet TAKE 1 TABLET BY MOUTH EVERY DAY 02/05/21  Yes Mosie Lukes, MD  losartan (COZAAR) 100 MG tablet TAKE 1 TABLET BY MOUTH EVERY DAY 03/15/21  Yes Mosie Lukes, MD  Multiple Vitamin (MULTIVITAMIN) tablet Take 1 tablet by mouth daily.   Yes [provider]  pravastatin (PRAVACHOL) 40 MG tablet TAKE 1 TABLET BY MOUTH EVERY DAY 02/08/21  Yes Mosie Lukes, MD  cyclobenzaprine (FLEXERIL) 5 MG tablet Take 0.5-1 tablets (2.5-5 mg total) by mouth 2 (two) times daily as needed for muscle spasms. 03/01/21   Mosie Lukes, MD    Inpatient Medications: Scheduled Meds:  aspirin  324 mg Oral Once   [START ON 04/24/2021] enoxaparin (  LOVENOX) injection  40 mg Subcutaneous Q24H   Continuous Infusions:  PRN Meds: acetaminophen **OR** acetaminophen, nitroGLYCERIN, ondansetron **OR** ondansetron (ZOFRAN) IV  Allergies:    Allergies  Allergen Reactions   Codeine Anaphylaxis    Social History:   Social History   Socioeconomic History   Marital status: Married    Spouse name: Not on file   Number of children: 1   Years of education: Not on file   Highest education level: Not on file  Occupational History   Occupation: Retired  Tobacco Use   Smoking status: Never   Smokeless tobacco: Never  Substance and Sexual Activity   Alcohol use: Yes    Alcohol/week:  0.0 standard drinks    Comment: 2-3 glasses of wine every night   Drug use: No   Sexual activity: Yes    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, retired from Web [employer], no dietary restrictions  Other Topics Concern   Not on file  Social History Narrative   Not on file   Social Determinants of Health   Financial Resource Strain: Not on file  Food Insecurity: Not on file  Transportation Needs: Not on file  Physical Activity: Not on file  Stress: Not on file  Social Connections: Not on file  Intimate Partner Violence: Not on file    Family History:    Family History  Problem Relation Age of Onset   Stroke Father        from surgery   Emphysema Father        smoker   Heart disease Father        carotid artery disease, vasculopathy   Allergic Disorder Sister      ROS:  Please see the history of present illness.   All other ROS reviewed and negative.     Physical Exam/Data:   Vitals:   04/23/21 2100 04/23/21 2130 04/23/21 2309 04/23/21 2311  BP: (!) 145/74 (!) 150/66 (!) 153/74   Pulse: 86 87 92   Resp: (!) _0 Temp:   98.9 F (37.2 C)   TempSrc:   Oral   SpO2: 95% 95% 95% 95%  Weight:      Height:       No intake or output data in the 24 hours ending 04/23/21 2354 Last 3 Weights 04/23/2021 04/23/2021 08/22/2020  Weight (lbs) 234 lb 2.1 oz 235 lb 230 lb  Weight (kg) 106.2 kg 106.595 kg 104.327 kg     Body mass index is 35.08 kg/m.  General:  Well nourished, well developed, in no acute distress HEENT: normal Neck: no JVD Vascular: No carotid bruits; Distal pulses 2+ bilaterally Cardiac:  normal S1, S2; RRR; no murmur  Lungs:  clear to auscultation bilaterally, no wheezing, rhonchi or rales  Abd: soft, nontender, no hepatomegaly  Ext: no edema Musculoskeletal:  No deformities, BUE and BLE strength normal and equal Skin: warm and dry  Neuro:  CNs 2-12 intact, no focal abnormalities noted Psych:  Normal affect   EKG:  The EKG was personally  reviewed and demonstrates:  Sinus rhythm, 1st degree AVB, no ischemic changes  Relevant CV Studies: No recent studies  Laboratory Data:  High Sensitivity Troponin:   Recent Labs  Lab 04/23/21 1325 04/23/21 1512  TROPONINIHS 20* 18*     Chemistry Recent Labs  Lab 04/23/21 1325  NA 136  K 3.6  CL 99  CO2 26  GLUCOSE 113*  BUN 17  CREATININE 0.87  CALCIUM 9.7  GFRNONAA >60  ANIONGAP 11    No results for input(s): PROT, ALBUMIN, AST, ALT, ALKPHOS, BILITOT in the last 168 hours. Lipids No results for input(s): CHOL, TRIG, HDL, LABVLDL, LDLCALC, CHOLHDL in the last 168 hours.  Hematology Recent Labs  Lab 04/23/21 1325  WBC 11.7*  RBC 4.63  HGB 14.3  HCT 42.9  MCV 92.7  MCH 30.9  MCHC 33.3  RDW 13.1  PLT 213   Thyroid No results for input(s): TSH, FREET4 in the last 168 hours.  BNPNo results for input(s): BNP, PROBNP in the last 168 hours.  DDimer No results for input(s): DDIMER in the last 168 hours.   Radiology/Studies:  DG Chest 2 View  Result Date: 04/23/2021 CLINICAL DATA:  Chest pain EXAM: CHEST - 2 VIEW COMPARISON:  None. FINDINGS: Cardiomegaly. Both lungs are clear. Disc degenerative disease of the thoracic spine. IMPRESSION: Cardiomegaly without acute abnormality of the lungs. Electronically Signed   By: Delanna Ahmadi M.D.   On: 04/23/2021 13:25   CT Angio Chest PE W/Cm &/Or Wo Cm  Result Date: 04/23/2021 CLINICAL DATA:  Chest pressure. Pulmonary embolism (PE) suspected, high prob EXAM: CT ANGIOGRAPHY CHEST WITH CONTRAST TECHNIQUE: Multidetector CT imaging of the chest was performed using the standard protocol during bolus administration of intravenous contrast. Multiplanar CT image reconstructions and MIPs were obtained to evaluate the vascular anatomy. CONTRAST:  152m OMNIPAQUE IOHEXOL 350 MG/ML SOLN COMPARISON:  Same day chest x-ray FINDINGS: Cardiovascular: Satisfactory opacification of the pulmonary arteries to the segmental level. Evaluation within  the lung bases is slightly degraded by respiratory motion artifact. Within this limitation there is no evidence of pulmonary embolism. Mid ascending thoracic aorta measures 4.0 cm in diameter. Atherosclerotic calcifications of the aorta and coronary arteries. Mild cardiomegaly. No pericardial effusion. Mediastinum/Nodes: No enlarged mediastinal, hilar, or axillary lymph nodes. Thyroid gland, trachea, and esophagus demonstrate no significant findings. Lungs/Pleura: Bilateral dependent opacities, likely passive atelectasis. Elsewhere, lungs are clear. No pleural effusion or pneumothorax. Upper Abdomen: Diffusely decreased attenuation of the hepatic parenchyma compatible with hepatic steatosis. No acute findings within the included upper abdomen. 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 No acute osseous abnormality. No chest wall abnormality. Review of the MIP images confirms the above findings. IMPRESSION: 1. No evidence of pulmonary embolism. 2. Dependent atelectasis bilaterally. 3. 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 Recommend annual imaging followup by CTA or MRA. This recommendation follows 2010 ACCF/AHA/AATS/ACR/ASA/SCA/SCAI/SIR/STS/SVM Guidelines for the Diagnosis and Management of Patients with Thoracic Aortic Disease. Circulation. 2010; 121:: P710-G269 Aortic aneurysm NOS (ICD10-I71.9) 4. Hepatic steatosis. 5. Aortic and coronary artery atherosclerosis (ICD10-I70.0). Electronically Signed   By: NDavina PokeD.O.   On: 04/23/2021 15:27     Assessment and Plan:   Mr. MFillerpresents to the emergency department this morning with 2 hours of chest discomfort that I would describe as possibly cardiac in nature (formerly atypical).  It is very reassuring that he sometimes walks almost 100 minutes a day with no symptoms and no recent change in his functional capacity.   However given his age and history of hypertension as well as the acuity of his symptoms, it does seem reasonable to further investigate this with either functional or anatomic testing.  Given he is quite functional we could certainly have him walk on a treadmill and perform imaging either with an echocardiogram or nuclear imaging.  Alternatively a CTA would be very informative.  His blood  pressure is elevated today and has been borderline in clinic previously.  I think it would be reasonable to add a third agent given that he has essentially maxed out on 2 agents currently.   Recommendations: - Nuc/echo treadmill vs CTA tomorrow (day team to give final recommendation) - Add amlodipine 5 mg qd   Risk Assessment/Risk Scores:  HEAR Score (for undifferentiated chest pain):  HEAR Score: 3{  For questions or updates, please contact [place] Please consult [email or web address] for contact info under    Signed, Ronaldo Miyamoto, MD  04/23/2021 11:54 PM

## 2021-04-23 NOTE — Subjective & Objective (Addendum)
CC: Chest pressure HPI: 75 year old male with a history of hypertension, OSA on CPAP, obesity with no prior cardiac history presents to the ER for chest pressure that started about 7 AM today.  He states that at 1 AM he let out his dog to use the bathroom.  When he came inside he did not have any chest pressure.  When he woke up this morning around 7 AM, he noticed some chest soreness/tightness.  Noticeable when he took a deep breath.  He states that he took a hot shower which did not relieve anything.  He continued on with his morning.  His chest tightness did not get any better.  He was brought to the ER for evaluation.  Work-up in the ER with a initial troponin of 20 with a subsequent troponin of 18.  CBC and chemistry are unremarkable.  COVID test was negative.  CTPA was negative for PE.  No pneumonia.  EKG showed normal sinus rhythm.  Prolonged QTC of 511 ms.  First-degree AV block with a PR interval of 236 ms.  No acute ST changes.  EDP discussed the case with Dr. Davina Poke with cardiology who recommended the patient be transferred to [facility] for cardiac evaluation.  Patient states that he walks his dog for 5 times a day.  He usually walks on an incline hill near his house.  He states that his exercise activity watch usually records at least 2 to 3 miles a day.  At no point during his exercise does he have any chest tightness, shortness of breath or dyspnea on exertion.  Definitely did not feel any chest soreness like he did today while he exercises.

## 2021-04-23 NOTE — ED Provider Notes (Signed)
  Physical Exam  BP 133/67   Pulse 90   Temp 98.5 F (36.9 C) (Oral)   Resp (!) 21   Ht 5' 8.5" (1.74 m)   Wt 106.2 kg   SpO2 95%   BMI 35.08 kg/m   Physical Exam  ED Course/Procedures     Procedures  MDM  Received patient in signout.  Chest pain.  Woke with this morning.  Initial hypotension improved.  Has a history of hypertension.  Pain in the anterior chest.  EKG has some nonspecific ST changes.  No known cardiac history but last stress test was probably 20 years ago.  Troponins are elevated but have been stable.  With continued pain and elevated troponins I feel as if you benefit from admission to hospital.  Heart pathway score was 6, that with elevated troponin would recommend to the hospital.  Discussed with Dr. Davina Poke from cardiology.  Recommends medicine admission and they can consult.  Has a history of DVTs.  Negative CTA.       Davonna Belling, MD 04/23/21 [phone]

## 2021-04-23 NOTE — Assessment & Plan Note (Signed)
Chronic. 

## 2021-04-23 NOTE — ACP (Advance Care Planning) (Signed)
  Advance Care Planning  Reason for Advance Care Planning Conversation: Acute hospitalization Principal Problem:   Chest pressure Active Problems:   Obstructive sleep apnea   Hyperlipidemia, mild   Obesity   Benign essential HTN    I discussed with patient about advance care planning. Specifically, we discussed whether patient would desire cardiopulmonary resuscitation (CPR) in the event of acute cardiopulmonary arrest. We also discussed whether endotracheal intubation and temporary ventilator life support would be desired in the event of acute cardio- or pulmonary decompensation.   Code status order of Full Code has been entered in accord with the patient's wishes. with intubation  Living will: yes  [place] / Davonna Belling              Name: Christian [name]: Relationship to Patient: wife   Is agent appointed in legal document yes  Time spent today in ACP discussion was  5 mins  Kristopher Oppenheim, DO [facility]

## 2021-04-23 NOTE — Discharge Instructions (Addendum)
Schedule appointment to follow-up with cardiology for further evaluation.

## 2021-04-23 NOTE — Assessment & Plan Note (Signed)
Observation telemetry bed.  Patient gives no clinical or historical signs of any obstructive disease.  Patient is able to walk 3 miles a day with his 60 pound dog up a hill without any chest symptoms at all.  His initial troponins are very reassuring.  We will consult cardiology per EDP plan.  Patient was given aspirin in the ER.

## 2021-04-23 NOTE — H&P (Signed)
History and Physical    Christian [name] YYQ:[id number] DOB: Aug 25, 1945 DOA: 04/23/2021  PCP: Mosie Lukes, MD   Patient coming from: Home  I have personally briefly reviewed patient's old medical records in [place]  CC: Chest pressure HPI: 75 year old male with a history of hypertension, OSA on CPAP, obesity with no prior cardiac history presents to the ER for chest pressure that started about 7 AM today.  He states that at 1 AM he let out his dog to use the bathroom.  When he came inside he did not have any chest pressure.  When he woke up this morning around 7 AM, he noticed some chest soreness/tightness.  Noticeable when he took a deep breath.  He states that he took a hot shower which did not relieve anything.  He continued on with his morning.  His chest tightness did not get any better.  He was brought to the ER for evaluation.  Work-up in the ER with a initial troponin of 20 with a subsequent troponin of 18.  CBC and chemistry are unremarkable.  COVID test was negative.  CTPA was negative for PE.  No pneumonia.  EKG showed normal sinus rhythm.  Prolonged QTC of 511 ms.  First-degree AV block with a PR interval of 236 ms.  No acute ST changes.  EDP discussed the case with Dr. Davina Poke with cardiology who recommended the patient be transferred to [facility] for cardiac evaluation.  Patient states that he walks his dog for 5 times a day.  He usually walks on an incline hill near his house.  He states that his exercise activity watch usually records at least 2 to 3 miles a day.  At no point during his exercise does he have any chest tightness, shortness of breath or dyspnea on exertion.  Definitely did not feel any chest soreness like he did today while he exercises.   ED Course: troponin flat. Barely positive. CTPA negative. EDP discussed case with cardiology who wanted pt to be transferred to [facility].  Review of Systems:  Review of Systems  Constitutional: Negative.   HENT:  Negative.    Eyes: Negative.   Respiratory: Negative.  Negative for cough, hemoptysis, sputum production, shortness of breath and wheezing.   Cardiovascular:  Negative for palpitations, orthopnea, leg swelling and PND.       Central chest soreness with some radiation to the neck but not down the arms.  Worse with deep breathing.  Nothing made the chest soreness any better.  No diaphoresis, nausea, vomiting.  Gastrointestinal: Negative.  Negative for nausea and vomiting.  Genitourinary: Negative.   Musculoskeletal: Negative.   Skin: Negative.   Neurological: Negative.   Endo/Heme/Allergies: Negative.   Psychiatric/Behavioral: Negative.    All other systems reviewed and are negative.  Past Medical History:  Diagnosis Date   Back pain 06/04/2015   DVT of lower limb, acute ([place]) 01/13/2015   High blood pressure    High cholesterol    History of chicken pox 01/13/2015   Hyperlipidemia, mild 01/13/2015   Hypokalemia 01/13/2015   Obesity    Verrucous skin lesion 02/08/2016    Past Surgical History:  Procedure Laterality Date   EYE SURGERY Bilateral 02/2019   Dr Talbert Forest     reports that he has never smoked. He has never used smokeless tobacco. He reports current alcohol use. He reports that he does not use drugs.  Allergies  Allergen Reactions   Codeine Anaphylaxis    Family  History  Problem Relation Age of Onset   Stroke Father        from surgery   Emphysema Father        smoker   Heart disease Father        carotid artery disease, vasculopathy   Allergic Disorder Sister     Prior to Admission medications   Medication Sig Start Date End Date Taking? Authorizing Provider  hydrochlorothiazide (HYDRODIURIL) 25 MG tablet TAKE 1 TABLET BY MOUTH EVERY DAY 02/05/21  Yes Mosie Lukes, MD  losartan (COZAAR) 100 MG tablet TAKE 1 TABLET BY MOUTH EVERY DAY 03/15/21  Yes Mosie Lukes, MD  Multiple Vitamin (MULTIVITAMIN) tablet Take 1 tablet by mouth daily.   Yes [provider]   pravastatin (PRAVACHOL) 40 MG tablet TAKE 1 TABLET BY MOUTH EVERY DAY 02/08/21  Yes Mosie Lukes, MD  cyclobenzaprine (FLEXERIL) 5 MG tablet Take 0.5-1 tablets (2.5-5 mg total) by mouth 2 (two) times daily as needed for muscle spasms. 03/01/21   Mosie Lukes, MD    Physical Exam: Vitals:   04/23/21 2100 04/23/21 2130 04/23/21 2309 04/23/21 2311  BP: (!) 145/74 (!) 150/66 (!) 153/74   Pulse: 86 87 92   Resp: (!) _0 Temp:   98.9 F (37.2 C)   TempSrc:   Oral   SpO2: 95% 95% 95% 95%  Weight:      Height:        Physical Exam Vitals and nursing note reviewed.  Constitutional:      General: He is not in acute distress.    Appearance: Normal appearance. He is obese. He is not ill-appearing, toxic-appearing or diaphoretic.  HENT:     Head: Normocephalic and atraumatic.     Nose: Nose normal. No rhinorrhea.  Eyes:     General: No scleral icterus.       Right eye: No discharge.        Left eye: No discharge.     Pupils: Pupils are equal, round, and reactive to light.  Cardiovascular:     Rate and Rhythm: Normal rate and regular rhythm.     Pulses: Normal pulses.     Heart sounds: No murmur heard. Pulmonary:     Effort: Pulmonary effort is normal. No respiratory distress.     Breath sounds: Normal breath sounds. No wheezing or rales.  Abdominal:     General: Bowel sounds are normal. There is no distension.     Tenderness: There is no abdominal tenderness. There is no guarding or rebound.  Musculoskeletal:     Right lower leg: No edema.     Left lower leg: No edema.  Skin:    General: Skin is warm and dry.     Capillary Refill: Capillary refill takes less than 2 seconds.  Neurological:     General: No focal deficit present.     Mental Status: He is alert and oriented to person, place, and time.     Labs on Admission: I have personally reviewed following labs and imaging studies  CBC: Recent Labs  Lab 04/23/21 1325  WBC 11.7*  HGB 14.3  HCT 42.9  MCV  92.7  PLT 638   Basic Metabolic Panel: Recent Labs  Lab 04/23/21 1325  NA 136  K 3.6  CL 99  CO2 26  GLUCOSE 113*  BUN 17  CREATININE 0.87  CALCIUM 9.7   GFR: Estimated Creatinine Clearance: 86 mL/min (by C-G formula based on  SCr of 0.87 mg/dL). Liver Function Tests: No results for input(s): AST, ALT, ALKPHOS, BILITOT, PROT, ALBUMIN in the last 168 hours. No results for input(s): LIPASE, AMYLASE in the last 168 hours. No results for input(s): AMMONIA in the last 168 hours. Coagulation Profile: No results for input(s): INR, PROTIME in the last 168 hours. Cardiac Enzymes: No results for input(s): CKTOTAL, CKMB, CKMBINDEX, TROPONINI in the last 168 hours. BNP (last 3 results) No results for input(s): PROBNP in the last 8760 hours. HbA1C: No results for input(s): HGBA1C in the last 72 hours. CBG: No results for input(s): GLUCAP in the last 168 hours. Lipid Profile: No results for input(s): CHOL, HDL, LDLCALC, TRIG, CHOLHDL, LDLDIRECT in the last 72 hours. Thyroid Function Tests: No results for input(s): TSH, T4TOTAL, FREET4, T3FREE, THYROIDAB in the last 72 hours. Anemia Panel: No results for input(s): VITAMINB12, FOLATE, FERRITIN, TIBC, IRON, RETICCTPCT in the last 72 hours. Urine analysis:    Component Value Date/Time   COLORURINE YELLOW 05/29/2015 1136   APPEARANCEUR CLEAR 05/29/2015 1136   LABSPEC 1.015 05/29/2015 1136   PHURINE 7.0 05/29/2015 1136   GLUCOSEU NEGATIVE 05/29/2015 1136   HGBUR NEGATIVE 05/29/2015 1136   BILIRUBINUR NEGATIVE 05/29/2015 1136   KETONESUR NEGATIVE 05/29/2015 1136   UROBILINOGEN 0.2 05/29/2015 1136   NITRITE NEGATIVE 05/29/2015 1136   LEUKOCYTESUR NEGATIVE 05/29/2015 1136    Radiological Exams on Admission: I have personally reviewed images DG Chest 2 View  Result Date: 04/23/2021 CLINICAL DATA:  Chest pain EXAM: CHEST - 2 VIEW COMPARISON:  None. FINDINGS: Cardiomegaly. Both lungs are clear. Disc degenerative disease of the thoracic  spine. IMPRESSION: Cardiomegaly without acute abnormality of the lungs. Electronically Signed   By: Delanna Ahmadi M.D.   On: 04/23/2021 13:25   CT Angio Chest PE W/Cm &/Or Wo Cm  Result Date: 04/23/2021 CLINICAL DATA:  Chest pressure. Pulmonary embolism (PE) suspected, high prob EXAM: CT ANGIOGRAPHY CHEST WITH CONTRAST TECHNIQUE: Multidetector CT imaging of the chest was performed using the standard protocol during bolus administration of intravenous contrast. Multiplanar CT image reconstructions and MIPs were obtained to evaluate the vascular anatomy. CONTRAST:  153m OMNIPAQUE IOHEXOL 350 MG/ML SOLN COMPARISON:  Same day chest x-ray FINDINGS: Cardiovascular: Satisfactory opacification of the pulmonary arteries to the segmental level. Evaluation within the lung bases is slightly degraded by respiratory motion artifact. Within this limitation there is no evidence of pulmonary embolism. Mid ascending thoracic aorta measures 4.0 cm in diameter. Atherosclerotic calcifications of the aorta and coronary arteries. Mild cardiomegaly. No pericardial effusion. Mediastinum/Nodes: No enlarged mediastinal, hilar, or axillary lymph nodes. Thyroid gland, trachea, and esophagus demonstrate no significant findings. Lungs/Pleura: Bilateral dependent opacities, likely passive atelectasis. Elsewhere, lungs are clear. No pleural effusion or pneumothorax. Upper Abdomen: Diffusely decreased attenuation of the hepatic parenchyma compatible with hepatic steatosis. No acute findings within the included upper abdomen. 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 No acute osseous abnormality. No chest wall abnormality. Review of the MIP images confirms the above findings. IMPRESSION: 1. No evidence of pulmonary embolism. 2. Dependent atelectasis bilaterally. 3. 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  Recommend annual imaging followup by CTA or MRA. This recommendation follows 2010 ACCF/AHA/AATS/ACR/ASA/SCA/SCAI/SIR/STS/SVM Guidelines for the Diagnosis and Management of Patients with Thoracic Aortic Disease. Circulation. 2010; 121:: [id number] Aortic aneurysm NOS (ICD10-I71.9) 4. Hepatic steatosis. 5. Aortic and coronary artery atherosclerosis (ICD10-I70.0). Electronically Signed   By: NDavina PokeD.O.   On: 04/23/2021  15:27    EKG: I have personally reviewed EKG: NSR, prolonged QTC, 1st degree AVB     Assessment/Plan Principal Problem:   Chest pressure Active Problems:   Obstructive sleep apnea   Hyperlipidemia, mild   Obesity   Benign essential HTN    Chest pressure Observation telemetry bed.  Patient gives no clinical or historical signs of any obstructive disease.  Patient is able to walk 3 miles a day with his 60 pound dog up a hill without any chest symptoms at all.  His initial troponins are very reassuring.  We will consult cardiology per EDP plan.  Patient was given aspirin in the ER.  Obstructive sleep apnea Continue CPAP.  Hyperlipidemia, mild Continue statin.  Obesity Chronic.  Benign essential HTN Continue losartan and HCTZ.  DVT prophylaxis: Lovenox Code Status: Full Code Family Communication: no family at bedside  Disposition Plan: return home  Consults called: cardiology  Admission status: Observation, Telemetry bed   Kristopher Oppenheim, DO [facility] 04/23/2021, 11:24 PM

## 2021-04-23 NOTE — Progress Notes (Signed)
Received a phone call from Facility: [facility]  Requesting MD: Dr. Alvino Chapel  Patient with h/o OSA, DVT in remote past, HLD, HTN presenting with chest pain.   No family history of CAD. Stress test 20 years ago. He does not smoke.   Ekg with rate of 85 sinus or ectopic atrial rhythm. No prior ekg.  Troponin 20>18. Heart score of 6. Discussed with cardiology, Dr. Audie Box, who recommended observation and consult cards when arrives.   Plan of care: observation to cardiac telemetry. Call cardiology when arrives.   The patient will be accepted for admission to telemetry at [facility] when bed is available.   Nursing staff, Please call the [place] number at the top of Amion at the time of the patient's arrival so that the patient can be paged to the admitting physician.   Casimer Bilis M.D. [facility]

## 2021-04-23 NOTE — ED Provider Notes (Signed)
[facility] EMERGENCY DEPARTMENT Provider Note   CSN: [id number] Arrival date & time: 04/23/21  1241     History Chief Complaint  Patient presents with   Chest Pain    Christian [name] is a 75 y.o. male.  Patient was awake at 1 in the morning had no discomfort.  When he woke at 7:30 in the morning he had upper sternal pain.  That radiates to the base of the neck.  Feeling like a tightness.  Worse with deep breath not worse with movement.  No shortness of breath no nausea or vomiting.  Never had anything like this before.  No history of coronary artery disease.  Does have a history of a DVT of the lower limb acute in 2016 and high cholesterol and hyperlipidemia and high blood pressure.  Medications include hydrochlorothiazide and Cozaar.      Past Medical History:  Diagnosis Date   Back pain 06/04/2015   DVT of lower limb, acute ([place]) 01/13/2015   High blood pressure    High cholesterol    History of chicken pox 01/13/2015   Hyperlipidemia, mild 01/13/2015   Hypokalemia 01/13/2015   Obesity    Verrucous skin lesion 02/08/2016    Patient Active Problem List   Diagnosis Date Noted   Right hip pain 03/04/2021   Cataract 11/20/2017   Right shoulder pain 05/20/2017   Diverticulosis 05/20/2017   Preventative health care 05/20/2017   Verrucous skin lesion 02/08/2016   Back pain 06/04/2015   Diarrhea 05/06/2015   Nonallergic rhinitis 02/10/2015   Sun-damaged skin 01/29/2015   Benign essential HTN 01/29/2015   Hyperglycemia 01/29/2015   DVT of lower limb, acute ([place]) 01/13/2015   Hyperlipidemia, mild 01/13/2015   History of chicken pox 01/13/2015   Hypokalemia 01/13/2015   Obesity    Obstructive sleep apnea 06/10/2014    Past Surgical History:  Procedure Laterality Date   EYE SURGERY Bilateral 02/2019   Dr Talbert Forest       Family History  Problem Relation Age of Onset   Stroke Father        from surgery   Emphysema Father        smoker   Heart disease Father         carotid artery disease, vasculopathy   Allergic Disorder Sister     Social History   Tobacco Use   Smoking status: Never   Smokeless tobacco: Never  Substance Use Topics   Alcohol use: Yes    Alcohol/week: 0.0 standard drinks    Comment: 2-3 glasses of wine every night   Drug use: No    Home Medications Prior to Admission medications   Medication Sig Start Date End Date Taking? Authorizing Provider  hydrochlorothiazide (HYDRODIURIL) 25 MG tablet TAKE 1 TABLET BY MOUTH EVERY DAY 02/05/21  Yes Mosie Lukes, MD  losartan (COZAAR) 100 MG tablet TAKE 1 TABLET BY MOUTH EVERY DAY 03/15/21  Yes Mosie Lukes, MD  Multiple Vitamin (MULTIVITAMIN) tablet Take 1 tablet by mouth daily.   Yes [provider]  pravastatin (PRAVACHOL) 40 MG tablet TAKE 1 TABLET BY MOUTH EVERY DAY 02/08/21  Yes Mosie Lukes, MD  cyclobenzaprine (FLEXERIL) 5 MG tablet Take 0.5-1 tablets (2.5-5 mg total) by mouth 2 (two) times daily as needed for muscle spasms. 03/01/21   Mosie Lukes, MD    Allergies    Codeine  Review of Systems   Review of Systems  Constitutional:  Negative for chills and fever.  HENT:  Negative for ear pain and sore throat.   Eyes:  Negative for pain and visual disturbance.  Respiratory:  Negative for cough and shortness of breath.   Cardiovascular:  Positive for chest pain. Negative for palpitations.  Gastrointestinal:  Negative for abdominal pain and vomiting.  Genitourinary:  Negative for dysuria and hematuria.  Musculoskeletal:  Negative for arthralgias and back pain.  Skin:  Negative for color change and rash.  Neurological:  Negative for seizures and syncope.  All other systems reviewed and are negative.  Physical Exam Updated Vital Signs BP (!) 142/87   Pulse 85   Temp 98.5 F (36.9 C) (Oral)   Resp (!) 23   Ht 1.74 m (5' 8.5")   Wt 106.2 kg   SpO2 96%   BMI 35.08 kg/m   Physical Exam Vitals and nursing note reviewed.  Constitutional:       General: He is not in acute distress.    Appearance: Normal appearance. He is well-developed.  HENT:     Head: Normocephalic and atraumatic.  Eyes:     Extraocular Movements: Extraocular movements intact.     Conjunctiva/sclera: Conjunctivae normal.     Pupils: Pupils are equal, round, and reactive to light.  Cardiovascular:     Rate and Rhythm: Normal rate and regular rhythm.     Heart sounds: No murmur heard. Pulmonary:     Effort: Pulmonary effort is normal. No respiratory distress.     Breath sounds: Normal breath sounds. No wheezing, rhonchi or rales.  Abdominal:     Palpations: Abdomen is soft.     Tenderness: There is no abdominal tenderness.  Musculoskeletal:        General: No swelling.     Cervical back: Normal range of motion and neck supple.     Right lower leg: No edema.     Left lower leg: No edema.  Skin:    General: Skin is warm and dry.     Capillary Refill: Capillary refill takes less than 2 seconds.  Neurological:     General: No focal deficit present.     Mental Status: He is alert and oriented to person, place, and time.  Psychiatric:        Mood and Affect: Mood normal.    ED Results / Procedures / Treatments   Labs (all labs ordered are listed, but only abnormal results are displayed) Labs Reviewed  BASIC METABOLIC PANEL - Abnormal; Notable for the following components:      Result Value   Glucose, Bld 113 (*)    All other components within normal limits  CBC - Abnormal; Notable for the following components:   WBC 11.7 (*)    All other components within normal limits  TROPONIN I (HIGH SENSITIVITY) - Abnormal; Notable for the following components:   Troponin I (High Sensitivity) 20 (*)    All other components within normal limits  RESP PANEL BY RT-PCR (FLU A&B, COVID) [id number]  TROPONIN I (HIGH SENSITIVITY)    EKG EKG Interpretation  Date/Time:  Monday April 23 2021 12:50:06 EST Ventricular Rate:  98 PR Interval:  218 QRS Duration: 78 QT  Interval:  324 QTC Calculation: 413 R Axis:   58 Text Interpretation: Sinus rhythm with 1st degree A-V block with Premature atrial complexes Nonspecific ST and T wave abnormality Abnormal ECG No previous ECGs available Confirmed by Fredia Sorrow [phone]) on 04/23/2021 12:57:11 PM  Radiology DG Chest 2 View  Result Date: 04/23/2021 CLINICAL DATA:  Chest pain EXAM: CHEST - 2 VIEW COMPARISON:  None. FINDINGS: Cardiomegaly. Both lungs are clear. Disc degenerative disease of the thoracic spine. IMPRESSION: Cardiomegaly without acute abnormality of the lungs. Electronically Signed   By: Delanna Ahmadi M.D.   On: 04/23/2021 13:25    Procedures Procedures   Medications Ordered in ED Medications - No data to display  ED Course  I have reviewed the triage vital signs and the nursing notes.  Pertinent labs & imaging results that were available during my care of the patient were reviewed by me and considered in my medical decision making (see chart for details).    MDM Rules/Calculators/A&P                           Patient's upper sternal chest pain possibly could be acute cardiac.  Also could perhaps represent PE.  Patient is a DVTs in the past.  No leg swelling.  Basic metabolic panel is normal CBC just has a mild leukocytosis with a white count of 11.7.  Initial troponin was 20 definitely needs delta troponin.  Chest x-ray had cardiomegaly without acute abnormality of the lungs.  Patient will need delta troponin.  We will get CT angio chest to rule out PE.  If there is significant change in the troponin patient will need to be admitted.  If it stays the same and this is probably not cardiac chest pain.  We will give baby aspirin here and some nitroglycerin to see what effect it has on the pain.  Patient's initial blood pressure here was elevated at 160/78 patient has a known history of hypertension.  Patient does have cardiac risk factors. Final Clinical Impression(s) / ED Diagnoses Final  diagnoses:  Precordial pain    Rx / DC Orders ED Discharge Orders     None        Fredia Sorrow, MD 04/23/21 1450

## 2021-04-23 NOTE — ED Triage Notes (Signed)
Chest tightness this am into his neck. Worse when he takes a deep breath. Dry cough. States he uses a cpap and has allergies. Ekg at triage.

## 2021-04-24 ENCOUNTER — Observation Stay (HOSPITAL_COMMUNITY): Payer: Medicare HMO

## 2021-04-24 ENCOUNTER — Observation Stay (HOSPITAL_BASED_OUTPATIENT_CLINIC_OR_DEPARTMENT_OTHER): Payer: Medicare HMO

## 2021-04-24 ENCOUNTER — Encounter (HOSPITAL_COMMUNITY): Payer: Self-pay | Admitting: Internal Medicine

## 2021-04-24 DIAGNOSIS — R0789 Other chest pain: Principal | ICD-10-CM

## 2021-04-24 DIAGNOSIS — R079 Chest pain, unspecified: Secondary | ICD-10-CM | POA: Diagnosis not present

## 2021-04-24 DIAGNOSIS — I10 Essential (primary) hypertension: Secondary | ICD-10-CM | POA: Diagnosis not present

## 2021-04-24 DIAGNOSIS — I700 Atherosclerosis of aorta: Secondary | ICD-10-CM | POA: Diagnosis not present

## 2021-04-24 DIAGNOSIS — R778 Other specified abnormalities of plasma proteins: Secondary | ICD-10-CM

## 2021-04-24 DIAGNOSIS — E785 Hyperlipidemia, unspecified: Secondary | ICD-10-CM | POA: Diagnosis not present

## 2021-04-24 LAB — COMPREHENSIVE METABOLIC PANEL
ALT: 26 U/L (ref 0–44)
AST: 23 U/L (ref 15–41)
Albumin: 3.5 g/dL (ref 3.5–5.0)
Alkaline Phosphatase: 61 U/L (ref 38–126)
Anion gap: 10 (ref 5–15)
BUN: 10 mg/dL (ref 8–23)
CO2: 24 mmol/L (ref 22–32)
Calcium: 9.1 mg/dL (ref 8.9–10.3)
Chloride: 100 mmol/L (ref 98–111)
Creatinine, Ser: 0.79 mg/dL (ref 0.61–1.24)
GFR, Estimated: >60 mL/min (ref >60)
Glucose, Bld: 136 mg/dL — ABNORMAL HIGH (ref 70–99)
Potassium: 3.3 mmol/L — ABNORMAL LOW (ref 3.5–5.1)
Sodium: 134 mmol/L — ABNORMAL LOW (ref 135–145)
Total Bilirubin: 0.8 mg/dL (ref 0.3–1.2)
Total Protein: 7 g/dL (ref 6.5–8.1)

## 2021-04-24 LAB — CBC WITH DIFFERENTIAL/PLATELET
Abs Immature Granulocytes: 0.03 10*3/uL (ref 0.00–0.07)
Basophils Absolute: 0.1 10*3/uL (ref 0.0–0.1)
Basophils Relative: 1 %
Eosinophils Absolute: 0.1 10*3/uL (ref 0.0–0.5)
Eosinophils Relative: 1 %
HCT: 41.4 % (ref 39.0–52.0)
Hemoglobin: 13.8 g/dL (ref 13.0–17.0)
Immature Granulocytes: 0 %
Lymphocytes Relative: 18 %
Lymphs Abs: 1.6 10*3/uL (ref 0.7–4.0)
MCH: 31 pg (ref 26.0–34.0)
MCHC: 33.3 g/dL (ref 30.0–36.0)
MCV: 93 fL (ref 80.0–100.0)
Monocytes Absolute: 1.3 10*3/uL — ABNORMAL HIGH (ref 0.1–1.0)
Monocytes Relative: 15 %
Neutro Abs: 5.9 10*3/uL (ref 1.7–7.7)
Neutrophils Relative %: 65 %
Platelets: 197 10*3/uL (ref 150–400)
RBC: 4.45 MIL/uL (ref 4.22–5.81)
RDW: 13 % (ref 11.5–15.5)
WBC: 9 10*3/uL (ref 4.0–10.5)
nRBC: 0 % (ref 0.0–0.2)

## 2021-04-24 LAB — LIPID PANEL
Cholesterol: 178 mg/dL (ref 0–200)
HDL: 52 mg/dL (ref >40)
LDL Cholesterol: 109 mg/dL — ABNORMAL HIGH (ref 0–99)
Total CHOL/HDL Ratio: 3.4 RATIO
Triglycerides: 86 mg/dL (ref <150)
VLDL: 17 mg/dL (ref 0–40)

## 2021-04-24 LAB — ECHOCARDIOGRAM COMPLETE
Area-P 1/2: 4.21 cm2
Height: 68.5 in
S' Lateral: 2.6 cm
Weight: 3650.82 oz

## 2021-04-24 LAB — TROPONIN I (HIGH SENSITIVITY): Troponin I (High Sensitivity): 24 ng/L — ABNORMAL HIGH (ref <18)

## 2021-04-24 MED ORDER — METOPROLOL TARTRATE 100 MG PO TABS
100.0000 mg | ORAL_TABLET | Freq: Once | ORAL | Status: AC
  Administered 2021-04-24: 100 mg via ORAL
  Filled 2021-04-24: qty 1

## 2021-04-24 MED ORDER — IOHEXOL 350 MG/ML SOLN
95.0000 mL | Freq: Once | INTRAVENOUS | Status: AC | PRN
  Administered 2021-04-24: 95 mL via INTRAVENOUS

## 2021-04-24 MED ORDER — ASPIRIN 81 MG PO CHEW
81.0000 mg | CHEWABLE_TABLET | Freq: Every day | ORAL | Status: DC
  Administered 2021-04-24 – 2021-04-25 (×2): 81 mg via ORAL
  Filled 2021-04-24 (×2): qty 1

## 2021-04-24 MED ORDER — NITROGLYCERIN 0.4 MG SL SUBL
SUBLINGUAL_TABLET | SUBLINGUAL | Status: AC
  Filled 2021-04-24: qty 2

## 2021-04-24 MED ORDER — METOPROLOL TARTRATE 5 MG/5ML IV SOLN
INTRAVENOUS | Status: AC
  Filled 2021-04-24: qty 5

## 2021-04-24 MED ORDER — POTASSIUM CHLORIDE CRYS ER 20 MEQ PO TBCR
40.0000 meq | EXTENDED_RELEASE_TABLET | Freq: Two times a day (BID) | ORAL | Status: AC
  Administered 2021-04-24 (×2): 40 meq via ORAL
  Filled 2021-04-24 (×2): qty 2

## 2021-04-24 MED ORDER — ATORVASTATIN CALCIUM 40 MG PO TABS
40.0000 mg | ORAL_TABLET | Freq: Every day | ORAL | Status: DC
  Administered 2021-04-24 – 2021-04-25 (×2): 40 mg via ORAL
  Filled 2021-04-24 (×2): qty 1

## 2021-04-24 NOTE — Progress Notes (Signed)
Cardiology Progress Note  Patient ID: Christian [name] MRN: [id number] DOB: 1945-06-14 Date of Encounter: 04/24/2021  Primary Cardiologist: None  Subjective   Chief Complaint: None.  HPI: Denies chest pain.  Admitted with tightness in his chest.  Occurred at rest.  He reports food improved his symptoms.  Troponins negative.  CT PE study negative.  Coronary CTA today.  ROS:  All other ROS reviewed and negative. Pertinent positives noted in the HPI.     Inpatient Medications  Scheduled Meds:  aspirin  324 mg Oral Once   enoxaparin (LOVENOX) injection  40 mg Subcutaneous Q24H   potassium chloride  40 mEq Oral BID   Continuous Infusions:  PRN Meds: acetaminophen **OR** acetaminophen, nitroGLYCERIN, ondansetron **OR** ondansetron (ZOFRAN) IV   Vital Signs   Vitals:   04/23/21 2309 04/23/21 2311 04/24/21 0343 04/24/21 0733  BP: (!) 153/74  (!) 150/79 (!) 149/73  Pulse: 92  71 80  Resp: _0 Temp: 98.9 F (37.2 C)  98.4 F (36.9 C) 98.2 F (36.8 C)  TempSrc: Oral  Oral Oral  SpO2: 95% 95% 97% 95%  Weight:   103.5 kg   Height:        Intake/Output Summary (Last 24 hours) at 04/24/2021 0850 Last data filed at 04/24/2021 0745 Gross per 24 hour  Intake 240 ml  Output 0 ml  Net 240 ml   Last 3 Weights 04/24/2021 04/23/2021 04/23/2021  Weight (lbs) 228 lb 2.8 oz 234 lb 2.1 oz 235 lb  Weight (kg) 103.5 kg 106.2 kg 106.595 kg      Telemetry  Overnight telemetry shows sinus rhythm in the 80s, which I personally reviewed.   ECG  The most recent ECG shows sinus rhythm heart rate 88, nonspecific ST-T changes, which I personally reviewed.   Physical Exam   Vitals:   04/23/21 2309 04/23/21 2311 04/24/21 0343 04/24/21 0733  BP: (!) 153/74  (!) 150/79 (!) 149/73  Pulse: 92  71 80  Resp: _1 Temp: 98.9 F (37.2 C)  98.4 F (36.9 C) 98.2 F (36.8 C)  TempSrc: Oral  Oral Oral  SpO2: 95% 95% 97% 95%  Weight:   103.5 kg   Height:        Intake/Output  Summary (Last 24 hours) at 04/24/2021 0850 Last data filed at 04/24/2021 0745 Gross per 24 hour  Intake 240 ml  Output 0 ml  Net 240 ml    Last 3 Weights 04/24/2021 04/23/2021 04/23/2021  Weight (lbs) 228 lb 2.8 oz 234 lb 2.1 oz 235 lb  Weight (kg) 103.5 kg 106.2 kg 106.595 kg    Body mass index is 34.19 kg/m.  General: Well nourished, well developed, in no acute distress Head: Atraumatic, normal size  Eyes: PEERLA, EOMI  Neck: Supple, no JVD Endocrine: No thryomegaly Cardiac: Normal S1, S2; RRR; no murmurs, rubs, or gallops Lungs: Clear to auscultation bilaterally, no wheezing, rhonchi or rales  Abd: Soft, nontender, no hepatomegaly  Ext: No edema, pulses 2+ Musculoskeletal: No deformities, BUE and BLE strength normal and equal Skin: Warm and dry, no rashes   Neuro: Alert and oriented to person, place, time, and situation, CNII-XII grossly intact, no focal deficits  Psych: Normal mood and affect   Labs  High Sensitivity Troponin:   Recent Labs  Lab 04/23/21 1325 04/23/21 1512 04/24/21 0510  TROPONINIHS 20* 18* 24*     Cardiac EnzymesNo results for input(s): TROPONINI in the last 168 hours. No  results for input(s): TROPIPOC in the last 168 hours.  Chemistry Recent Labs  Lab 04/23/21 1325 04/24/21 0129  NA 136 134*  K 3.6 3.3*  CL 99 100  CO2 26 24  GLUCOSE 113* 136*  BUN 17 10  CREATININE 0.87 0.79  CALCIUM 9.7 9.1  PROT  --  7.0  ALBUMIN  --  3.5  AST  --  23  ALT  --  26  ALKPHOS  --  61  BILITOT  --  0.8  GFRNONAA >60 >60  ANIONGAP 11 10    Hematology Recent Labs  Lab 04/23/21 1325 04/24/21 0129  WBC 11.7* 9.0  RBC 4.63 4.45  HGB 14.3 13.8  HCT 42.9 41.4  MCV 92.7 93.0  MCH 30.9 31.0  MCHC 33.3 33.3  RDW 13.1 13.0  PLT 213 197   BNPNo results for input(s): BNP, PROBNP in the last 168 hours.  DDimer No results for input(s): DDIMER in the last 168 hours.   Radiology  DG Chest 2 View  Result Date: 04/23/2021 CLINICAL DATA:  Chest pain  EXAM: CHEST - 2 VIEW COMPARISON:  None. FINDINGS: Cardiomegaly. Both lungs are clear. Disc degenerative disease of the thoracic spine. IMPRESSION: Cardiomegaly without acute abnormality of the lungs. Electronically Signed   By: Delanna Ahmadi M.D.   On: 04/23/2021 13:25   CT Angio Chest PE W/Cm &/Or Wo Cm  Result Date: 04/23/2021 CLINICAL DATA:  Chest pressure. Pulmonary embolism (PE) suspected, high prob EXAM: CT ANGIOGRAPHY CHEST WITH CONTRAST TECHNIQUE: Multidetector CT imaging of the chest was performed using the standard protocol during bolus administration of intravenous contrast. Multiplanar CT image reconstructions and MIPs were obtained to evaluate the vascular anatomy. CONTRAST:  139m OMNIPAQUE IOHEXOL 350 MG/ML SOLN COMPARISON:  Same day chest x-ray FINDINGS: Cardiovascular: Satisfactory opacification of the pulmonary arteries to the segmental level. Evaluation within the lung bases is slightly degraded by respiratory motion artifact. Within this limitation there is no evidence of pulmonary embolism. Mid ascending thoracic aorta measures 4.0 cm in diameter. Atherosclerotic calcifications of the aorta and coronary arteries. Mild cardiomegaly. No pericardial effusion. Mediastinum/Nodes: No enlarged mediastinal, hilar, or axillary lymph nodes. Thyroid gland, trachea, and esophagus demonstrate no significant findings. Lungs/Pleura: Bilateral dependent opacities, likely passive atelectasis. Elsewhere, lungs are clear. No pleural effusion or pneumothorax. Upper Abdomen: Diffusely decreased attenuation of the hepatic parenchyma compatible with hepatic steatosis. No acute findings within the included upper abdomen. 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 No acute osseous abnormality. No chest wall abnormality. Review of the MIP images confirms the above findings. IMPRESSION: 1. No evidence of  pulmonary embolism. 2. Dependent atelectasis bilaterally. 3. 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 Recommend annual imaging followup by CTA or MRA. This recommendation follows 2010 ACCF/AHA/AATS/ACR/ASA/SCA/SCAI/SIR/STS/SVM Guidelines for the Diagnosis and Management of Patients with Thoracic Aortic Disease. Circulation. 2010; 121:: J335-K562 Aortic aneurysm NOS (ICD10-I71.9) 4. Hepatic steatosis. 5. Aortic and coronary artery atherosclerosis (ICD10-I70.0). Electronically Signed   By: NDavina PokeD.O.   On: 04/23/2021 15:27    Cardiac Studies  Echo pending  Patient Profile  Christian BOWENis a 75y.o. male with hypertension, obesity, OSA with CPAP who was admitted on 04/23/2021 with chest pain.  Assessment & Plan   #Chest pain, possibly cardiac -Admitted with tightness in his chest that occurred from rest.  Symptoms occurred 1 day prior.  Not worsened by exertion and not alleviated by rest.  He reports pain has resolved.  He tells me food helped. -EKG with sinus rhythm and nonspecific ST-T changes. -CT PE study negative.  At bedtime troponins slightly elevated at 18 and 24 on repeat. -No further chest pain.  LDL 109.  -I reviewed his CT PE study.  He has minimal coronary calcifications.  I believe we should proceed with coronary CTA.  He has an 18-gauge catheter in the right AC.  We will give him 100 mg of metoprolol tartrate. -I will start him on aspirin 81 mg daily as well as Lipitor 40 mg daily. -If coronary CTA is negative I suspect this is GERD.  We will follow-up the results of this. -Echo is also pending.  For questions or updates, please contact [place] Please consult [email or web address] for contact info under   Time Spent with Patient: I have spent a total of 35 minutes with patient reviewing hospital notes, telemetry, EKGs, labs and examining the patient as well as establishing an assessment and plan that was discussed with the patient.  > 50% of time  was spent in direct patient care.    Signed, Addison Naegeli. Audie Box, MD, [place]  04/24/2021 8:50 AM

## 2021-04-24 NOTE — Progress Notes (Signed)
Echocardiogram 2D Echocardiogram has been performed.  Christian [name] 04/24/2021, 9:46 AM

## 2021-04-24 NOTE — Progress Notes (Signed)
[facility] PROGRESS NOTE    Progress Note  Christian [name]  URK:[id number] DOB: 05-10-1946 DOA: 04/23/2021 PCP: Mosie Lukes, MD     Brief Narrative:   Christian [name] is an 75 y.o. male past medical history of essential hypertension, obstructive sleep apnea on CPAP obesity comes into the ED for chest pressure that started in the morning of admission pleuritic in nature, in the ED cardiac biomarkers have basically remained flat, CT angio of the chest was negative for PE and pneumonia.  Twelve-lead EKG showed normal sinus rhythm, normal axis, PR interval and nonspecific T wave abnormalities.  Cardiology was consulted recommended an ischemic work-up, his heart score is 3     Assessment/Plan:   Atypical chest pain: Patient is able to walk more than 3 miles a day and remains without any symptoms. He describes as symptoms as pleuritic in nature no cardiac markers have basically remained flat, twelve-lead EKG showed no signs of ischemia. CT angio of the chest was negative for PE and pneumonia. Cardiology was consulted recommended an ischemic work-up this morning. 2D echo has been ordered.  Benign essential hypertension: Continue current home regimen.  Obstructive sleep apnea: Continue CPAP at night.  Morbid obesity: Counseling.   DVT prophylaxis: lovenox Family Communication:none Status is: Observation  The patient remains OBS appropriate and will d/c before 2 midnights.       Code Status:     Code Status Orders  (From admission, onward)           Start     Ordered   04/23/21 2312  Full code  Continuous        04/23/21 2312           Code Status History     This patient has a current code status but no historical code status.         IV Access:   Peripheral IV   Procedures and diagnostic studies:   DG Chest 2 View  Result Date: 04/23/2021 CLINICAL DATA:  Chest pain EXAM: CHEST - 2 VIEW COMPARISON:  None. FINDINGS: Cardiomegaly.  Both lungs are clear. Disc degenerative disease of the thoracic spine. IMPRESSION: Cardiomegaly without acute abnormality of the lungs. Electronically Signed   By: Delanna Ahmadi M.D.   On: 04/23/2021 13:25   CT Angio Chest PE W/Cm &/Or Wo Cm  Result Date: 04/23/2021 CLINICAL DATA:  Chest pressure. Pulmonary embolism (PE) suspected, high prob EXAM: CT ANGIOGRAPHY CHEST WITH CONTRAST TECHNIQUE: Multidetector CT imaging of the chest was performed using the standard protocol during bolus administration of intravenous contrast. Multiplanar CT image reconstructions and MIPs were obtained to evaluate the vascular anatomy. CONTRAST:  166m OMNIPAQUE IOHEXOL 350 MG/ML SOLN COMPARISON:  Same day chest x-ray FINDINGS: Cardiovascular: Satisfactory opacification of the pulmonary arteries to the segmental level. Evaluation within the lung bases is slightly degraded by respiratory motion artifact. Within this limitation there is no evidence of pulmonary embolism. Mid ascending thoracic aorta measures 4.0 cm in diameter. Atherosclerotic calcifications of the aorta and coronary arteries. Mild cardiomegaly. No pericardial effusion. Mediastinum/Nodes: No enlarged mediastinal, hilar, or axillary lymph nodes. Thyroid gland, trachea, and esophagus demonstrate no significant findings. Lungs/Pleura: Bilateral dependent opacities, likely passive atelectasis. Elsewhere, lungs are clear. No pleural effusion or pneumothorax. Upper Abdomen: Diffusely decreased attenuation of the hepatic parenchyma compatible with hepatic steatosis. No acute findings within the included upper abdomen. 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 No acute  osseous abnormality. No chest wall abnormality. Review of the MIP images confirms the above findings. IMPRESSION: 1. No evidence of pulmonary embolism. 2. Dependent atelectasis bilaterally. 3. Ectatic  ascending thoracic aorta measuring 4.0 cm in diameter. Recommend annual imaging followup by CTA or MRA. This recommendation follows 2010 ACCF/AHA/AATS/ACR/ASA/SCA/SCAI/SIR/STS/SVM Guidelines for the Diagnosis and Management of Patients with Thoracic Aortic Disease. Circulation. 2010; 121: W098-J191. Aortic aneurysm NOS (ICD10-I71.9) 4. Hepatic steatosis. 5. Aortic and coronary artery atherosclerosis (ICD10-I70.0). Electronically Signed   By: Davina Poke D.O.   On: 04/23/2021 15:27     Medical Consultants:   None.   Subjective:    Christian [name] no complaints  Objective:    Vitals:   04/23/21 2130 04/23/21 2309 04/23/21 2311 04/24/21 0343  BP: (!) 150/66 (!) 153/74  (!) 150/79  Pulse: 87 92  71  Resp: _0 Temp:  98.9 F (37.2 C)  98.4 F (36.9 C)  TempSrc:  Oral  Oral  SpO2: 95% 95% 95% 97%  Weight:    103.5 kg  Height:       SpO2: 97 % O2 Flow Rate (L/min): 0 L/min FiO2 (%): 21 %   Intake/Output Summary (Last 24 hours) at 04/24/2021 0720 Last data filed at 04/24/2021 0300 Gross per 24 hour  Intake 240 ml  Output 0 ml  Net 240 ml   Filed Weights   04/23/21 1247 04/23/21 1326 04/24/21 0343  Weight: 106.6 kg 106.2 kg 103.5 kg    Exam: General exam: In no acute distress. Respiratory system: Good air movement and clear to auscultation. Cardiovascular system: S1 & S2 heard, RRR. No JVD. Gastrointestinal system: Abdomen is nondistended, soft and nontender.  Extremities: No pedal edema. Skin: No rashes, lesions or ulcers Psychiatry: Judgement and insight appear normal. Mood & affect appropriate.    Data Reviewed:    Labs: Basic Metabolic Panel: Recent Labs  Lab 04/23/21 1325 04/24/21 0129  NA 136 134*  K 3.6 3.3*  CL 99 100  CO2 26 24  GLUCOSE 113* 136*  BUN 17 10  CREATININE 0.87 0.79  CALCIUM 9.7 9.1   GFR Estimated Creatinine Clearance: 92.4 mL/min (by C-G formula based on SCr of 0.79 mg/dL). Liver Function Tests: Recent Labs  Lab  04/24/21 0129  AST 23  ALT 26  ALKPHOS 61  BILITOT 0.8  PROT 7.0  ALBUMIN 3.5   No results for input(s): LIPASE, AMYLASE in the last 168 hours. No results for input(s): AMMONIA in the last 168 hours. Coagulation profile No results for input(s): INR, PROTIME in the last 168 hours. COVID-19 Labs  No results for input(s): DDIMER, FERRITIN, LDH, CRP in the last 72 hours.  Lab Results  Component Value Date   [place] NEGATIVE 04/23/2021    CBC: Recent Labs  Lab 04/23/21 1325 04/24/21 0129  WBC 11.7* 9.0  NEUTROABS  --  5.9  HGB 14.3 13.8  HCT 42.9 41.4  MCV 92.7 93.0  PLT 213 197   Cardiac Enzymes: No results for input(s): CKTOTAL, CKMB, CKMBINDEX, TROPONINI in the last 168 hours. BNP (last 3 results) No results for input(s): PROBNP in the last 8760 hours. CBG: No results for input(s): GLUCAP in the last 168 hours. D-Dimer: No results for input(s): DDIMER in the last 72 hours. Hgb A1c: No results for input(s): HGBA1C in the last 72 hours. Lipid Profile: Recent Labs    04/24/21 0129  CHOL 178  HDL 52  LDLCALC 109*  TRIG 86  CHOLHDL 3.4  Thyroid function studies: No results for input(s): TSH, T4TOTAL, T3FREE, THYROIDAB in the last 72 hours.  Invalid input(s): FREET3 Anemia work up: No results for input(s): VITAMINB12, FOLATE, FERRITIN, TIBC, IRON, RETICCTPCT in the last 72 hours. Sepsis Labs: Recent Labs  Lab 04/23/21 1325 04/24/21 0129  WBC 11.7* 9.0   Microbiology Recent Results (from the past 240 hour(s))  Resp Panel by RT-PCR (Flu A&B, Covid) Nasopharyngeal Swab     Status: None   Collection Time: 04/23/21  1:26 PM   Specimen: Nasopharyngeal Swab; Nasopharyngeal(NP) swabs in vial transport medium  Result Value Ref Range Status   SARS Coronavirus 2 by RT PCR NEGATIVE NEGATIVE Final    Comment: (NOTE) SARS-CoV-2 target nucleic acids are NOT DETECTED.  The SARS-CoV-2 RNA is generally detectable in upper respiratory specimens during the acute  phase of infection. The lowest concentration of SARS-CoV-2 viral copies this assay can detect is 138 copies/mL. A negative result does not preclude SARS-Cov-2 infection and should not be used as the sole basis for treatment or other patient management decisions. A negative result may occur with  improper specimen collection/handling, submission of specimen other than nasopharyngeal swab, presence of viral mutation(s) within the areas targeted by this assay, and inadequate number of viral copies(<138 copies/mL). A negative result must be combined with clinical observations, patient history, and epidemiological information. The expected result is Negative.  Fact Sheet for Patients:  EntrepreneurPulse.com.au  Fact Sheet for Healthcare Providers:  IncredibleEmployment.be  This test is no t yet approved or cleared by the [place] FDA and  has been authorized for detection and/or diagnosis of SARS-CoV-2 by FDA under an Emergency Use Authorization (EUA). This EUA will remain  in effect (meaning this test can be used) for the duration of the COVID-19 declaration under Section 564(b)(1) of the Act, 21 U.S.C.section 360bbb-3(b)(1), unless the authorization is terminated  or revoked sooner.       Influenza A by PCR NEGATIVE NEGATIVE Final   Influenza B by PCR NEGATIVE NEGATIVE Final    Comment: (NOTE) The Xpert Xpress SARS-CoV-2/FLU/RSV plus assay is intended as an aid in the diagnosis of influenza from Nasopharyngeal swab specimens and should not be used as a sole basis for treatment. Nasal washings and aspirates are unacceptable for Xpert Xpress SARS-CoV-2/FLU/RSV testing.  Fact Sheet for Patients: EntrepreneurPulse.com.au  Fact Sheet for Healthcare Providers: IncredibleEmployment.be  This test is not yet approved or cleared by the [place] FDA and has been authorized for detection and/or diagnosis of  SARS-CoV-2 by FDA under an Emergency Use Authorization (EUA). This EUA will remain in effect (meaning this test can be used) for the duration of the COVID-19 declaration under Section 564(b)(1) of the Act, 21 U.S.C. section 360bbb-3(b)(1), unless the authorization is terminated or revoked.  Performed at [facility], [address] [place], [place] [address]      Medications:    aspirin  324 mg Oral Once   enoxaparin (LOVENOX) injection  40 mg Subcutaneous Q24H   metoprolol tartrate  100 mg Oral Once   Continuous Infusions:    LOS: 0 days   Charlynne Cousins  [facility]  04/24/2021, 7:20 AM

## 2021-04-24 NOTE — Progress Notes (Signed)
Transition of Care [facility]) Screening Note   Patient Details  Name: Christian [name] Date of Birth: [birth year]   Transition of Care [facility]) CM/SW Contact:    Zenon Mayo, RN Phone Number: 04/24/2021, 3:54 PM    Transition of Care Department [facility]) has reviewed patient and no TOC needs have been identified at this time. We will continue to monitor patient advancement through interdisciplinary progression rounds. If new patient transition needs arise, please place a TOC consult.

## 2021-04-24 NOTE — Progress Notes (Signed)
Mobility Specialist Progress Note:   04/24/21 1237  Mobility  Activity Ambulated in room  Level of Assistance Independent  Assistive Device None  Distance Ambulated (ft) 40 ft  Mobility Ambulated with assistance in room  Mobility Response Tolerated well  Mobility performed by Mobility specialist  $Mobility charge 1 Mobility   Pt received in bed willing to participate in mobility. No complaints of pain. Pt declined ambulation in the hallway. Left in bed with call bell in reach and all needs met.   [facility] Public librarian Phone [phone] Secondary Phone [phone]

## 2021-04-25 ENCOUNTER — Telehealth: Payer: Self-pay

## 2021-04-25 DIAGNOSIS — E785 Hyperlipidemia, unspecified: Secondary | ICD-10-CM | POA: Diagnosis not present

## 2021-04-25 DIAGNOSIS — R0789 Other chest pain: Secondary | ICD-10-CM | POA: Diagnosis not present

## 2021-04-25 DIAGNOSIS — I10 Essential (primary) hypertension: Secondary | ICD-10-CM | POA: Diagnosis not present

## 2021-04-25 DIAGNOSIS — R778 Other specified abnormalities of plasma proteins: Secondary | ICD-10-CM | POA: Diagnosis not present

## 2021-04-25 NOTE — Care Management Obs Status (Signed)
[place] NOTIFICATION   Patient Details  Name: DABNEY SCHANZ MRN: [id number] Date of Birth: [birth year]   Medicare Observation Status Notification Given:  Yes    Carles Collet, RN 04/25/2021, 8:59 AM

## 2021-04-25 NOTE — Discharge Summary (Signed)
Physician Discharge Summary  Christian [name] HAL:[id number] DOB: Aug 29, 1945 DOA: 04/23/2021  PCP: Mosie Lukes, MD  Admit date: 04/23/2021 Discharge date: 04/25/2021  Admitted From: Home Disposition:  Home  Recommendations for Outpatient Follow-up:  Follow up with PCP in 1-2 weeks Please obtain BMP/CBC in one week  Home Health:No Equipment/Devices:none  Discharge Condition:Stable CODE STATUS:Full Diet recommendation: Heart Healthy   Brief/Interim Summary: 75 y.o. male past medical history of essential hypertension, obstructive sleep apnea on CPAP obesity comes into the ED for chest pressure that started in the morning of admission pleuritic in nature, in the ED cardiac biomarkers have basically remained flat, CT angio of the chest was negative for PE and pneumonia.  Twelve-lead EKG showed normal sinus rhythm, normal axis, PR interval and nonspecific T wave abnormalities.  Cardiology was consulted recommended an ischemic work-up, his heart score is 3  Discharge Diagnoses:  Principal Problem:   Chest pressure Active Problems:   Obstructive sleep apnea   Hyperlipidemia, mild   Obesity   Benign essential HTN  Atypical chest pain: Patient is able to walk more than 3 miles without getting any symptoms. He relates his chest pain was pleuritic in nature. Cardiac biomarkers have basically remained flat, twelve-lead EKG showed no signs of ischemia, CT angio of the chest was negative for PE and pneumonia. Cardiology was consulted recommended an ischemic work-up with a CTA of the coronaries that showed mild nonobstructive disease with a CAD RADS score of 2 calcium Score of 800 and and 48. 2D echo showed mild pulmonary hypertension, cardiology was consulted they are concerned about ILD leading to pulmonary hypertension. He will follow-up with pulmonary as an outpatient.  Benign essential hypertension: Continue current home regimen no changes made.  Obstructive sleep apnea: Continue  CPAP at night.  Morbid obesity: Counseling  Discharge Instructions  Discharge Instructions     Diet - low sodium heart healthy   Complete by: As directed    Increase activity slowly   Complete by: As directed       Allergies as of 04/25/2021       Reactions   Codeine Anaphylaxis        Medication List     TAKE these medications    cyclobenzaprine 5 MG tablet Commonly known as: FLEXERIL Take 0.5-1 tablets (2.5-5 mg total) by mouth 2 (two) times daily as needed for muscle spasms.   hydrochlorothiazide 25 MG tablet Commonly known as: HYDRODIURIL TAKE 1 TABLET BY MOUTH EVERY DAY   losartan 100 MG tablet Commonly known as: COZAAR TAKE 1 TABLET BY MOUTH EVERY DAY   multivitamin tablet Take 1 tablet by mouth daily.   polyvinyl alcohol 1.4 % ophthalmic solution Commonly known as: LIQUIFILM TEARS Place 1 drop into both eyes as needed for dry eyes.   pravastatin 40 MG tablet Commonly known as: PRAVACHOL TAKE 1 TABLET BY MOUTH EVERY DAY   PRESERVISION AREDS 2 PO Take 1 capsule by mouth 2 (two) times daily.        Follow-up Information     Schedule an appointment as soon as possible for a visit  with Mosie Lukes, MD.   Specialty: Family Medicine Contact information: [address] [place] [address] [phone]         Schedule an appointment as soon as possible for a visit  with [facility].   Specialty: Cardiology Contact information: [address], [place]  Allergies  Allergen Reactions   Codeine Anaphylaxis    Consultations: Cardiology   Procedures/Studies: DG Chest 2 View  Result Date: 04/23/2021 CLINICAL DATA:  Chest pain EXAM: CHEST - 2 VIEW COMPARISON:  None. FINDINGS: Cardiomegaly. Both lungs are clear. Disc degenerative disease of the thoracic spine. IMPRESSION: Cardiomegaly without acute abnormality of the lungs.  Electronically Signed   By: Delanna Ahmadi M.D.   On: 04/23/2021 13:25   CT Angio Chest PE W/Cm &/Or Wo Cm  Result Date: 04/23/2021 CLINICAL DATA:  Chest pressure. Pulmonary embolism (PE) suspected, high prob EXAM: CT ANGIOGRAPHY CHEST WITH CONTRAST TECHNIQUE: Multidetector CT imaging of the chest was performed using the standard protocol during bolus administration of intravenous contrast. Multiplanar CT image reconstructions and MIPs were obtained to evaluate the vascular anatomy. CONTRAST:  181m OMNIPAQUE IOHEXOL 350 MG/ML SOLN COMPARISON:  Same day chest x-ray FINDINGS: Cardiovascular: Satisfactory opacification of the pulmonary arteries to the segmental level. Evaluation within the lung bases is slightly degraded by respiratory motion artifact. Within this limitation there is no evidence of pulmonary embolism. Mid ascending thoracic aorta measures 4.0 cm in diameter. Atherosclerotic calcifications of the aorta and coronary arteries. Mild cardiomegaly. No pericardial effusion. Mediastinum/Nodes: No enlarged mediastinal, hilar, or axillary lymph nodes. Thyroid gland, trachea, and esophagus demonstrate no significant findings. Lungs/Pleura: Bilateral dependent opacities, likely passive atelectasis. Elsewhere, lungs are clear. No pleural effusion or pneumothorax. Upper Abdomen: Diffusely decreased attenuation of the hepatic parenchyma compatible with hepatic steatosis. No acute findings within the included upper abdomen. 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 No acute osseous abnormality. No chest wall abnormality. Review of the MIP images confirms the above findings. IMPRESSION: 1. No evidence of pulmonary embolism. 2. Dependent atelectasis bilaterally. 3. 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 Recommend annual imaging followup by CTA or MRA. This recommendation follows  2010 ACCF/AHA/AATS/ACR/ASA/SCA/SCAI/SIR/STS/SVM Guidelines for the Diagnosis and Management of Patients with Thoracic Aortic Disease. Circulation. 2010; 121:: S962-E366 Aortic aneurysm NOS (ICD10-I71.9) 4. Hepatic steatosis. 5. Aortic and coronary artery atherosclerosis (ICD10-I70.0). Electronically Signed   By: NDavina PokeD.O.   On: 04/23/2021 15:27   CT CORONARY MORPH W/CTA COR W/SCORE W/CA W/CM &/OR WO/CM  Addendum Date: 04/24/2021   ADDENDUM REPORT: 04/24/2021 15:50 HISTORY: Chest pain, nonspecific EXAM: Cardiac/Coronary CT TECHNIQUE: The patient was scanned on a [employer] PROTOCOL: A 120 kV prospective scan was triggered in the descending thoracic aorta at 111 HU's. Axial non-contrast 3 mm slices were carried out through the heart. The data set was analyzed on a dedicated work station and scored using the [place] G[place] rotation speed was 250 msecs and collimation was 0.6 mm. Heart rate optimized medically, and 0.8 mg of sublingual nitroglycerin was given. The 3D data set was reconstructed in 5% intervals of 35-75% of the R-R cycle. Diastolic phases were analyzed on a dedicated work station using MPR, MIP and VRT modes. The patient received 955mOMNIPAQUE IOHEXOL 350 MG/ML SOLN of contrast. FINDINGS: Coronary calcium score: The patient's coronary artery calcium score is 848, which places the patient in the 74 percentile. Coronary arteries: Normal coronary origins.  Right dominance. Right Coronary Artery: Normal caliber vessel, gives rise to PDA. Scattered noncalcified plaque with trivial calcified plaque, with 1-24% stenosis. Left Main Coronary Artery: Normal caliber vessel. Ostial mixed calcified plaque with 1-24% stenosis. Left Anterior Descending Coronary Artery: Normal caliber vessel. Diffuse mixed calcified and noncalcified plaque throughout the proximal and mid  LAD with maximum 25-49% stenosis. Gives rise to 2 diagonal branches. Distal LAD wraps apex. Left Circumflex  Artery: Normal caliber vessel. Predominantly calcified plaque in the proximal LCx with 1-24% stenosis. Scattered noncalcified plaque seen throughout remainder of proximal and mid LCx without significant stenosis. Gives rise to 2 OM branches. Aorta: Mildly enlarged size, 42 mm at the mid ascending aorta (level of the PA bifurcation) measured double oblique. Calcifications consistent with aortic atherosclerosis. No dissection seen in visualized portions of the aorta. Aortic Valve: Scattered calcifications. Trileaflet. Other findings: Normal pulmonary vein drainage into the left atrium. Normal left atrial appendage without a thrombus. Normal size of the pulmonary artery. Normal appearance of the pericardium. IMPRESSION: 1.  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 2. Coronary calcium score of 848. This was 74th percentile for age and sex matched control. 3. Normal coronary origin with right dominance. 4.  Aortic atherosclerosis 5.  Mild enlargement of ascending aorta (42 mm). INTERPRETATION: 1. CAD-RADS 0: No evidence of CAD (0%). Consider non-atherosclerotic causes of chest pain. 2. CAD-RADS 1: Minimal non-obstructive CAD (0-24%). Consider non-atherosclerotic causes of chest pain. Consider preventive therapy and risk factor modification. 3. CAD-RADS 2: Mild non-obstructive CAD (25-49%). Consider non-atherosclerotic causes of chest pain. Consider preventive therapy and risk factor modification. 4. CAD-RADS 3: Moderate stenosis (50-69%). Consider symptom-guided anti-ischemic pharmacotherapy as well as risk factor modification per guideline directed care. Additional analysis with CT FFR will be submitted. 5. CAD-RADS 4: Severe stenosis. (70-99% or > 50% left main). Cardiac catheterization or CT FFR is recommended. Consider symptom-guided anti-ischemic pharmacotherapy as well as risk factor modification per guideline directed care. Invasive coronary angiography recommended with revascularization per published guideline  statements. 6. CAD-RADS 5: Total coronary occlusion (100%). Consider cardiac catheterization or viability assessment. Consider symptom-guided anti-ischemic pharmacotherapy as well as risk factor modification per guideline directed care. 7. CAD-RADS N: Non-diagnostic study. Obstructive CAD can't be excluded. Alternative evaluation is recommended. Electronically Signed   By: Buford Dresser M.D.   On: 04/24/2021 15:50   Result Date: 04/24/2021 EXAM: OVER-READ INTERPRETATION  CT CHEST The following report is an over-read performed by radiologist Dr. Vinnie Langton of [facility], [place] on 04/24/2021. This over-read does not include interpretation of cardiac or coronary anatomy or pathology. The coronary calcium score/coronary CTA interpretation by the cardiologist is attached. COMPARISON:  None. FINDINGS: Aortic atherosclerosis. Areas of ground-glass attenuation, septal thickening and architectural distortion noted in the lung bases bilaterally, concerning for potential interstitial lung disease. Within the visualized portions of the thorax there are no suspicious appearing pulmonary nodules or masses, there is no acute consolidative airspace disease, no pleural effusions, no pneumothorax and no lymphadenopathy. Visualized portions of the upper abdomen demonstrates diffuse low attenuation throughout the visualized hepatic parenchyma, indicative of hepatic steatosis. There are no aggressive appearing lytic or blastic lesions noted in the visualized portions of the skeleton. IMPRESSION: 1. The appearance of the lung bases is concerning for probable interstitial lung disease. Further evaluation with nonemergent high-resolution chest CT is recommended in the near future to better evaluate these findings. 2.  Aortic Atherosclerosis (ICD10-I70.0). 3. Hepatic steatosis. Electronically Signed: By: Vinnie Langton M.D. On: 04/24/2021 11:32   ECHOCARDIOGRAM COMPLETE  Result Date: 04/24/2021     ECHOCARDIOGRAM REPORT   Patient Name:   JARROD MCENERY Date of Exam: 04/24/2021 Medical Rec #:  [id number]      Height:       68.5 in Accession #:    [id number]     Weight:  228.2 lb Date of Birth:  1946/04/24       BSA:          2.173 m Patient Age:    12 years       BP:           149/73 mmHg Patient Gender: M              HR:           64 bpm. Exam Location:  Inpatient Procedure: 2D Echo, Cardiac Doppler and Color Doppler Indications:    R07.9* Chest pain, unspecified  History:        Patient has no prior history of Echocardiogram examinations.                 Risk Factors:Dyslipidemia and Hypertension.  Sonographer:    Bernadene Person RDCS Referring Phys: [place]  1. Left ventricular ejection fraction, by estimation, is 60 to 65%. The left ventricle has normal function. The left ventricle has no regional wall motion abnormalities. There is mild concentric left ventricular hypertrophy. Left ventricular diastolic parameters were normal.  2. Right ventricular systolic function is mildly reduced. The right ventricular size is normal. There is mildly elevated pulmonary artery systolic pressure. The estimated right ventricular systolic pressure is 07.6 mmHg.  3. Left atrial size was mildly dilated.  4. The mitral valve is normal in structure. No evidence of mitral valve regurgitation. No evidence of mitral stenosis.  5. The aortic valve is tricuspid. Aortic valve regurgitation is trivial. Aortic valve sclerosis is present, with no evidence of aortic valve stenosis.  6. Aortic dilatation noted. There is mild dilatation of the aortic root, measuring 40 mm. There is mild dilatation of the ascending aorta, measuring 42 mm.  7. The inferior vena cava is dilated in size with <50% respiratory variability, suggesting right atrial pressure of 15 mmHg. FINDINGS  Left Ventricle: Left ventricular ejection fraction, by estimation, is 60 to 65%. The left ventricle has normal function. The left  ventricle has no regional wall motion abnormalities. The left ventricular internal cavity size was normal in size. There is  mild concentric left ventricular hypertrophy. Left ventricular diastolic parameters were normal. Normal left ventricular filling pressure. Right Ventricle: The right ventricular size is normal. No increase in right ventricular wall thickness. Right ventricular systolic function is mildly reduced. There is mildly elevated pulmonary artery systolic pressure. The tricuspid regurgitant velocity  is 2.38 m/s, and with an assumed right atrial pressure of 15 mmHg, the estimated right ventricular systolic pressure is 22.6 mmHg. Left Atrium: Left atrial size was mildly dilated. Right Atrium: Right atrial size was normal in size. Pericardium: There is no evidence of pericardial effusion. Mitral Valve: The mitral valve is normal in structure. No evidence of mitral valve regurgitation. No evidence of mitral valve stenosis. Tricuspid Valve: The tricuspid valve is normal in structure. Tricuspid valve regurgitation is mild . No evidence of tricuspid stenosis. Aortic Valve: The aortic valve is tricuspid. Aortic valve regurgitation is trivial. Aortic valve sclerosis is present, with no evidence of aortic valve stenosis. Pulmonic Valve: The pulmonic valve was normal in structure. Pulmonic valve regurgitation is not visualized. No evidence of pulmonic stenosis. Aorta: Aortic dilatation noted. There is mild dilatation of the aortic root, measuring 40 mm. There is mild dilatation of the ascending aorta, measuring 42 mm. Venous: The inferior vena cava is dilated in size with less than 50% respiratory variability, suggesting right atrial pressure of 15 mmHg. IAS/Shunts: No  atrial level shunt detected by color flow Doppler.  LEFT VENTRICLE PLAX 2D LVIDd:         4.60 cm   Diastology LVIDs:         2.60 cm   LV e' medial:    8.51 cm/s LV PW:         1.20 cm   LV E/e' medial:  10.5 LV IVS:        1.10 cm   LV e'  lateral:   8.27 cm/s LVOT diam:     2.30 cm   LV E/e' lateral: 10.8 LV SV:         77 LV SV Index:   35 LVOT Area:     4.15 cm  RIGHT VENTRICLE RV S prime:     6.80 cm/s TAPSE (M-mode): 1.8 cm LEFT ATRIUM             Index        RIGHT ATRIUM           Index LA diam:        3.90 cm 1.79 cm/m   RA Area:     14.30 cm LA Vol (A2C):   54.8 ml 25.22 ml/m  RA Volume:   25.70 ml  11.83 ml/m LA Vol (A4C):   55.3 ml 25.45 ml/m LA Biplane Vol: 57.8 ml 26.60 ml/m  AORTIC VALVE LVOT Vmax:   80.90 cm/s LVOT Vmean:  58.900 cm/s LVOT VTI:    0.185 m  AORTA Ao Root diam: 4.00 cm Ao Asc diam:  4.20 cm MITRAL VALVE               TRICUSPID VALVE MV Area (PHT): 4.21 cm    TR Peak grad:   22.7 mmHg MV Decel Time: 180 msec    TR Vmax:        238.00 cm/s MV E velocity: 89.10 cm/s MV A velocity: 69.80 cm/s  SHUNTS MV E/A ratio:  1.28        Systemic VTI:  0.18 m                            Systemic Diam: 2.30 cm Dani Gobble Croitoru MD Electronically signed by Sanda Klein MD Signature Date/Time: 04/24/2021/10:12:25 AM    Final       Subjective: No complaints, no further chest pain.  Discharge Exam: Vitals:   04/24/21 1946 04/25/21 0416  BP: 140/69 (!) 144/73  Pulse: 60 64  Resp: 17 18  Temp: 99.2 F (37.3 C) 98.7 F (37.1 C)  SpO2: 96% 96%   Vitals:   04/24/21 1142 04/24/21 1642 04/24/21 1946 04/25/21 0416  BP: 123/72 (!) 146/91 140/69 (!) 144/73  Pulse: 64 74 60 64  Resp: _0 Temp: 98 F (36.7 C) 98.4 F (36.9 C) 99.2 F (37.3 C) 98.7 F (37.1 C)  TempSrc: Oral Oral Oral Oral  SpO2: 94% 96% 96% 96%  Weight:    102.8 kg  Height:        General: Pt is alert, awake, not in acute distress Cardiovascular: RRR, S1/S2 +, no rubs, no gallops Respiratory: CTA bilaterally, no wheezing, no rhonchi Abdominal: Soft, NT, ND, bowel sounds + Extremities: no edema, no cyanosis    The results of significant diagnostics from this hospitalization (including imaging, microbiology, ancillary and laboratory)  are listed below for reference.     Microbiology: Recent Results (from the past 240 hour(s))  Resp Panel by RT-PCR (Flu A&B, Covid) Nasopharyngeal Swab     Status: None   Collection Time: 04/23/21  1:26 PM   Specimen: Nasopharyngeal Swab; Nasopharyngeal(NP) swabs in vial transport medium  Result Value Ref Range Status   SARS Coronavirus 2 by RT PCR NEGATIVE NEGATIVE Final    Comment: (NOTE) SARS-CoV-2 target nucleic acids are NOT DETECTED.  The SARS-CoV-2 RNA is generally detectable in upper respiratory specimens during the acute phase of infection. The lowest concentration of SARS-CoV-2 viral copies this assay can detect is 138 copies/mL. A negative result does not preclude SARS-Cov-2 infection and should not be used as the sole basis for treatment or other patient management decisions. A negative result may occur with  improper specimen collection/handling, submission of specimen other than nasopharyngeal swab, presence of viral mutation(s) within the areas targeted by this assay, and inadequate number of viral copies(<138 copies/mL). A negative result must be combined with clinical observations, patient history, and epidemiological information. The expected result is Negative.  Fact Sheet for Patients:  EntrepreneurPulse.com.au  Fact Sheet for Healthcare Providers:  IncredibleEmployment.be  This test is no t yet approved or cleared by the [place] FDA and  has been authorized for detection and/or diagnosis of SARS-CoV-2 by FDA under an Emergency Use Authorization (EUA). This EUA will remain  in effect (meaning this test can be used) for the duration of the COVID-19 declaration under Section 564(b)(1) of the Act, 21 U.S.C.section 360bbb-3(b)(1), unless the authorization is terminated  or revoked sooner.       Influenza A by PCR NEGATIVE NEGATIVE Final   Influenza B by PCR NEGATIVE NEGATIVE Final    Comment: (NOTE) The Xpert Xpress  SARS-CoV-2/FLU/RSV plus assay is intended as an aid in the diagnosis of influenza from Nasopharyngeal swab specimens and should not be used as a sole basis for treatment. Nasal washings and aspirates are unacceptable for Xpert Xpress SARS-CoV-2/FLU/RSV testing.  Fact Sheet for Patients: EntrepreneurPulse.com.au  Fact Sheet for Healthcare Providers: IncredibleEmployment.be  This test is not yet approved or cleared by the [place] FDA and has been authorized for detection and/or diagnosis of SARS-CoV-2 by FDA under an Emergency Use Authorization (EUA). This EUA will remain in effect (meaning this test can be used) for the duration of the COVID-19 declaration under Section 564(b)(1) of the Act, 21 U.S.C. section 360bbb-3(b)(1), unless the authorization is terminated or revoked.  Performed at [facility], [address] [place], [place] [address]      Labs: BNP (last 3 results) No results for input(s): BNP in the last 8760 hours. Basic Metabolic Panel: Recent Labs  Lab 04/23/21 1325 04/24/21 0129  NA 136 134*  K 3.6 3.3*  CL 99 100  CO2 26 24  GLUCOSE 113* 136*  BUN 17 10  CREATININE 0.87 0.79  CALCIUM 9.7 9.1   Liver Function Tests: Recent Labs  Lab 04/24/21 0129  AST 23  ALT 26  ALKPHOS 61  BILITOT 0.8  PROT 7.0  ALBUMIN 3.5   No results for input(s): LIPASE, AMYLASE in the last 168 hours. No results for input(s): AMMONIA in the last 168 hours. CBC: Recent Labs  Lab 04/23/21 1325 04/24/21 0129  WBC 11.7* 9.0  NEUTROABS  --  5.9  HGB 14.3 13.8  HCT 42.9 41.4  MCV 92.7 93.0  PLT 213 197   Cardiac Enzymes: No results for input(s): CKTOTAL, CKMB, CKMBINDEX, TROPONINI in the last 168 hours. BNP: Invalid input(s): POCBNP CBG: No results for  input(s): GLUCAP in the last 168 hours. D-Dimer No results for input(s): DDIMER in the last 72 hours. Hgb A1c No results for input(s): HGBA1C in the last 72  hours. Lipid Profile Recent Labs    04/24/21 0129  CHOL 178  HDL 52  LDLCALC 109*  TRIG 86  CHOLHDL 3.4   Thyroid function studies No results for input(s): TSH, T4TOTAL, T3FREE, THYROIDAB in the last 72 hours.  Invalid input(s): FREET3 Anemia work up No results for input(s): VITAMINB12, FOLATE, FERRITIN, TIBC, IRON, RETICCTPCT in the last 72 hours. Urinalysis    Component Value Date/Time   COLORURINE YELLOW 05/29/2015 1136   APPEARANCEUR CLEAR 05/29/2015 1136   LABSPEC 1.015 05/29/2015 1136   PHURINE 7.0 05/29/2015 1136   GLUCOSEU NEGATIVE 05/29/2015 1136   HGBUR NEGATIVE 05/29/2015 1136   BILIRUBINUR NEGATIVE 05/29/2015 1136   KETONESUR NEGATIVE 05/29/2015 1136   UROBILINOGEN 0.2 05/29/2015 1136   NITRITE NEGATIVE 05/29/2015 1136   LEUKOCYTESUR NEGATIVE 05/29/2015 1136   Sepsis Labs Invalid input(s): PROCALCITONIN,  WBC,  LACTICIDVEN Microbiology Recent Results (from the past 240 hour(s))  Resp Panel by RT-PCR (Flu A&B, Covid) Nasopharyngeal Swab     Status: None   Collection Time: 04/23/21  1:26 PM   Specimen: Nasopharyngeal Swab; Nasopharyngeal(NP) swabs in vial transport medium  Result Value Ref Range Status   SARS Coronavirus 2 by RT PCR NEGATIVE NEGATIVE Final    Comment: (NOTE) SARS-CoV-2 target nucleic acids are NOT DETECTED.  The SARS-CoV-2 RNA is generally detectable in upper respiratory specimens during the acute phase of infection. The lowest concentration of SARS-CoV-2 viral copies this assay can detect is 138 copies/mL. A negative result does not preclude SARS-Cov-2 infection and should not be used as the sole basis for treatment or other patient management decisions. A negative result may occur with  improper specimen collection/handling, submission of specimen other than nasopharyngeal swab, presence of viral mutation(s) within the areas targeted by this assay, and inadequate number of viral copies(<138 copies/mL). A negative result must be  combined with clinical observations, patient history, and epidemiological information. The expected result is Negative.  Fact Sheet for Patients:  EntrepreneurPulse.com.au  Fact Sheet for Healthcare Providers:  IncredibleEmployment.be  This test is no t yet approved or cleared by the [place] FDA and  has been authorized for detection and/or diagnosis of SARS-CoV-2 by FDA under an Emergency Use Authorization (EUA). This EUA will remain  in effect (meaning this test can be used) for the duration of the COVID-19 declaration under Section 564(b)(1) of the Act, 21 U.S.C.section 360bbb-3(b)(1), unless the authorization is terminated  or revoked sooner.       Influenza A by PCR NEGATIVE NEGATIVE Final   Influenza B by PCR NEGATIVE NEGATIVE Final    Comment: (NOTE) The Xpert Xpress SARS-CoV-2/FLU/RSV plus assay is intended as an aid in the diagnosis of influenza from Nasopharyngeal swab specimens and should not be used as a sole basis for treatment. Nasal washings and aspirates are unacceptable for Xpert Xpress SARS-CoV-2/FLU/RSV testing.  Fact Sheet for Patients: EntrepreneurPulse.com.au  Fact Sheet for Healthcare Providers: [employer]  This test is not yet approved or cleared by the [place] FDA and has been authorized for detection and/or diagnosis of SARS-CoV-2 by FDA under an Emergency Use Authorization (EUA). This EUA will remain in effect (meaning this test can be used) for the duration of the COVID-19 declaration under Section 564(b)(1) of the Act, 21 U.S.C. section 360bbb-3(b)(1), unless the authorization is terminated or revoked.  Performed  at [facility], [address] [place], [place] [address]     SIGNED:   Charlynne Cousins, MD  [facility] 04/25/2021, 9:08 AM Pager   If 7PM-7AM, please contact night-coverage [email or web address] Password  [email or web address]

## 2021-04-25 NOTE — Telephone Encounter (Signed)
Called patient, per request to get patient in to see Dr.O'Neal in 6-8 weeks. Patient is scheduled 06/14/21 at 11:00 AM.  Patient made aware, advised of address.

## 2021-04-25 NOTE — Progress Notes (Signed)
Cardiology Progress Note  Patient ID: Christian [name] MRN: [id number] DOB: 08/10/1945 Date of Encounter: 04/25/2021  Primary Cardiologist: None  Subjective   Chief Complaint: None.  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  No further chest pain.  Concern for interstitial lung disease on chest CT.  ROS:  All other ROS reviewed and negative. Pertinent positives noted in the HPI.     Inpatient Medications  Scheduled Meds:  aspirin  324 mg Oral Once   aspirin  81 mg Oral Daily   atorvastatin  40 mg Oral Daily   enoxaparin (LOVENOX) injection  40 mg Subcutaneous Q24H   Continuous Infusions:  PRN Meds: acetaminophen **OR** acetaminophen, nitroGLYCERIN, ondansetron **OR** ondansetron (ZOFRAN) IV   Vital Signs   Vitals:   04/24/21 1142 04/24/21 1642 04/24/21 1946 04/25/21 0416  BP: 123/72 (!) 146/91 140/69 (!) 144/73  Pulse: 64 74 60 64  Resp: _0 Temp: 98 F (36.7 C) 98.4 F (36.9 C) 99.2 F (37.3 C) 98.7 F (37.1 C)  TempSrc: Oral Oral Oral Oral  SpO2: 94% 96% 96% 96%  Weight:    102.8 kg  Height:        Intake/Output Summary (Last 24 hours) at 04/25/2021 0820 Last data filed at 04/24/2021 2000 Gross per 24 hour  Intake 240 ml  Output 750 ml  Net -510 ml   Last 3 Weights 04/25/2021 04/24/2021 04/23/2021  Weight (lbs) 226 lb 9.6 oz 228 lb 2.8 oz 234 lb 2.1 oz  Weight (kg) 102.785 kg 103.5 kg 106.2 kg      Telemetry  Overnight telemetry shows sinus bradycardia 50s, which I personally reviewed.    Physical Exam   Vitals:   04/24/21 1142 04/24/21 1642 04/24/21 1946 04/25/21 0416  BP: 123/72 (!) 146/91 140/69 (!) 144/73  Pulse: 64 74 60 64  Resp: _1 Temp: 98 F (36.7 C) 98.4 F (36.9 C) 99.2 F (37.3 C) 98.7 F (37.1 C)  TempSrc: Oral Oral Oral Oral  SpO2: 94% 96% 96% 96%  Weight:    102.8 kg  Height:        Intake/Output Summary (Last 24 hours) at 04/25/2021 0820 Last data filed at 04/24/2021 2000 Gross per 24 hour  Intake 240 ml  Output  750 ml  Net -510 ml    Last 3 Weights 04/25/2021 04/24/2021 04/23/2021  Weight (lbs) 226 lb 9.6 oz 228 lb 2.8 oz 234 lb 2.1 oz  Weight (kg) 102.785 kg 103.5 kg 106.2 kg    Body mass index is 33.95 kg/m.  General: Well nourished, well developed, in no acute distress Head: Atraumatic, normal size  Eyes: PEERLA, EOMI  Neck: Supple, no JVD Endocrine: No thryomegaly Cardiac: Normal S1, S2; RRR; no murmurs, rubs, or gallops Lungs: Clear to auscultation bilaterally, no wheezing, rhonchi or rales  Abd: Soft, nontender, no hepatomegaly  Ext: No edema, pulses 2+ Musculoskeletal: No deformities, BUE and BLE strength normal and equal Skin: Warm and dry, no rashes   Neuro: Alert and oriented to person, place, time, and situation, CNII-XII grossly intact, no focal deficits  Psych: Normal mood and affect   Labs  High Sensitivity Troponin:   Recent Labs  Lab 04/23/21 1325 04/23/21 1512 04/24/21 0510  TROPONINIHS 20* 18* 24*     Cardiac EnzymesNo results for input(s): TROPONINI in the last 168 hours. No results for input(s): TROPIPOC in the last 168 hours.  Chemistry Recent Labs  Lab 04/23/21 1325 04/24/21 0129  NA 136  134*  K 3.6 3.3*  CL 99 100  CO2 26 24  GLUCOSE 113* 136*  BUN 17 10  CREATININE 0.87 0.79  CALCIUM 9.7 9.1  PROT  --  7.0  ALBUMIN  --  3.5  AST  --  23  ALT  --  26  ALKPHOS  --  61  BILITOT  --  0.8  GFRNONAA >60 >60  ANIONGAP 11 10    Hematology Recent Labs  Lab 04/23/21 1325 04/24/21 0129  WBC 11.7* 9.0  RBC 4.63 4.45  HGB 14.3 13.8  HCT 42.9 41.4  MCV 92.7 93.0  MCH 30.9 31.0  MCHC 33.3 33.3  RDW 13.1 13.0  PLT 213 197   BNPNo results for input(s): BNP, PROBNP in the last 168 hours.  DDimer No results for input(s): DDIMER in the last 168 hours.   Radiology  DG Chest 2 View  Result Date: 04/23/2021 CLINICAL DATA:  Chest pain EXAM: CHEST - 2 VIEW COMPARISON:  None. FINDINGS: Cardiomegaly. Both lungs are clear. Disc degenerative disease of  the thoracic spine. IMPRESSION: Cardiomegaly without acute abnormality of the lungs. Electronically Signed   By: Delanna Ahmadi M.D.   On: 04/23/2021 13:25   CT Angio Chest PE W/Cm &/Or Wo Cm  Result Date: 04/23/2021 CLINICAL DATA:  Chest pressure. Pulmonary embolism (PE) suspected, high prob EXAM: CT ANGIOGRAPHY CHEST WITH CONTRAST TECHNIQUE: Multidetector CT imaging of the chest was performed using the standard protocol during bolus administration of intravenous contrast. Multiplanar CT image reconstructions and MIPs were obtained to evaluate the vascular anatomy. CONTRAST:  131m OMNIPAQUE IOHEXOL 350 MG/ML SOLN COMPARISON:  Same day chest x-ray FINDINGS: Cardiovascular: Satisfactory opacification of the pulmonary arteries to the segmental level. Evaluation within the lung bases is slightly degraded by respiratory motion artifact. Within this limitation there is no evidence of pulmonary embolism. Mid ascending thoracic aorta measures 4.0 cm in diameter. Atherosclerotic calcifications of the aorta and coronary arteries. Mild cardiomegaly. No pericardial effusion. Mediastinum/Nodes: No enlarged mediastinal, hilar, or axillary lymph nodes. Thyroid gland, trachea, and esophagus demonstrate no significant findings. Lungs/Pleura: Bilateral dependent opacities, likely passive atelectasis. Elsewhere, lungs are clear. No pleural effusion or pneumothorax. Upper Abdomen: Diffusely decreased attenuation of the hepatic parenchyma compatible with hepatic steatosis. No acute findings within the included upper abdomen. 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 No acute osseous abnormality. No chest wall abnormality. Review of the MIP images confirms the above findings. IMPRESSION: 1. No evidence of pulmonary embolism. 2. Dependent atelectasis bilaterally. 3. 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 Recommend annual imaging followup by CTA or MRA. This recommendation follows 2010 ACCF/AHA/AATS/ACR/ASA/SCA/SCAI/SIR/STS/SVM Guidelines for the Diagnosis and Management of Patients with Thoracic Aortic Disease. Circulation. 2010; 121:: E454-U981 Aortic aneurysm NOS (ICD10-I71.9) 4. Hepatic steatosis. 5. Aortic and coronary artery atherosclerosis (ICD10-I70.0). Electronically Signed   By: NDavina PokeD.O.   On: 04/23/2021 15:27   CT CORONARY MORPH W/CTA COR W/SCORE W/CA W/CM &/OR WO/CM  Addendum Date: 04/24/2021   ADDENDUM REPORT: 04/24/2021 15:50 HISTORY: Chest pain, nonspecific EXAM: Cardiac/Coronary CT TECHNIQUE: The patient was scanned on a [employer] PROTOCOL: A 120 kV prospective scan was triggered in the descending thoracic aorta at 111 HU's. Axial non-contrast 3 mm slices were carried out through the heart. The data set was analyzed on a dedicated work station and scored using the ASuperior Gantry rotation speed was 250 msecs and collimation was 0.6 mm.  Heart rate optimized medically, and 0.8 mg of sublingual nitroglycerin was given. The 3D data set was reconstructed in 5% intervals of 35-75% of the R-R cycle. Diastolic phases were analyzed on a dedicated work station using MPR, MIP and VRT modes. The patient received 66m OMNIPAQUE IOHEXOL 350 MG/ML SOLN of contrast. FINDINGS: Coronary calcium score: The patient's coronary artery calcium score is 848, which places the patient in the 74 percentile. Coronary arteries: Normal coronary origins.  Right dominance. Right Coronary Artery: Normal caliber vessel, gives rise to PDA. Scattered noncalcified plaque with trivial calcified plaque, with 1-24% stenosis. Left Main Coronary Artery: Normal caliber vessel. Ostial mixed calcified plaque with 1-24% stenosis. Left Anterior Descending Coronary Artery: Normal caliber vessel. Diffuse mixed calcified and noncalcified plaque throughout the proximal and mid LAD with maximum 25-49%  stenosis. Gives rise to 2 diagonal branches. Distal LAD wraps apex. Left Circumflex Artery: Normal caliber vessel. Predominantly calcified plaque in the proximal LCx with 1-24% stenosis. Scattered noncalcified plaque seen throughout remainder of proximal and mid LCx without significant stenosis. Gives rise to 2 OM branches. Aorta: Mildly enlarged size, 42 mm at the mid ascending aorta (level of the PA bifurcation) measured double oblique. Calcifications consistent with aortic atherosclerosis. No dissection seen in visualized portions of the aorta. Aortic Valve: Scattered calcifications. Trileaflet. Other findings: Normal pulmonary vein drainage into the left atrium. Normal left atrial appendage without a thrombus. Normal size of the pulmonary artery. Normal appearance of the pericardium. IMPRESSION: 1.  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 2. Coronary calcium score of 848. This was 74th percentile for age and sex matched control. 3. Normal coronary origin with right dominance. 4.  Aortic atherosclerosis 5.  Mild enlargement of ascending aorta (42 mm). INTERPRETATION: 1. CAD-RADS 0: No evidence of CAD (0%). Consider non-atherosclerotic causes of chest pain. 2. CAD-RADS 1: Minimal non-obstructive CAD (0-24%). Consider non-atherosclerotic causes of chest pain. Consider preventive therapy and risk factor modification. 3. CAD-RADS 2: Mild non-obstructive CAD (25-49%). Consider non-atherosclerotic causes of chest pain. Consider preventive therapy and risk factor modification. 4. CAD-RADS 3: Moderate stenosis (50-69%). Consider symptom-guided anti-ischemic pharmacotherapy as well as risk factor modification per guideline directed care. Additional analysis with CT FFR will be submitted. 5. CAD-RADS 4: Severe stenosis. (70-99% or > 50% left main). Cardiac catheterization or CT FFR is recommended. Consider symptom-guided anti-ischemic pharmacotherapy as well as risk factor modification per guideline directed care. Invasive  coronary angiography recommended with revascularization per published guideline statements. 6. CAD-RADS 5: Total coronary occlusion (100%). Consider cardiac catheterization or viability assessment. Consider symptom-guided anti-ischemic pharmacotherapy as well as risk factor modification per guideline directed care. 7. CAD-RADS N: Non-diagnostic study. Obstructive CAD can't be excluded. Alternative evaluation is recommended. Electronically Signed   By: BBuford DresserM.D.   On: 04/24/2021 15:50   Result Date: 04/24/2021 EXAM: OVER-READ INTERPRETATION  CT CHEST The following report is an over-read performed by radiologist Dr. DVinnie Langtonof [facility], PHavanaon 04/24/2021. This over-read does not include interpretation of cardiac or coronary anatomy or pathology. The coronary calcium score/coronary CTA interpretation by the cardiologist is attached. COMPARISON:  None. FINDINGS: Aortic atherosclerosis. Areas of ground-glass attenuation, septal thickening and architectural distortion noted in the lung bases bilaterally, concerning for potential interstitial lung disease. Within the visualized portions of the thorax there are no suspicious appearing pulmonary nodules or masses, there is no acute consolidative airspace disease, no pleural effusions, no pneumothorax and no lymphadenopathy. Visualized portions of the upper abdomen demonstrates diffuse low attenuation throughout  the visualized hepatic parenchyma, indicative of hepatic steatosis. There are no aggressive appearing lytic or blastic lesions noted in the visualized portions of the skeleton. IMPRESSION: 1. The appearance of the lung bases is concerning for probable interstitial lung disease. Further evaluation with nonemergent high-resolution chest CT is recommended in the near future to better evaluate these findings. 2.  Aortic Atherosclerosis (ICD10-I70.0). 3. Hepatic steatosis. Electronically Signed: By: Vinnie Langton M.D. On:  04/24/2021 11:32   ECHOCARDIOGRAM COMPLETE  Result Date: 04/24/2021    ECHOCARDIOGRAM REPORT   Patient Name:   Christian [name] Date of Exam: 04/24/2021 Medical Rec #:  [id number]      Height:       68.5 in Accession #:    [id number]     Weight:       228.2 lb Date of Birth:  1946/02/28       BSA:          2.173 m Patient Age:    38 years       BP:           149/73 mmHg Patient Gender: M              HR:           64 bpm. Exam Location:  Inpatient Procedure: 2D Echo, Cardiac Doppler and Color Doppler Indications:    R07.9* Chest pain, unspecified  History:        Patient has no prior history of Echocardiogram examinations.                 Risk Factors:Dyslipidemia and Hypertension.  Sonographer:    Bernadene Person RDCS Referring Phys: [place]  1. Left ventricular ejection fraction, by estimation, is 60 to 65%. The left ventricle has normal function. The left ventricle has no regional wall motion abnormalities. There is mild concentric left ventricular hypertrophy. Left ventricular diastolic parameters were normal.  2. Right ventricular systolic function is mildly reduced. The right ventricular size is normal. There is mildly elevated pulmonary artery systolic pressure. The estimated right ventricular systolic pressure is 26.8 mmHg.  3. Left atrial size was mildly dilated.  4. The mitral valve is normal in structure. No evidence of mitral valve regurgitation. No evidence of mitral stenosis.  5. The aortic valve is tricuspid. Aortic valve regurgitation is trivial. Aortic valve sclerosis is present, with no evidence of aortic valve stenosis.  6. Aortic dilatation noted. There is mild dilatation of the aortic root, measuring 40 mm. There is mild dilatation of the ascending aorta, measuring 42 mm.  7. The inferior vena cava is dilated in size with <50% respiratory variability, suggesting right atrial pressure of 15 mmHg. FINDINGS  Left Ventricle: Left ventricular ejection fraction, by  estimation, is 60 to 65%. The left ventricle has normal function. The left ventricle has no regional wall motion abnormalities. The left ventricular internal cavity size was normal in size. There is  mild concentric left ventricular hypertrophy. Left ventricular diastolic parameters were normal. Normal left ventricular filling pressure. Right Ventricle: The right ventricular size is normal. No increase in right ventricular wall thickness. Right ventricular systolic function is mildly reduced. There is mildly elevated pulmonary artery systolic pressure. The tricuspid regurgitant velocity  is 2.38 m/s, and with an assumed right atrial pressure of 15 mmHg, the estimated right ventricular systolic pressure is 34.1 mmHg. Left Atrium: Left atrial size was mildly dilated. Right Atrium: Right atrial size was normal in size. Pericardium: There is no  evidence of pericardial effusion. Mitral Valve: The mitral valve is normal in structure. No evidence of mitral valve regurgitation. No evidence of mitral valve stenosis. Tricuspid Valve: The tricuspid valve is normal in structure. Tricuspid valve regurgitation is mild . No evidence of tricuspid stenosis. Aortic Valve: The aortic valve is tricuspid. Aortic valve regurgitation is trivial. Aortic valve sclerosis is present, with no evidence of aortic valve stenosis. Pulmonic Valve: The pulmonic valve was normal in structure. Pulmonic valve regurgitation is not visualized. No evidence of pulmonic stenosis. Aorta: Aortic dilatation noted. There is mild dilatation of the aortic root, measuring 40 mm. There is mild dilatation of the ascending aorta, measuring 42 mm. Venous: The inferior vena cava is dilated in size with less than 50% respiratory variability, suggesting right atrial pressure of 15 mmHg. IAS/Shunts: No atrial level shunt detected by color flow Doppler.  LEFT VENTRICLE PLAX 2D LVIDd:         4.60 cm   Diastology LVIDs:         2.60 cm   LV e' medial:    8.51 cm/s LV PW:          1.20 cm   LV E/e' medial:  10.5 LV IVS:        1.10 cm   LV e' lateral:   8.27 cm/s LVOT diam:     2.30 cm   LV E/e' lateral: 10.8 LV SV:         77 LV SV Index:   35 LVOT Area:     4.15 cm  RIGHT VENTRICLE RV S prime:     6.80 cm/s TAPSE (M-mode): 1.8 cm LEFT ATRIUM             Index        RIGHT ATRIUM           Index LA diam:        3.90 cm 1.79 cm/m   RA Area:     14.30 cm LA Vol (A2C):   54.8 ml 25.22 ml/m  RA Volume:   25.70 ml  11.83 ml/m LA Vol (A4C):   55.3 ml 25.45 ml/m LA Biplane Vol: 57.8 ml 26.60 ml/m  AORTIC VALVE LVOT Vmax:   80.90 cm/s LVOT Vmean:  58.900 cm/s LVOT VTI:    0.185 m  AORTA Ao Root diam: 4.00 cm Ao Asc diam:  4.20 cm MITRAL VALVE               TRICUSPID VALVE MV Area (PHT): 4.21 cm    TR Peak grad:   22.7 mmHg MV Decel Time: 180 msec    TR Vmax:        238.00 cm/s MV E velocity: 89.10 cm/s MV A velocity: 69.80 cm/s  SHUNTS MV E/A ratio:  1.28        Systemic VTI:  0.18 m                            Systemic Diam: 2.30 cm Sanda Klein MD Electronically signed by Sanda Klein MD Signature Date/Time: 04/24/2021/10:12:25 AM    Final     Cardiac Studies  TTE 04/24/2021  1. Left ventricular ejection fraction, by estimation, is 60 to 65%. The  left ventricle has normal function. The left ventricle has no regional  wall motion abnormalities. There is mild concentric left ventricular  hypertrophy. Left ventricular diastolic  parameters were normal.   2. Right ventricular systolic function is mildly  reduced. The right  ventricular size is normal. There is mildly elevated pulmonary artery  systolic pressure. The estimated right ventricular systolic pressure is  56.4 mmHg.   3. Left atrial size was mildly dilated.   4. The mitral valve is normal in structure. No evidence of mitral valve  regurgitation. No evidence of mitral stenosis.   5. The aortic valve is tricuspid. Aortic valve regurgitation is trivial.  Aortic valve sclerosis is present, with no evidence of  aortic valve  stenosis.   6. Aortic dilatation noted. There is mild dilatation of the aortic root,  measuring 40 mm. There is mild dilatation of the ascending aorta,  measuring 42 mm.   7. The inferior vena cava is dilated in size with <50% respiratory  variability, suggesting right atrial pressure of 15 mmHg.   CCTA 04/24/2021 IMPRESSION: 1.  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   2. Coronary calcium score of 848. This was 74th percentile for age and sex matched control.   3. Normal coronary origin with right dominance.   4.  Aortic atherosclerosis   5.  Mild enlargement of ascending aorta (42 mm).  Patient Profile  Christian [name] is a 75 y.o. male with hypertension, obesity, OSA with CPAP who was admitted on 04/23/2021 with chest pain.  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 -EKG normal.  Echo normal with normal LV function. -Troponins were minimally elevated but I suspect related to pulmonary hypertension.  See discussion below. -We will continue aspirin 81 mg daily. -We will continue Lipitor 40 mg daily at discharge. -I will arrange follow-up with me in 6 to 8 weeks.  #Pulmonary hypertension #Interstitial lung disease -CTA has concerns for interstitial lung disease. -Echo shows RVSP 37 mmHg.  This is consistent with pulmonary hypertension. -Pulm hypertension could be related to OSA and interstitial lung disease.  Would recommend follow-up with pulmonary.  He does have a pulmonologist he follows with them. -I will also see him for nonobstructive CAD.  I will make sure pulmonary addresses his interstitial lung disease.  #Aortic ectasia -Aorta measures up to 40 mm.  Suspect this is close to normal for his age.  I will follow this as an outpatient.  [facility] will sign off.   Medication Recommendations: Aspirin 81 mg daily, Lipitor 40 mg daily Other recommendations (labs, testing, etc): Outpatient pulmonary  follow-up Follow up as an outpatient: I will arrange follow-up with me in 6 to 8 weeks postdischarge  For questions or updates, please contact [place] Please consult [email or web address] for contact info under   Time Spent with Patient: I have spent a total of 25 minutes with patient reviewing hospital notes, telemetry, EKGs, labs and examining the patient as well as establishing an assessment and plan that was discussed with the patient.  > 50% of time was spent in direct patient care.    Signed, Addison Naegeli. Audie Box, MD, [place]  04/25/2021 8:20 AM

## 2021-04-26 ENCOUNTER — Telehealth: Payer: Self-pay

## 2021-04-26 ENCOUNTER — Telehealth: Payer: Self-pay | Admitting: Internal Medicine

## 2021-04-26 NOTE — Telephone Encounter (Signed)
Transition Care Management Follow-up Telephone Call Date of discharge and from where: 04/25/2021-[facility] How have you been since you were released from the hospital? Doing great Any questions or concerns? No  Items Reviewed: Did the pt receive and understand the discharge instructions provided? Yes  Medications obtained and verified? Yes  Other? Yes  Any new allergies since your discharge? No  Dietary orders reviewed? Yes Do you have support at home? Yes   Home Care and Equipment/Supplies: Were home health services ordered? no If so, what is the name of the agency? N/a  Has the agency set up a time to come to the patient's home? not applicable Were any new equipment or medical supplies ordered?  No What is the name of the medical supply agency? N/a Were you able to get the supplies/equipment? not applicable Do you have any questions related to the use of the equipment or supplies? N/a  Functional Questionnaire: (I = Independent and D = Dependent) ADLs: I  Bathing/Dressing- I  Meal Prep- I  Eating- I  Maintaining continence- I  Transferring/Ambulation- I  Managing Meds- I  Follow up appointments reviewed:  PCP Hospital f/u appt confirmed? Yes  Scheduled to see Debbrah Alar  on 04/27/2021 @ 10:20. [facility] f/u appt confirmed? Yes  Scheduled to see Dr. Audie Box on 06/14/2021 @ 11:00 . Are transportation arrangements needed? No  If their condition worsens, is the pt aware to call PCP or go to the Emergency Dept.? Yes Was the patient provided with contact information for the PCP's office or ED? Yes Was to pt encouraged to call back with questions or concerns? Yes

## 2021-04-26 NOTE — Telephone Encounter (Signed)
I got a message from the hospitalist yesterday [facility] cardiology is concerned about interstitial lung disease.  Patient does not have high-resolution CT  Plan - Please send him an interstitial lung disease questionnaire- -Do full pulmonary function test -Do high-resolution CT chest supine and prone inspiratory expiratory images - Serum: ESR, ACE, ANA, DS-DNA, RF, anti-CCP,  ANCA screen, MPO, PR-3, Total CK,  Aldolase,  scl-70, ssA, ssB,  & Hypersensitivity Pneumonitis Panel and blood QuantiFERON gold -Given first available appointment to see me 30 minutes January 2023 is fine

## 2021-04-27 ENCOUNTER — Telehealth: Payer: Self-pay | Admitting: *Deleted

## 2021-04-27 ENCOUNTER — Ambulatory Visit (INDEPENDENT_AMBULATORY_CARE_PROVIDER_SITE_OTHER): Payer: Medicare HMO | Admitting: Family

## 2021-04-27 ENCOUNTER — Other Ambulatory Visit: Payer: Self-pay | Admitting: *Deleted

## 2021-04-27 VITALS — BP 154/68 | HR 63 | Temp 99.3°F | Resp 16 | Ht 68.0 in | Wt 230.4 lb

## 2021-04-27 DIAGNOSIS — I2510 Atherosclerotic heart disease of native coronary artery without angina pectoris: Secondary | ICD-10-CM | POA: Diagnosis not present

## 2021-04-27 DIAGNOSIS — E876 Hypokalemia: Secondary | ICD-10-CM | POA: Diagnosis not present

## 2021-04-27 DIAGNOSIS — L219 Seborrheic dermatitis, unspecified: Secondary | ICD-10-CM

## 2021-04-27 DIAGNOSIS — G4733 Obstructive sleep apnea (adult) (pediatric): Secondary | ICD-10-CM | POA: Diagnosis not present

## 2021-04-27 DIAGNOSIS — M179 Osteoarthritis of knee, unspecified: Secondary | ICD-10-CM

## 2021-04-27 DIAGNOSIS — J849 Interstitial pulmonary disease, unspecified: Secondary | ICD-10-CM

## 2021-04-27 LAB — BASIC METABOLIC PANEL
BUN: 13 mg/dL (ref 6–23)
CO2: 29 mEq/L (ref 19–32)
Calcium: 10.1 mg/dL (ref 8.4–10.5)
Chloride: 101 mEq/L (ref 96–112)
Creatinine, Ser: 0.78 mg/dL (ref 0.40–1.50)
GFR: 86.76 mL/min (ref >60.00)
Glucose, Bld: 105 mg/dL — ABNORMAL HIGH (ref 70–99)
Potassium: 4 mEq/L (ref 3.5–5.1)
Sodium: 138 mEq/L (ref 135–145)

## 2021-04-27 NOTE — Assessment & Plan Note (Signed)
Non-obstructive per cardiac CT. Continue medical management/cardiology follow up (ASA + Statin).

## 2021-04-27 NOTE — Assessment & Plan Note (Signed)
Stable, monitor.

## 2021-04-27 NOTE — Telephone Encounter (Signed)
Patient returned call, provided information per MR, he verbalized understanding.  He has a PFT scheduled for 05/28/2021 at 4 pm, advised to arrive at 3:30 pm for check in and labwork. (Labs ordered)  ILD packet mailed to patient and he will have ov with MR on 06/07/21.  Nothing further needed.

## 2021-04-27 NOTE — Patient Instructions (Signed)
Please call your pulmonology office to schedule a time for your visit on 05/28/2021. Continue taking one 81 mg Asprin daily.  If chest pain occurs again, seek medical attention in the emergency department.

## 2021-04-27 NOTE — Assessment & Plan Note (Signed)
Continue CPAP, pulmonology follow up.

## 2021-04-27 NOTE — Assessment & Plan Note (Signed)
Resolved with ketoconazole shampoo.

## 2021-04-27 NOTE — Telephone Encounter (Signed)
Attempted to call pt but unable to reach. Left message for him to return call.  Will hold off on placing all orders until we are able to speak with pt.

## 2021-04-27 NOTE — Telephone Encounter (Signed)
Patient is scheduled for full PFT 05/28/2021 and a visit on 1/26 with Dr. Chase Caller.

## 2021-04-27 NOTE — Progress Notes (Signed)
Subjective:   By signing my name below, I, Lyric Barr-McArthur, attest that this documentation has been prepared under the direction and in the presence of Debbrah Alar, NP, 04/27/2021     Patient ID: Christian [name], male    DOB: July 18, 1945, 75 y.o.   MRN: [id number]  Chief Complaint  Patient presents with   Follow-up    Here for Follow Up     HPI Patient is in today for an ED visit follow up.  Recent hospital visit: He recently visited the ED on 04/23/2021 due to chest pain. His troponin levels were mildly elevated and his heart pathway score was 6. He was recommended to be hospitalized with these findings.  He was admitted from 04/23/2021 to 04/25/2021. He had a cardiac CT performed which revealed mild non-obstructive heart disease. It was recommended to begin a baby aspirin daily and continue his statin medication. He has a follow up appointment with cardiology in February. He denies any chest pain since leaving the ED.  Lung concerns: Cardiac CT showed lung changes concerning for interstitial lung disease.   He reports he is currently on a CPAP machine for OSA and is compliant with this.  Dandruff: He notes that his dandruff was resolved with the use of medicated shampoo.  Knee pain: reports that this is improved.   Health Maintenance Due  Topic Date Due   TETANUS/TDAP  01/11/2017   Zoster Vaccines- Shingrix (2 of 2) 01/15/2021    Past Medical History:  Diagnosis Date   Back pain 06/04/2015   DVT of lower limb, acute ([place]) 01/13/2015   High blood pressure    High cholesterol    History of chicken pox 01/13/2015   Hyperlipidemia, mild 01/13/2015   Hypokalemia 01/13/2015   Obesity    Verrucous skin lesion 02/08/2016    Past Surgical History:  Procedure Laterality Date   EYE SURGERY Bilateral 02/2019   Dr Talbert Forest    Family History  Problem Relation Age of Onset   Stroke Father        from surgery   Emphysema Father        smoker   Heart disease Father        carotid  artery disease, vasculopathy   Allergic Disorder Sister     Social History   Socioeconomic History   Marital status: Married    Spouse name: Not on file   Number of children: 1   Years of education: Not on file   Highest education level: Not on file  Occupational History   Occupation: Retired  Tobacco Use   Smoking status: Never   Smokeless tobacco: Never  Substance and Sexual Activity   Alcohol use: Yes    Alcohol/week: 0.0 standard drinks    Comment: 2-3 glasses of wine every night   Drug use: No   Sexual activity: Yes    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, retired from Web designer, no dietary restrictions  Other Topics Concern   Not on file  Social History Narrative   Not on file   Social Determinants of Health   Financial Resource Strain: Not on file  Food Insecurity: Not on file  Transportation Needs: Not on file  Physical Activity: Not on file  Stress: Not on file  Social Connections: Not on file  Intimate Partner Violence: Not on file    Outpatient Medications Prior to Visit  Medication Sig Dispense Refill   hydrochlorothiazide (HYDRODIURIL) 25 MG tablet TAKE 1 TABLET BY MOUTH EVERY  DAY (Patient taking differently: Take 25 mg by mouth daily.) 90 tablet 1   losartan (COZAAR) 100 MG tablet TAKE 1 TABLET BY MOUTH EVERY DAY (Patient taking differently: Take 100 mg by mouth daily.) 90 tablet 1   Multiple Vitamin (MULTIVITAMIN) tablet Take 1 tablet by mouth daily.     Multiple Vitamins-Minerals (PRESERVISION AREDS 2 PO) Take 1 capsule by mouth 2 (two) times daily.     polyvinyl alcohol (LIQUIFILM TEARS) 1.4 % ophthalmic solution Place 1 drop into both eyes as needed for dry eyes.     pravastatin (PRAVACHOL) 40 MG tablet TAKE 1 TABLET BY MOUTH EVERY DAY (Patient taking differently: Take 40 mg by mouth daily.) 90 tablet 1   cyclobenzaprine (FLEXERIL) 5 MG tablet Take 0.5-1 tablets (2.5-5 mg total) by mouth 2 (two) times daily as needed for muscle spasms. (Patient not  taking: Reported on 04/24/2021) 30 tablet 1   No facility-administered medications prior to visit.    Allergies  Allergen Reactions   Codeine Anaphylaxis    Review of Systems  Constitutional:        (+) sleep apnea   Cardiovascular:  Negative for chest pain (resolved since ED visit.).      Objective:    Physical Exam Constitutional:      General: He is not in acute distress.    Appearance: Normal appearance. He is not ill-appearing.  HENT:     Head: Normocephalic and atraumatic.     Right Ear: External ear normal.     Left Ear: External ear normal.  Eyes:     Extraocular Movements: Extraocular movements intact.     Pupils: Pupils are equal, round, and reactive to light.  Cardiovascular:     Rate and Rhythm: Normal rate and regular rhythm.     Heart sounds: Normal heart sounds. No murmur heard.   No gallop.  Pulmonary:     Effort: Pulmonary effort is normal. No respiratory distress.     Breath sounds: Normal breath sounds. No wheezing or rales.  Skin:    General: Skin is warm and dry.  Neurological:     Mental Status: He is alert and oriented to person, place, and time.  Psychiatric:        Behavior: Behavior normal.        Judgment: Judgment normal.    BP (!) 154/68 (BP Location: Right Arm, Patient Position: Sitting, Cuff Size: Small)    Pulse 63    Temp 99.3 F (37.4 C) (Oral)    Resp 16    Ht _0  (1.727 m)    Wt 230 lb 6.4 oz (104.5 kg)    SpO2 98%    BMI 35.03 kg/m  Wt Readings from Last 3 Encounters:  04/27/21 230 lb 6.4 oz (104.5 kg)  04/25/21 226 lb 9.6 oz (102.8 kg)  08/22/20 230 lb (104.3 kg)       Assessment & Plan:   Problem List Items Addressed This Visit       Unprioritized   Seborrheic dermatitis    Resolved with ketoconazole shampoo.       Osteoarthritis of knee    Stable, monitor.       Obstructive sleep apnea    Continue CPAP, pulmonology follow up.       Hypokalemia - Primary   Relevant Orders   Basic metabolic panel    Coronary artery disease involving native heart without angina pectoris    Non-obstructive per cardiac CT. Continue medical management/cardiology follow up (ASA +  Statin).      No orders of the defined types were placed in this encounter.  31 minutes spent on today's visit. Time was spent reviewing medical record and counseling patient.  I, Debbrah Alar, NP, personally preformed the services described in this documentation.  All medical record entries made by the scribe were at my direction and in my presence.  I have reviewed the chart and discharge instructions (if applicable) and agree that the record reflects my personal performance and is accurate and complete. 04/27/2021  I,Lyric Barr-McArthur,acting as a Education administrator for Nance Pear, NP.,have documented all relevant documentation on the behalf of Nance Pear, NP,as directed by  Nance Pear, NP while in the presence of Nance Pear, NP.  Nance Pear, NP

## 2021-04-30 NOTE — Telephone Encounter (Signed)
Called and spoke with pt about MR's recs with pt needing to get CT and labwork done prior to appts. Stated to pt that PCCs would call him about the CT appt and he verbalized understanding. Stated to pt when he came for his PFT 1/16 that he could get labwork done at that time. Also stated to pt that I was going to mail a questionnaire packet for him to fill out and bring with him to his appt 1/26 with MR. Pt asked to have a mychart message sent to him about this as well which I have done. Nothing further needed.

## 2021-05-20 ENCOUNTER — Other Ambulatory Visit: Payer: Self-pay | Admitting: Family Medicine

## 2021-05-24 ENCOUNTER — Other Ambulatory Visit: Payer: Self-pay | Admitting: *Deleted

## 2021-05-24 ENCOUNTER — Ambulatory Visit
Admission: RE | Admit: 2021-05-24 | Discharge: 2021-05-24 | Disposition: A | Discharge status: Home / Self Care | Payer: Medicare HMO | Source: Ambulatory Visit | Attending: Internal Medicine | Admitting: Internal Medicine

## 2021-05-24 DIAGNOSIS — J849 Interstitial pulmonary disease, unspecified: Secondary | ICD-10-CM | POA: Diagnosis not present

## 2021-05-24 NOTE — Progress Notes (Signed)
Pft   

## 2021-05-28 ENCOUNTER — Ambulatory Visit (INDEPENDENT_AMBULATORY_CARE_PROVIDER_SITE_OTHER): Payer: Medicare HMO | Admitting: Internal Medicine

## 2021-05-28 ENCOUNTER — Other Ambulatory Visit: Payer: Self-pay

## 2021-05-28 DIAGNOSIS — J849 Interstitial pulmonary disease, unspecified: Secondary | ICD-10-CM | POA: Diagnosis not present

## 2021-05-28 LAB — PULMONARY FUNCTION TEST
DL/VA % pred: 116 %
DL/VA: 4.64 ml/min/mmHg/L
DLCO cor % pred: 97 %
DLCO cor: 22.44 ml/min/mmHg
DLCO unc % pred: 94 %
DLCO unc: 21.92 ml/min/mmHg
FEF 25-75 Post: 2.63 L/sec
FEF 25-75 Pre: 2.03 L/sec
FEF2575-%Change-Post: 29 %
FEF2575-%Pred-Post: 135 %
FEF2575-%Pred-Pre: 104 %
FEV1-%Change-Post: 6 %
FEV1-%Pred-Post: 93 %
FEV1-%Pred-Pre: 87 %
FEV1-Post: 2.54 L
FEV1-Pre: 2.38 L
FEV1FVC-%Change-Post: 4 %
FEV1FVC-%Pred-Pre: 106 %
FEV6-%Change-Post: 2 %
FEV6-%Pred-Post: 88 %
FEV6-%Pred-Pre: 87 %
FEV6-Post: 3.13 L
FEV6-Pre: 3.07 L
FEV6FVC-%Change-Post: 0 %
FEV6FVC-%Pred-Post: 106 %
FEV6FVC-%Pred-Pre: 106 %
FVC-%Change-Post: 1 %
FVC-%Pred-Post: 83 %
FVC-%Pred-Pre: 81 %
FVC-Post: 3.14 L
FVC-Pre: 3.09 L
Post FEV1/FVC ratio: 81 %
Post FEV6/FVC ratio: 100 %
Pre FEV1/FVC ratio: 77 %
Pre FEV6/FVC Ratio: 100 %
RV % pred: 53 %
RV: 1.31 L
TLC % pred: 65 %
TLC: 4.28 L

## 2021-05-28 NOTE — Progress Notes (Signed)
Full PFT Completed today

## 2021-06-07 ENCOUNTER — Encounter: Payer: Self-pay | Admitting: Internal Medicine

## 2021-06-07 ENCOUNTER — Other Ambulatory Visit: Payer: Self-pay

## 2021-06-07 ENCOUNTER — Ambulatory Visit: Payer: Medicare HMO | Admitting: Internal Medicine

## 2021-06-07 VITALS — BP 136/74 | HR 87 | Temp 98.5°F | Ht 68.0 in | Wt 232.2 lb

## 2021-06-07 DIAGNOSIS — Z8669 Personal history of other diseases of the nervous system and sense organs: Secondary | ICD-10-CM

## 2021-06-07 DIAGNOSIS — E669 Obesity, unspecified: Secondary | ICD-10-CM

## 2021-06-07 DIAGNOSIS — J849 Interstitial pulmonary disease, unspecified: Secondary | ICD-10-CM | POA: Diagnosis not present

## 2021-06-07 DIAGNOSIS — Z6835 Body mass index (BMI) 35.0-35.9, adult: Secondary | ICD-10-CM | POA: Diagnosis not present

## 2021-06-07 LAB — SEDIMENTATION RATE: Sed Rate: 51 mm/hr — ABNORMAL HIGH (ref 0–20)

## 2021-06-07 NOTE — Progress Notes (Signed)
OV 06/07/2021  Subjective:  Patient ID: Christian [name], male , DOB: Feb 21, 1946 , age 76 y.o. , MRN: [id number] , ADDRESS: [place] [id number] PCP Christian Lukes, MD Patient Care Team: Christian Lukes, MD as PCP - General (Family Medicine)  This Provider for this visit: Treatment Team:  Attending Provider: Brand Males, MD    06/07/2021 -   Chief Complaint  Patient presents with   New Patient (Initial Visit)    New patient apt. Did pft last week and turned in his ILD paperwork. Pt states no issues noted with breathing    [facility] - Dr Christian [name]  HPI Christian [name] 76 y.o. -established patient of the pulmonary practice now referred for ILD work-up.[name]  He presents with his wife Christian [name].  He has obesity with a BMI of 35.  He is on CPAP for sleep apnea.  He has essential hypertension.  He was admitted for 2 days in mid December 2022 for chest pressure.  Prior to that at baseline he was able to walk 3 miles without getting any symptoms.  CT angiogram of the chest was negative for PE and pneumonia.  But the lung parenchyma was reported as atelectasis in the presence of contrast.  He had negative cardiac biomarkers.  He had a coronary CT scan of the chest the following day on 04/24/2021 that shows early ILD.  This was followed up by a high-resolution CT scan of the chest 05/24/2021 on the outpatient setting that continues to show bilateral bibasal densities.  The radiologist has called this is not consistent with UIP or otherwise alternative to UIP diagnostic pattern.  Therefore has been referred to the ILD clinic.  He himself feels asymptomatic.  He says that he does a total of 2-3 miles per day.  He is able to walk fast.  He is only limited by knee pain.  He walks the dog does outside yard work and never feels anything.  He says in retrospect the chest pressure was all heartburn and that timing of the CT scan correlated with this.  He says this is resolved.  He says he prior to  that and subsequently he does not have any acid reflux he is not on any treatment for it.  He does use his CPAP regularly.  Towards the end of the visit they did have questions about weight loss.  We did discuss low carbohydrate diet.   [facility] Comprehensive ILD Questionnaire  Symptoms:   Current symptoms are essentially 0 obesity-BMI 35 CPAP use Knee arthritis He has had COVID-vaccine but never had COVID although he thinks right at the onset of the pandemic he might of had COVID  SYMPTOM SCALE - ILD 06/07/2021  Current weight   O2 use ra  Shortness of Breath 0 -> 5 scale with 5 being worst (score 6 If unable to do)  At rest 00  Simple tasks - showers, clothes change, eating, shaving 0  Household (dishes, doing bed, laundry) 0  Shopping 0  Walking level at own pace 0  Walking up Stairs 0  Total (30-36) Dyspnea Score 0      Non-dyspnea symptoms (0-> 5 scale) 06/07/2021  How bad is your cough? 0  How bad is your fatigue 000  How bad is nausea 0  How bad is vomiting?  00  How bad is diarrhea? 0  How bad is anxiety? 00  How bad is depression 0  Any chronic pain -  if so where and how bad Knee pain     Past Medical History :  As above  ROS:  -Chronic knee arthritis which she believes is osteoarthritis.  Denies any collagen vascular disease. -Snoring controlled by CPAP -He did have the chest pressure episode that resulted in the admission of the CT scans.  FAMILY HISTORY of LUNG DISEASE:   Father was a heavy smoker.  Had COPD but died of something else *  PERSONAL EXPOSURE HISTORY:  -Briefly tried marijuana 55 years ago and then gave it up.  No vaping no cocaine use.  No smoking cigarettes.  No intravenous drug use  HOME  EXPOSURE and HOBBY DETAILS :  -Lives in a 77 year old home.  [place]  It is in [place].  20 years ago he had a Jacuzzi but since then has not.  He just general yard work.  He has had his home  ducts are clean.  There is no mold or mildew.  Negative for any organic antigen exposure history.  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  Detail organic and inorganic antigen exposure history is negative.  PULMONARY TOXICITY HISTORY (27 items):  -Negative  INVESTIGATIONS: Most recent high-resolution CT chest 1 month after admission for chest pressure as below.  Personally visualized and also showed him  Pulmonary function test is normal.  CT Chest data - HRCT 05/24/21  Narrative & Impression  CLINICAL DATA:  Interstitial lung disease   EXAM: CT CHEST WITHOUT CONTRAST   TECHNIQUE: Multidetector CT imaging of the chest was performed following the standard protocol without intravenous contrast. High resolution imaging of the lungs, as well as inspiratory and expiratory imaging, was performed.   RADIATION DOSE REDUCTION: This exam was performed according to the departmental dose-optimization program which includes automated exposure control, adjustment of the mA and/or kV according to patient size and/or use of iterative reconstruction technique.   COMPARISON:  CT chest angiogram, 04/13/2021   FINDINGS: Cardiovascular: Aortic atherosclerosis. 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 Left coronary artery calcifications. Enlargement of the main pulmonary artery measuring up to 3.7 cm in caliber. No pericardial effusion.   Mediastinum/Nodes: No enlarged mediastinal, hilar, or axillary lymph nodes. Thyroid gland, trachea, and esophagus demonstrate no significant findings.   Lungs/Pleura: There is again seen extensive dependent bibasilar ground-glass opacity, with some associated septal thickening and possible subpleural bronchiolectasis. Mild, lobular air trapping on expiratory phase imaging. Unchanged 0.4 cm nodule of the  peripheral left upper lobe (series 12, image 128). No pleural effusion or pneumothorax.   Upper Abdomen: No acute abnormality.  Hepatic steatosis.   Musculoskeletal: No chest wall abnormality. No suspicious osseous lesions identified.   IMPRESSION: 1. There is again seen extensive dependent bibasilar ground-glass opacity, with some associated septal thickening and possible subpleural bronchiolectasis. Findings are suggestive of an alternative diagnosis (not UIP) per consensus guidelines leading differential consideration fibrotic phase NSIP, although bland post infectious or inflammatory scarring remaining a differential consideration: Diagnosis of Idiopathic Pulmonary Fibrosis: An Official ATS/ERS/JRS/ALAT Clinical Practice Guideline. [place], Iss 5, [phone], Jan 11 2017. 2. Mild, lobular air trapping on expiratory phase imaging, which may be a feature of fibrosis or related to small airways disease. 3. Unchanged 0.4 cm nodule of the peripheral left upper lobe. No follow-up needed if patient  is low-risk. Non-contrast chest CT can be considered in 12 months if patient is high-risk. This recommendation follows the consensus statement: Guidelines for Management of Incidental Pulmonary Nodules Detected on CT Images: From the Fleischner Society 2017; Radiology 2017; 284:228-243. 4. 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 Recommend annual imaging followup by CTA or MRA. This recommendation follows 2010 ACCF/AHA/AATS/ACR/ASA/SCA/SCAI/SIR/STS/SVM Guidelines for the Diagnosis and Management of Patients with Thoracic Aortic Disease. Circulation. 2010; 121: D220-U542. Aortic aneurysm NOS (ICD10-I71.9) 5. Unchanged enlargement of the main pulmonary artery measuring up to 3.7 cm in caliber, as can be seen in pulmonary hypertension. 6. Coronary artery disease. 7. Hepatic steatosis.   Aortic Atherosclerosis  (ICD10-I70.0).     Electronically Signed   By: Delanna Ahmadi M.D.   On: 05/24/2021 16:56    No results found.    PFT  PFT Results Latest Ref Rng & Units 05/28/2021  FVC-Pre L 3.09  FVC-Predicted Pre % 81  FVC-Post L 3.14  FVC-Predicted Post % 83  Pre FEV1/FVC % % 77  Post FEV1/FCV % % 81  FEV1-Pre L 2.38  FEV1-Predicted Pre % 87  FEV1-Post L 2.54  DLCO uncorrected ml/min/mmHg 21.92  DLCO UNC% % 94  DLCO corrected ml/min/mmHg 22.44  DLCO COR %Predicted % 97  DLVA Predicted % 116  TLC L 4.28  TLC % Predicted % 65  RV % Predicted % 53       has a past medical history of Back pain (06/04/2015), DVT of lower limb, acute ([place]) (01/13/2015), High blood pressure, High cholesterol, History of chicken pox (01/13/2015), Hyperlipidemia, mild (01/13/2015), Hypokalemia (01/13/2015), Obesity, and Verrucous skin lesion (02/08/2016).   reports that he has never smoked. He has never used smokeless tobacco.  Past Surgical History:  Procedure Laterality Date   EYE SURGERY Bilateral 02/2019   Dr Talbert Forest    Allergies  Allergen Reactions   Codeine Anaphylaxis    Immunization History  Administered Date(s) Administered   Fluad Quad(high Dose 65+) 02/11/2019   Influenza, High Dose Seasonal PF 02/08/2016, 02/09/2017, 02/15/2020   Influenza,inj,Quad PF,6+ Mos 02/09/2015   Influenza,inj,quad, With Preservative 03/13/2017   Influenza-Unspecified 03/13/2014, 02/11/2017, 04/05/2018, 03/02/2021   PFIZER(Purple Top)SARS-COV-2 Vaccination 06/17/2019, 07/12/2019, 02/15/2020   Pfizer Covid-19 Vaccine Bivalent Booster 47yr & up 02/07/2021   Pneumococcal Conjugate-13 03/13/2013   Pneumococcal Polysaccharide-23 04/20/2015   Pneumococcal-Unspecified 05/14/2015   Td 01/12/2007   Zoster Recombinat (Shingrix) 11/20/2020   Zoster, Live 03/13/2014    Family History  Problem Relation Age of Onset   Stroke Father        from surgery   Emphysema Father        smoker   Heart disease Father         carotid artery disease, vasculopathy   Allergic Disorder Sister      Current Outpatient Medications:    hydrochlorothiazide (HYDRODIURIL) 25 MG tablet, TAKE 1 TABLET BY MOUTH EVERY DAY (Patient taking differently: Take 25 mg by mouth daily.), Disp: 90 tablet, Rfl: 1   losartan (COZAAR) 100 MG tablet, TAKE 1 TABLET BY MOUTH EVERY DAY (Patient taking differently: Take 100 mg by mouth daily.), Disp: 90 tablet, Rfl: 1   Multiple Vitamin (MULTIVITAMIN) tablet, Take 1 tablet by mouth daily., Disp: , Rfl:    Multiple Vitamins-Minerals (PRESERVISION AREDS 2 PO), Take 1 capsule by mouth 2 (two) times daily., Disp: , Rfl:    Multiple Vitamins-Minerals (PRESERVISION AREDS 2) CAPS, Take by mouth., Disp: , Rfl:  polyvinyl alcohol (LIQUIFILM TEARS) 1.4 % ophthalmic solution, Place 1 drop into both eyes as needed for dry eyes., Disp: , Rfl:    pravastatin (PRAVACHOL) 40 MG tablet, TAKE 1 TABLET BY MOUTH EVERY DAY, Disp: 90 tablet, Rfl: 1      Objective:   Vitals:   06/07/21 0928  BP: 136/74  Pulse: 87  Temp: 98.5 F (36.9 C)  TempSrc: Oral  SpO2: 100%  Weight: 232 lb 3.2 oz (105.3 kg)  Height: _0  (1.727 m)    Estimated body mass index is 35.31 kg/m as calculated from the following:   Height as of this encounter: _1  (1.727 m).   Weight as of this encounter: 232 lb 3.2 oz (105.3 kg).  _2 @  [facility] Weights   06/07/21 0928  Weight: 232 lb 3.2 oz (105.3 kg)     Physical Exam    General: No distress. obese Neuro: Alert and Oriented x 3. GCS 15. Speech normal Psych: Pleasant Resp:  Barrel Chest - no.  Wheeze - no, Crackles - no, No overt respiratory distress CVS: Normal heart sounds. Murmurs - no Ext: Stigmata of Connective Tissue Disease - no HEENT: Normal upper airway. PEERL +. No post nasal drip        Assessment:       ICD-10-CM   1. ILD (interstitial lung disease) (HCC)  Q30.0 Cyclic citrul peptide antibody, IgG    ANCA screen with reflex titer     Anti-scleroderma antibody    Sjogren's syndrome antibods(ssa + ssb)    Hypersensitivity Pneumonitis    Angiotensin converting enzyme    ANA    Sed Rate (ESR)    Anti-DNA antibody, double-stranded    Rheumatoid Factor    Cyclic citrul peptide antibody, IgG    QuantiFERON-TB Gold Plus    Cyclic citrul peptide antibody, IgG    Rheumatoid Factor    Anti-DNA antibody, double-stranded    Sed Rate (ESR)    ANA    Angiotensin converting enzyme    Hypersensitivity Pneumonitis    Sjogren's syndrome antibods(ssa + ssb)    Anti-scleroderma antibody    ANCA screen with reflex titer    Cyclic citrul peptide antibody, IgG    Hypersensitivity Pneumonitis    Sjogren's syndrome antibods(ssa + ssb)    Anti-scleroderma antibody    Aldolase    ANCA screen with reflex titer    Rheumatoid factor    ANA,IFA RA Diag Pnl w/rflx Tit/Patn    Angiotensin converting enzyme    Sedimentation rate    2. Class 2 obesity with body mass index (BMI) of 35.0 to 35.9 in adult, unspecified obesity type, unspecified whether serious comorbidity present  E66.9    Z68.35     3. History of obstructive sleep apnea  Z86.69          Plan:     Patient Instructions     ICD-10-CM   1. ILD (interstitial lung disease) ([place])  J84.9     2. Class 2 obesity with body mass index (BMI) of 35.0 to 35.9 in adult, unspecified obesity type, unspecified whether serious comorbidity present  E66.9    Z68.35     3. History of obstructive sleep apnea  Z86.69      There is early/MILD ILD Curently asmptomatic and normal PFT Possible is reflection of recent GERD episode Glad to know that there is no active gerd   Plan  - recommend weight loss 20-30# through low carb diet  - for ILD   -  Do blood Serum: ESR, ACE, ANA, DS-DNA, RF, anti-CCP,  ANCA screen, scl-70, ssA, ssB, Hypersensitivity Pneumonitis Panel and Quantiferon Gold  - call us back if you do not hear from [name] on result  - continue cPAP - if GERD returns aim to  control it - closely monitor for gred - took shared decision about close monitoring as opposed to doing biopsy now - if problem persists/worsen will entertain biopsy starting with bronch method   Followup  4 months do spirometry and dlco - return to see Tremond Shimabukuro 30 min visit in 4 months  - symptom score and walk test at followup   ( Level 05 visit: Estb 40-54 min  in  visit type: on-site physical face to visit  in total care time and counseling or/and coordination of care by this undersigned MD - Dr Brand Males. This includes one or more of the following on this same day 06/07/2021: pre-charting, chart review, note writing, documentation discussion of test results, diagnostic or treatment recommendations, prognosis, risks and benefits of management options, instructions, education, compliance or risk-factor reduction. It excludes time spent by the [place] or office staff in the care of the patient. Actual time 50 min)    SIGNATURE    Dr. Brand Males, M.D., F.C.C.P,  Pulmonary and Critical Care Medicine Staff Physician, [place] Director - Interstitial Lung Disease  Program  Pulmonary [place], [place], [address]  Pager: [phone], If no answer or between  15:00h - 7:00h: call [phone] Telephone: [phone]  10:35 AM 06/07/2021

## 2021-06-07 NOTE — Addendum Note (Signed)
Addended by: Valerie Salts on: 06/07/2021 10:53 AM   Modules accepted: Orders

## 2021-06-07 NOTE — Patient Instructions (Addendum)
ICD-10-CM   1. ILD (interstitial lung disease) ([place])  J84.9     2. Class 2 obesity with body mass index (BMI) of 35.0 to 35.9 in adult, unspecified obesity type, unspecified whether serious comorbidity present  E66.9    Z68.35     3. History of obstructive sleep apnea  Z86.69      There is early/MILD ILD Curently asmptomatic and normal PFT Possible is reflection of recent GERD episode Glad to know that there is no active gerd   Plan  - recommend weight loss 20-30# through low carb diet  - for ILD   - Do blood Serum: ESR, ACE, ANA, DS-DNA, RF, anti-CCP,  ANCA screen, scl-70, ssA, ssB, Hypersensitivity Pneumonitis Panel and Quantiferon Gold  - call us back if you do not hear from [name] on result  - continue cPAP - if GERD returns aim to control it - closely monitor for gred - took shared decision about close monitoring as opposed to doing biopsy now - if problem persists/worsen will entertain biopsy starting with bronch method   Followup  4 months do spirometry and dlco - return to see Quintasia Theroux 30 min visit in 4 months  - symptom score and walk test at followup

## 2021-06-07 NOTE — Addendum Note (Signed)
Addended by: Monna Fam L on: 06/07/2021 10:47 AM   Modules accepted: Orders

## 2021-06-08 LAB — ALDOLASE: Aldolase: 4.3 U/L (ref <=8.1)

## 2021-06-11 LAB — HYPERSENSITIVITY PNEUMONITIS
A. Pullulans Abs: NEGATIVE
A.Fumigatus #1 Abs: NEGATIVE
Micropolyspora faeni, IgG: NEGATIVE
Pigeon Serum Abs: NEGATIVE
Thermoact. Saccharii: NEGATIVE
Thermoactinomyces vulgaris, IgG: NEGATIVE

## 2021-06-11 LAB — QUANTIFERON-TB GOLD PLUS
Mitogen-NIL: >10 IU/mL
NIL: 0.05 IU/mL
QuantiFERON-TB Gold Plus: NEGATIVE
TB1-NIL: 0 IU/mL
TB2-NIL: 0 IU/mL

## 2021-06-12 LAB — ANCA SCREEN W REFLEX TITER
ANCA Screen: POSITIVE — AB
Atypical P-ANCA titer: 1:40 {titer} — ABNORMAL HIGH

## 2021-06-12 LAB — ANTI-DNA ANTIBODY, DOUBLE-STRANDED: ds DNA Ab: 1 IU/mL

## 2021-06-12 LAB — RHEUMATOID FACTOR: Rheumatoid fact SerPl-aCnc: <14 IU/mL (ref <14)

## 2021-06-12 LAB — ANTI-SCLERODERMA ANTIBODY: Scleroderma (Scl-70) (ENA) Antibody, IgG: <1 AI

## 2021-06-12 LAB — ANA: Anti Nuclear Antibody (ANA): POSITIVE — AB

## 2021-06-12 LAB — CYCLIC CITRUL PEPTIDE ANTIBODY, IGG: Cyclic Citrullin Peptide Ab: <16 UNITS

## 2021-06-12 LAB — SJOGREN'S SYNDROME ANTIBODS(SSA + SSB)
SSA (Ro) (ENA) Antibody, IgG: <1 AI
SSB (La) (ENA) Antibody, IgG: <1 AI

## 2021-06-12 LAB — ANTI-NUCLEAR AB-TITER (ANA TITER): ANA Titer 1: 1:80 {titer} — ABNORMAL HIGH

## 2021-06-12 LAB — ANGIOTENSIN CONVERTING ENZYME: Angiotensin-Converting Enzyme: 30 U/L (ref 9–67)

## 2021-06-13 NOTE — Progress Notes (Signed)
Cardiology Office Note:   Date:  06/14/2021  NAME:  Christian [name]    MRN: [id number] DOB:  [birth year]   PCP:  Mosie Lukes, MD  Cardiologist:  None  Electrophysiologist:  None   Referring MD: Mosie Lukes, MD   Chief Complaint  Patient presents with   Follow-up        History of Present Illness:   Christian [name] is a 76 y.o. male with a hx of ILD, pHTN, non-obstructive CAD who presents for follow-up. Admitted to hospital 04/2021 for CP. Non-obstructive CAD. Found to have pHTN and ILD. Recently seen in the ER.  Nonobstructive CAD.  Found to have pulmonary hypertension and interstitial lung disease.  ANCA positive.  Awaiting final evaluation by pulmonary.  Elevated RVSP at the time of echocardiogram in the hospital.  He did have mild RV dysfunction.  We did discuss the results of his echocardiogram.  He definitely has elevated its of pulm hypertension.  No symptoms of congestive heart failure.  No chest pain or trouble breathing.  He is on an aspirin.  He is on Pravachol.  We discussed switching to Crestor.  Would like better LDL reduction.  We also discussed the results of his aorta.  We will continue to follow this yearly.  No indications for surgery.  Blood pressure 140/61.  Overall without major complaints in office.  Still awaiting evaluation by pulmonary for his recent lab work.  Problem List CAD -CAC score 848 (74th percentile) -25-49% mLAD -<25% RCA/LCX -T chol 178, HDL 52, LDL 109, triglycerides 86 2. 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 pHTN 4. ILD -ANCA+ 5. OSA  Past Medical History: Past Medical History:  Diagnosis Date   Back pain 06/04/2015   DVT of lower limb, acute ([place]) 01/13/2015   High blood pressure    High cholesterol    History of chicken pox 01/13/2015   Hyperlipidemia, mild 01/13/2015   Hypokalemia 01/13/2015   Obesity    Verrucous skin lesion 02/08/2016    Past Surgical History: Past Surgical History:  Procedure Laterality Date   EYE SURGERY  Bilateral 02/2019   Dr Talbert Forest    Current Medications: Current Meds  Medication Sig   aspirin EC 81 MG tablet Take 1 tablet (81 mg total) by mouth daily. Swallow whole.   hydrochlorothiazide (HYDRODIURIL) 25 MG tablet TAKE 1 TABLET BY MOUTH EVERY DAY (Patient taking differently: Take 25 mg by mouth daily.)   losartan (COZAAR) 100 MG tablet TAKE 1 TABLET BY MOUTH EVERY DAY (Patient taking differently: Take 100 mg by mouth daily.)   Multiple Vitamin (MULTIVITAMIN) tablet Take 1 tablet by mouth daily.   Multiple Vitamins-Minerals (PRESERVISION AREDS 2 PO) Take 1 capsule by mouth 2 (two) times daily.   Multiple Vitamins-Minerals (PRESERVISION AREDS 2) CAPS Take by mouth.   polyvinyl alcohol (LIQUIFILM TEARS) 1.4 % ophthalmic solution Place 1 drop into both eyes as needed for dry eyes.   rosuvastatin (CRESTOR) 20 MG tablet Take 1 tablet (20 mg total) by mouth daily.   [DISCONTINUED] pravastatin (PRAVACHOL) 40 MG tablet TAKE 1 TABLET BY MOUTH EVERY DAY     Allergies:    Codeine   Social History: Social History   Socioeconomic History   Marital status: Married    Spouse name: Not on file   Number of children: 1   Years of education: Not on file   Highest education level: Not on file  Occupational History   Occupation: Retired  Tobacco Use  Smoking status: Never   Smokeless tobacco: Never  Substance and Sexual Activity   Alcohol use: Yes    Alcohol/week: 0.0 standard drinks    Comment: 2-3 glasses of wine every night   Drug use: No   Sexual activity: Yes    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  ROS:   All other ROS reviewed and negative. Pertinent positives noted in the HPI.     EKGs/Labs/Other Studies Reviewed:   The following studies were personally reviewed by me today:  CCTA 04/24/2021   IMPRESSION: 1.  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   2. Coronary calcium score of 848. This was 74th percentile for age and sex matched control.   3. Normal coronary origin with right dominance.   4.  Aortic atherosclerosis   5.  Mild enlargement of ascending aorta (42 mm).  TTE 04/24/2021  1. Left ventricular ejection fraction, by estimation, is 60 to 65%. The  left ventricle has normal function. The left ventricle has no regional  wall motion abnormalities. There is mild concentric left ventricular  hypertrophy. Left ventricular diastolic  parameters were normal.   2. Right ventricular systolic function is mildly reduced. The right  ventricular size is normal. There is mildly elevated pulmonary artery  systolic pressure. The estimated right ventricular systolic pressure is  56.2 mmHg.   3. Left atrial size was mildly dilated.   4. The mitral valve is normal in structure. No evidence of mitral valve  regurgitation. No evidence of mitral stenosis.   5. The aortic valve is tricuspid. Aortic valve regurgitation is trivial.  Aortic valve sclerosis is present, with no evidence of aortic valve  stenosis.   6. Aortic dilatation noted. There is mild dilatation of the aortic root,  measuring 40 mm. There is mild dilatation of the ascending aorta,  measuring 42 mm.   7. The inferior vena cava is dilated in size with <50% respiratory  variability, suggesting right atrial pressure of 15 mmHg.    Recent Labs: 08/30/2020: TSH 2.44 04/24/2021: ALT 26; Hemoglobin 13.8; Platelets 197 04/27/2021: BUN 13; Creatinine, Ser 0.78; Potassium 4.0;  Sodium 138   Recent Lipid Panel    Component Value Date/Time   CHOL 178 04/24/2021 0129   TRIG 86 04/24/2021 0129   HDL 52 04/24/2021 0129   CHOLHDL 3.4 04/24/2021 0129   VLDL 17 04/24/2021 0129   LDLCALC 109 (H) 04/24/2021 0129    Physical Exam:   VS:  BP 140/61    Pulse 78    Ht _0  (1.727 m)    Wt 232 lb 9.6 oz (105.5 kg)    SpO2 97%    BMI 35.37 kg/m    Wt Readings from Last 3 Encounters:  06/14/21 232 lb 9.6 oz (105.5 kg)  06/07/21 232 lb 3.2 oz (105.3 kg)  04/27/21 230 lb 6.4 oz (104.5 kg)    General: Well nourished, well developed, in no acute  distress Head: Atraumatic, normal size  Eyes: PEERLA, EOMI  Neck: Supple, no JVD Endocrine: No thryomegaly Cardiac: Normal S1, S2; RRR; no murmurs, rubs, or gallops Lungs: Clear to auscultation bilaterally, no wheezing, rhonchi or rales  Abd: Soft, nontender, no hepatomegaly  Ext: No edema, pulses 2+ Musculoskeletal: No deformities, BUE and BLE strength normal and equal Skin: Warm and dry, no rashes   Neuro: Alert and oriented to person, place, time, and situation, CNII-XII grossly intact, no focal deficits  Psych: Normal mood and affect   ASSESSMENT:   Christian [name] is a 76 y.o. male who presents for the following: 1. Coronary artery disease involving native coronary artery of native heart without angina pectoris   2. Mixed hyperlipidemia   3. Pulmonary hypertension, unspecified ([place])   4. Aneurysm of ascending aorta without rupture     PLAN:   1. Coronary artery disease involving native coronary artery of native heart without angina pectoris 2. Mixed hyperlipidemia -Nonobstructive disease on recent coronary CTA.  Calcium score 848 which is 74th percentile.  Mild disease in the mid LAD's.  Continue aspirin 81 mg daily.  No symptoms of angina.  Transition off Pravachol.  Start Crestor 20 mg daily.  Goal LDL less than 70. -Echo shows normal LV function.  See discussion on pulm hypertension below.  3. Pulmonary  hypertension, unspecified ([place]) -Echo with evidence of pulm hypertension.  Now with interstitial lung disease and ANCA positive.  Awaiting final evaluation by pulmonary but I do have concerns he has primary pulmonary arterial hypertension.  ILD component could be there as well.  We will await pulmonary evaluation.  I will also reach out to discuss his case with Dr. Glori Bickers.  He may benefit from pH therapy.  4. Ascending aortic aneurysm -42 mm.  Repeat CT in 1 year.  Disposition: Return in about 1 year (around 06/14/2022).  Medication Adjustments/Labs and Tests Ordered: Current medicines are reviewed at length with the patient today.  Concerns regarding medicines are outlined above.  No orders of the defined types were placed in this encounter.  Meds ordered this encounter  Medications   rosuvastatin (CRESTOR) 20 MG tablet    Sig: Take 1 tablet (20 mg total) by mouth daily.    Dispense:  90 tablet    Refill:  3   aspirin EC 81 MG tablet    Sig: Take 1 tablet (81 mg total) by mouth daily. Swallow whole.    Dispense:  90 tablet    Refill:  3    Patient Instructions  Medication Instructions:  STOP Pravastatin  START Crestor 20 mg daily   *If you need a refill on your cardiac medications before your next appointment, please call your pharmacy*   Follow-Up: At [facility], you and your health needs are our priority.  As part of our continuing mission to provide you with exceptional heart care, we have created designated Provider Care Teams.  These Care Teams include your primary Cardiologist (physician) and Advanced Practice Providers (APPs -  Physician Assistants and Nurse Practitioners) who all work together to provide you with the care you need, when you need it.  We recommend signing up for the patient portal called "MyChart".  Sign up information is provided on this After Visit Summary.  MyChart is used to connect with patients for Virtual Visits (Telemedicine).  Patients  are able to view lab/test results, encounter notes, upcoming appointments, etc.  Non-urgent messages can be sent to your provider as well.  To learn more about what you can do with MyChart, go to [email or web address].    Your next appointment:   12 month(s)  The format for your next appointment:   In Person  Provider:   Eleonore Chiquito, MD      Time Spent with Patient: I have spent a total of 35 minutes with patient reviewing hospital notes, telemetry, EKGs, labs and examining the patient as well as establishing an assessment and plan that was discussed with the patient.  > 50% of time was spent in direct patient care.  Signed, Addison Naegeli. Audie Box, MD, [facility]  [address], [place], [place] [address] [phone]  06/14/2021 12:20 PM

## 2021-06-14 ENCOUNTER — Ambulatory Visit: Payer: Medicare HMO | Admitting: Cardiovascular Disease

## 2021-06-14 ENCOUNTER — Encounter: Payer: Self-pay | Admitting: Cardiovascular Disease

## 2021-06-14 ENCOUNTER — Other Ambulatory Visit: Payer: Self-pay

## 2021-06-14 VITALS — BP 140/61 | HR 78 | Ht 68.0 in | Wt 232.6 lb

## 2021-06-14 DIAGNOSIS — E782 Mixed hyperlipidemia: Secondary | ICD-10-CM

## 2021-06-14 DIAGNOSIS — I272 Pulmonary hypertension, unspecified: Secondary | ICD-10-CM

## 2021-06-14 DIAGNOSIS — I2510 Atherosclerotic heart disease of native coronary artery without angina pectoris: Secondary | ICD-10-CM

## 2021-06-14 DIAGNOSIS — I7121 Aneurysm of the ascending aorta, without rupture: Secondary | ICD-10-CM | POA: Diagnosis not present

## 2021-06-14 MED ORDER — ROSUVASTATIN CALCIUM 20 MG PO TABS: 20.0000 mg | ORAL_TABLET | Freq: Every day | ORAL | 3 refills | Status: DC

## 2021-06-14 MED ORDER — ASPIRIN EC 81 MG PO TBEC: 81.0000 mg | DELAYED_RELEASE_TABLET | Freq: Every day | ORAL | 3 refills | Status: DC

## 2021-06-14 NOTE — Patient Instructions (Signed)
Medication Instructions:  STOP Pravastatin  START Crestor 20 mg daily   *If you need a refill on your cardiac medications before your next appointment, please call your pharmacy*   Follow-Up: At [facility], you and your health needs are our priority.  As part of our continuing mission to provide you with exceptional heart care, we have created designated Provider Care Teams.  These Care Teams include your primary Cardiologist (physician) and Advanced Practice Providers (APPs -  Physician Assistants and Nurse Practitioners) who all work together to provide you with the care you need, when you need it.  We recommend signing up for the patient portal called "MyChart".  Sign up information is provided on this After Visit Summary.  MyChart is used to connect with patients for Virtual Visits (Telemedicine).  Patients are able to view lab/test results, encounter notes, upcoming appointments, etc.  Non-urgent messages can be sent to your provider as well.   To learn more about what you can do with MyChart, go to [email or web address].    Your next appointment:   12 month(s)  The format for your next appointment:   In Person  Provider:   Eleonore Chiquito, MD

## 2021-06-25 ENCOUNTER — Other Ambulatory Visit: Payer: Self-pay

## 2021-06-25 DIAGNOSIS — I272 Pulmonary hypertension, unspecified: Secondary | ICD-10-CM

## 2021-06-25 NOTE — Addendum Note (Signed)
Addended by: Caprice Beaver T on: 06/25/2021 05:02 PM   Modules accepted: Orders

## 2021-06-27 ENCOUNTER — Telehealth: Payer: Self-pay

## 2021-06-27 NOTE — Telephone Encounter (Signed)
Called patient, advised that we had sent the referral, and they would be contacting him to discuss the heart cath and further plans.   Patient verbalized understanding.

## 2021-06-27 NOTE — Telephone Encounter (Signed)
Called patient, advised to call back to discuss- referral sent per MD request.  Left call back number.

## 2021-06-27 NOTE — Telephone Encounter (Signed)
Patient returned your call.

## 2021-06-27 NOTE — Telephone Encounter (Signed)
-----  Message from Geralynn Rile, MD sent at 06/23/2021  9:35 AM EST ----- Regarding: RE: [facility] patient Almyra Free:  Can you call Mr. Olivar and let him know I want to proceed with the right heart catheterization? I discussed this with him in the office. I would refer him to Dr. Haroldine Laws so he can see him in the office to discuss the procedure and next steps.   -W ----- Message ----- From: Jolaine Artist, MD Sent: 06/14/2021  10:19 PM EST To: Geralynn Rile, MD Subject: RE: [facility] patient                                 Worth RHC and confirming pressures  ----- Message ----- From: Geralynn Rile, MD Sent: 06/14/2021   1:15 PM EST To: Jolaine Artist, MD Subject: [facility] patient                                     Has ILD and ANCA+. RVSP 39. Mild RV dysfunction. No symptoms. Awaiting pulm finalization of plan. You think he would benefit from therapy? No symptoms.   -Wes

## 2021-07-30 ENCOUNTER — Other Ambulatory Visit (HOSPITAL_BASED_OUTPATIENT_CLINIC_OR_DEPARTMENT_OTHER): Payer: Self-pay

## 2021-07-30 ENCOUNTER — Encounter: Payer: Self-pay | Admitting: Family Medicine

## 2021-07-30 ENCOUNTER — Ambulatory Visit (INDEPENDENT_AMBULATORY_CARE_PROVIDER_SITE_OTHER): Payer: Medicare HMO | Admitting: Family Medicine

## 2021-07-30 VITALS — BP 155/72 | HR 83 | Temp 98.3°F | Ht 68.0 in | Wt 231.6 lb

## 2021-07-30 DIAGNOSIS — J0140 Acute pansinusitis, unspecified: Secondary | ICD-10-CM

## 2021-07-30 MED ORDER — AMOXICILLIN-POT CLAVULANATE 875-125 MG PO TABS
1.0000 | ORAL_TABLET | Freq: Two times a day (BID) | ORAL | 0 refills | Status: AC
Start: 2021-07-30 — End: 2021-08-09
  Filled 2021-07-30: qty 20, 10d supply, fill #0

## 2021-07-30 NOTE — Patient Instructions (Addendum)
Start antibiotics. Education sheet added.  ?Continue supportive measures including rest, hydration, humidifier use, steam showers, warm compresses to sinuses, warm liquids with lemon and honey, and over-the-counter cough, cold, and analgesics as needed.   ? ? ?Please check your blood pressure every day or two for the next 2 weeks and send [name] a message or phone call with your home readings.  ? ? ?Over the counter medications that may be helpful for symptoms: ? ?Guaifenesin 1200 mg extended release tabs twice daily, with plenty of water ?For cough and congestion ?Brand name: Mucinex   ?Pseudoephedrine 30 mg, one or two tabs every 4 to 6 hours ?For sinus congestion ?Brand name: Sudafed ?You must get this from the pharmacy counter.  ?Oxymetazoline nasal spray each morning, one spray in each nostril, for NO MORE THAN 3 days  ?For nasal and sinus congestion ?Brand name: Afrin ?Saline nasal spray or Saline Nasal Irrigation 3-5 times a day ?For nasal and sinus congestion ?Brand names: [place] or [place] ?Fluticasone nasal spray, one spray in each nostril, each morning (after oxymetazoline and saline, if used) ?For nasal and sinus congestion ?Brand name: Flonase ?Warm salt water gargles  ?For sore throat ?Every few hours as needed ?Alternate ibuprofen 400-600 mg and acetaminophen 1000 mg every 4-6 hours ?For fever, body aches, headache ?Brand names: Motrin or Advil and Tylenol ?Dextromethorphan 12-hour cough version 30 mg every 12 hours  ?For cough ?Brand name: Delsym ?Stop all other cold medications for now (Nyquil, Dayquil, Tylenol Cold, Theraflu, etc) and other non-prescription cough/cold preparations. Many of these have the same ingredients listed above and could cause an overdose of medication.  ? ?Herbal treatments that have been shown to be helpful in some patients include: ?Vitamin C 1071m per day ?Vitamin D 4000iU per day ?Zinc 1069mper day ?Quercetin 25-50031mwice a day ?Melatonin 5-10mg at bedtime ? ?General  Instructions ?Allow your body to rest ?Drink PLENTY of fluids ? ? ?If you develop severe shortness of breath, uncontrolled fevers, coughing up blood, confusion, chest pain, or signs of dehydration (such as significantly decreased urine amounts or dizziness with standing) please go to the ER. ? ?

## 2021-07-30 NOTE — Progress Notes (Signed)
? ?Acute Office Visit ? ?Subjective:  ? ? Patient ID: Christian [name], male    DOB: 1945-11-23, 76 y.o.   MRN: [id number] ? ?CC: URI  ? ? ?URI  ?This is a new problem. The current episode started 1 to 4 weeks ago. The problem has been waxing and waning. There has been no fever. Associated symptoms include congestion, coughing, headaches, rhinorrhea and sinus pain. Pertinent negatives include no abdominal pain, chest pain, diarrhea, dysuria, ear pain, joint pain, joint swelling, nausea, neck pain, plugged ear sensation, rash, sneezing, sore throat, swollen glands, vomiting or wheezing. Treatments tried: aspirin, mucinex, nose sprays. The treatment provided no relief.  Nasal mucus is yellow/brown with postnasal drip; cough is productive, but feels like it's more so to clear the throat. He has been more tired lately.  ?No known sick contacts. ?COVID negative at home.  ? ? ?Past Medical History:  ?Diagnosis Date  ? Back pain 06/04/2015  ? DVT of lower limb, acute ([place]) 01/13/2015  ? High blood pressure   ? High cholesterol   ? History of chicken pox 01/13/2015  ? Hyperlipidemia, mild 01/13/2015  ? Hypokalemia 01/13/2015  ? Obesity   ? Verrucous skin lesion 02/08/2016  ? ? ?Past Surgical History:  ?Procedure Laterality Date  ? EYE SURGERY Bilateral 02/2019  ? Dr Talbert Forest  ? ? ?Family History  ?Problem Relation Age of Onset  ? Stroke Father   ?     from surgery  ? Emphysema Father   ?     smoker  ? Heart disease Father   ?     carotid artery disease, vasculopathy  ? Allergic Disorder Sister   ? ? ?Social History  ? ?Socioeconomic History  ? Marital status: Married  ?  Spouse name: Not on file  ? Number of children: 1  ? Years of education: Not on file  ? Highest education level: Not on file  ?Occupational History  ? Occupation: Retired  ?Tobacco Use  ? Smoking status: Never  ? Smokeless tobacco: Never  ?Substance and Sexual Activity  ? Alcohol use: Yes  ?  Alcohol/week: 0.0 standard drinks  ?  Comment: 2-3 glasses of wine every night  ?  Drug use: No  ? Sexual activity: Yes  ?  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 Not on file  ?Social History Narrative  ? Not on file  ? ?Social Determinants of Health  ? ?Financial Resource Strain: Not on file  ?Food Insecurity: Not on file  ?Transportation Needs: Not on file  ?Physical Activity: Not on file  ?Stress: Not on file  ?Social Connections: Not on file  ?Intimate Partner Violence: Not on file  ? ? ?Outpatient Medications Prior to Visit  ?Medication Sig Dispense Refill  ? aspirin EC 81 MG tablet Take 1 tablet (81 mg total) by mouth daily. Swallow whole. 90 tablet 3  ? hydrochlorothiazide (HYDRODIURIL) 25 MG tablet TAKE 1 TABLET BY MOUTH EVERY DAY (Patient taking differently: Take 25 mg by mouth daily.) 90 tablet 1  ? losartan (COZAAR) 100 MG tablet TAKE 1 TABLET BY MOUTH EVERY DAY (Patient taking differently: Take 100 mg by mouth daily.) 90 tablet 1  ? Multiple Vitamin (MULTIVITAMIN) tablet Take 1 tablet by mouth daily.    ? Multiple Vitamins-Minerals (PRESERVISION AREDS 2 PO) Take 1 capsule by mouth 2 (two) times daily.    ? Multiple Vitamins-Minerals (PRESERVISION AREDS 2) CAPS Take by  mouth.    ? polyvinyl alcohol (LIQUIFILM TEARS) 1.4 % ophthalmic solution Place 1 drop into both eyes as needed for dry eyes.    ? rosuvastatin (CRESTOR) 20 MG tablet Take 1 tablet (20 mg total) by mouth daily. 90 tablet 3  ? ?No facility-administered medications prior to visit.  ? ? ?Allergies  ?Allergen Reactions  ? Codeine Anaphylaxis  ? ? ?Review of systems: ?All review of systems negative except what is listed in the HPI ? ?   ?Objective:  ?  ?Physical Exam ?Vitals reviewed.  ?Constitutional:   ?   Appearance: Normal appearance. He is obese.  ?HENT:  ?   Head: Normocephalic and atraumatic.  ?   Right Ear: Tympanic membrane normal.  ?   Left Ear: Tympanic membrane normal.  ?   Nose: Congestion and rhinorrhea present.  ?   Mouth/Throat:  ?    Mouth: Mucous membranes are [name].  ?   Pharynx: Oropharynx is clear.  ?Eyes:  ?   Conjunctiva/sclera: Conjunctivae normal.  ?Cardiovascular:  ?   Rate and Rhythm: Normal rate and regular rhythm.  ?Pulmonary:  ?   Effort: Pulmonary effort is normal.  ?   Breath sounds: Normal breath sounds. No wheezing, rhonchi or rales.  ?Musculoskeletal:  ?   Cervical back: Normal range of motion and neck supple. No tenderness.  ?Lymphadenopathy:  ?   Cervical: No cervical adenopathy.  ?Neurological:  ?   General: No focal deficit present.  ?   Mental Status: He is alert and oriented to person, place, and time. Mental status is at baseline.  ?Psychiatric:     ?   Mood and Affect: Mood normal.     ?   Behavior: Behavior normal.     ?   Thought Content: Thought content normal.     ?   Judgment: Judgment normal.  ? ? ? ? ? ? ?BP (!) 155/72   Pulse 83   Temp 98.3 ?F (36.8 ?C)   Ht 5' 8" (1.727 m)   Wt 231 lb 9.6 oz (105.1 kg)   SpO2 97%   BMI 35.21 kg/m?  ?Wt Readings from Last 3 Encounters:  ?07/30/21 231 lb 9.6 oz (105.1 kg)  ?06/14/21 232 lb 9.6 oz (105.5 kg)  ?06/07/21 232 lb 3.2 oz (105.3 kg)  ? ? ?Health Maintenance Due  ?Topic Date Due  ? TETANUS/TDAP  01/11/2017  ? Zoster Vaccines- Shingrix (2 of 2) 01/15/2021  ? ? ?There are no preventive care reminders to display for this patient. ? ? ?Lab Results  ?Component Value Date  ? TSH 2.44 08/30/2020  ? ?Lab Results  ?Component Value Date  ? WBC 9.0 04/24/2021  ? HGB 13.8 04/24/2021  ? HCT 41.4 04/24/2021  ? MCV 93.0 04/24/2021  ? PLT 197 04/24/2021  ? ?Lab Results  ?Component Value Date  ? NA 138 04/27/2021  ? K 4.0 04/27/2021  ? CO2 29 04/27/2021  ? GLUCOSE 105 (H) 04/27/2021  ? BUN 13 04/27/2021  ? CREATININE 0.78 04/27/2021  ? BILITOT 0.8 04/24/2021  ? ALKPHOS 61 04/24/2021  ? AST 23 04/24/2021  ? ALT 26 04/24/2021  ? PROT 7.0 04/24/2021  ? ALBUMIN 3.5 04/24/2021  ? CALCIUM 10.1 04/27/2021  ? ANIONGAP 10 04/24/2021  ? GFR 86.76 04/27/2021  ? ?Lab Results  ?Component Value  Date  ? CHOL 178 04/24/2021  ? ?Lab Results  ?Component Value Date  ? HDL 52 04/24/2021  ? ?Lab Results  ?Component Value Date  ?  LDLCALC 109 (H) 04/24/2021  ? ?Lab Results  ?Component Value Date  ? TRIG 86 04/24/2021  ? ?Lab Results  ?Component Value Date  ? CHOLHDL 3.4 04/24/2021  ? ?Lab Results  ?Component Value Date  ? HGBA1C 5.8 08/30/2020  ? ? ?   ?Assessment & Plan:  ? ?1. Acute non-recurrent pansinusitis ?Start antibiotics. Education sheet added to AVS.  ?Continue supportive measures including rest, hydration, humidifier use, steam showers, warm compresses to sinuses, warm liquids with lemon and honey, and over-the-counter cough, cold, and analgesics as needed.   ?Patient aware of signs/symptoms requiring further/urgent evaluation. ? ?Please check your blood pressure every day or two for the next 2 weeks and send [name] a message or phone call with your home readings.  ? ? ? ?- amoxicillin-clavulanate (AUGMENTIN) 875-125 MG tablet; Take 1 tablet by mouth 2 (two) times daily for 10 days.  Dispense: 20 tablet; Refill: 0 ? ? ? ?Please contact office for follow-up if symptoms do not improve or worsen. Seek emergency care if symptoms become severe. ? ? ? ?Terrilyn Saver, NP ? ?

## 2021-09-03 DIAGNOSIS — G4733 Obstructive sleep apnea (adult) (pediatric): Secondary | ICD-10-CM | POA: Diagnosis not present

## 2021-09-09 ENCOUNTER — Other Ambulatory Visit: Payer: Self-pay | Admitting: Family Medicine

## 2021-09-10 ENCOUNTER — Other Ambulatory Visit: Payer: Self-pay | Admitting: Family Medicine

## 2021-09-19 ENCOUNTER — Other Ambulatory Visit: Payer: Self-pay | Admitting: Internal Medicine

## 2021-09-19 DIAGNOSIS — J849 Interstitial pulmonary disease, unspecified: Secondary | ICD-10-CM

## 2021-09-20 ENCOUNTER — Encounter: Payer: Self-pay | Admitting: Internal Medicine

## 2021-09-20 ENCOUNTER — Ambulatory Visit: Payer: Medicare HMO | Admitting: Internal Medicine

## 2021-09-20 ENCOUNTER — Ambulatory Visit (INDEPENDENT_AMBULATORY_CARE_PROVIDER_SITE_OTHER): Payer: Medicare HMO | Admitting: Internal Medicine

## 2021-09-20 VITALS — BP 128/80 | HR 71 | Temp 99.2°F | Ht 67.5 in | Wt 236.6 lb

## 2021-09-20 DIAGNOSIS — J849 Interstitial pulmonary disease, unspecified: Secondary | ICD-10-CM

## 2021-09-20 LAB — PULMONARY FUNCTION TEST
DL/VA % pred: 100 %
DL/VA: 4.01 ml/min/mmHg/L
DLCO cor % pred: 84 %
DLCO cor: 19.44 ml/min/mmHg
DLCO unc % pred: 84 %
DLCO unc: 19.44 ml/min/mmHg
FEF 25-75 Pre: 1.65 L/sec
FEF2575-%Pred-Pre: 84 %
FEV1-%Pred-Pre: 80 %
FEV1-Pre: 2.2 L
FEV1FVC-%Pred-Pre: 105 %
FEV6-%Pred-Pre: 81 %
FEV6-Pre: 2.86 L
FEV6FVC-%Pred-Pre: 106 %
FVC-%Pred-Pre: 76 %
FVC-Pre: 2.89 L
Pre FEV1/FVC ratio: 76 %
Pre FEV6/FVC Ratio: 99 %

## 2021-09-20 NOTE — Progress Notes (Signed)
Spirometry and Dlco done today. ?

## 2021-09-20 NOTE — Patient Instructions (Addendum)
ICD-10-CM   ?1. ILD (interstitial lung disease) ([place])  J84.9   ?  ?2. Class 2 obesity with body mass index (BMI) of 35.0 to 35.9 in adult, unspecified obesity type, unspecified whether serious comorbidity present  E66.9   ? Z68.35   ?  ?3. History of obstructive sleep apnea  Z86.69   ?  ? ?There is early/MILD ILD ?Curently asmptomatic and normal PFT thought PFT is lower end of normal compared to before ?Mild positive ANA antibody - unclear significance ? ? ?Plan (based on shared decision) ? - recommend weight loss 20-30# through low carb diet ? - no biopsy  ?- continued monitorng needed esp for progressipn ?  - do spirometry and dlco in 3 months ?- avoid getting respiratyr illness and GERD ? ?Followup ? 3 months do spirometry and dlco ?- return to see Addalynn Kumari 30 min visit in 4 months ? - symptom score and walk test at followup ? ?

## 2021-09-20 NOTE — Progress Notes (Signed)
? ? ? ?OV 06/07/2021 ? ?Subjective:  ?Patient ID: Christian [name], male , DOB: 09-24-1945 , age 76 y.o. , MRN: [id number] , ADDRESS: [address] ?[place] [address] ?PCP Mosie Lukes, MD ?Patient Care Team: ?Mosie Lukes, MD as PCP - General (Family Medicine) ? ?This Provider for this visit: Treatment Team:  ?Attending Provider: Brand Males, MD ? ? ? ?06/07/2021 -   ?Chief Complaint  ?Patient presents with  ? New Patient (Initial Visit)  ?  New patient apt. Did pft last week and turned in his ILD paperwork. Pt states no issues noted with breathing   ? ?OSA - Dr Annamaria Boots ? ?HPI ?Christian [name] 76 y.o. -established patient of the pulmonary practice now referred for ILD work-up.[name]  He presents with his wife [name].  He has obesity with a BMI of 35.  He is on CPAP for sleep apnea.  He has essential hypertension.  He was admitted for 2 days in mid December 2022 for chest pressure.  Prior to that at baseline he was able to walk 3 miles without getting any symptoms.  CT angiogram of the chest was negative for PE and pneumonia.  But the lung parenchyma was reported as atelectasis in the presence of contrast.  He had negative cardiac biomarkers.  He had a coronary CT scan of the chest the following day on 04/24/2021 that shows early ILD.  This was followed up by a high-resolution CT scan of the chest 05/24/2021 on the outpatient setting that continues to show bilateral bibasal densities.  The radiologist has called this is not consistent with UIP or otherwise alternative to UIP diagnostic pattern.  Therefore has been referred to the ILD clinic.  He himself feels asymptomatic.  He says that he does a total of 2-3 miles per day.  He is able to walk fast.  He is only limited by knee pain.  He walks the dog does outside yard work and never feels anything.  He says in retrospect the chest pressure was all heartburn and that timing of the CT scan correlated with this.  He says this is resolved.  He says he prior to  that and subsequently he does not have any acid reflux he is not on any treatment for it.  He does use his CPAP regularly. ? ?Towards the end of the visit they did have questions about weight loss.  We did discuss low carbohydrate diet. ? ? ? Integrated Comprehensive ILD Questionnaire ? ?Symptoms:  ? ?Current symptoms are essentially 0 obesity-BMI 35 ?CPAP use ?Knee arthritis ?He has had COVID-vaccine but never had COVID although he thinks right at the onset of the pandemic he might of had COVID ? ? ? ? ?Past Medical History :  ?As above ? ?ROS:  ?-Chronic knee arthritis which she believes is osteoarthritis.  Denies any collagen vascular disease. ?-Snoring controlled by CPAP ?-He did have the chest pressure episode that resulted in the admission of the CT scans. ? ?FAMILY HISTORY of LUNG DISEASE:  ? Father was a heavy smoker.  Had COPD but died of something else ?* ? ?PERSONAL EXPOSURE HISTORY:  ?-Briefly tried marijuana 55 years ago and then gave it up.  No vaping no cocaine use.  No smoking cigarettes.  No intravenous drug use ? ?HOME  EXPOSURE and HOBBY DETAILS :  ?-Lives in a 77 year old home.  [place]  [place] in [place].  20 years ago he  had a Jacuzzi but since then has not.  He just general yard work.  He has had his home ducts are clean.  There is no mold or mildew.  Negative for any organic antigen exposure history. ? 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  Detail organic and inorganic antigen exposure history is negative. ? ?PULMONARY TOXICITY HISTORY (27 items):  ?-Negative ? ?INVESTIGATIONS: ?Most recent high-resolution CT chest 1 month after admission for chest pressure as below.  Personally visualized and also showed him ? ?Pulmonary function test is normal. ? ?CT Chest data - HRCT 05/24/21 ? ?Narrative & Impression  ?CLINICAL DATA:  Interstitial lung disease ?   ?EXAM: ?CT CHEST WITHOUT CONTRAST ?  ?TECHNIQUE: ?Multidetector CT imaging of the chest was performed following the ?standard protocol without intravenous contrast. High resolution ?imaging of the lungs, as well as inspiratory and expiratory imaging, ?was performed. ?  ?RADIATION DOSE REDUCTION: This exam was performed according to the ?departmental dose-optimization program which includes automated ?exposure control, adjustment of the mA and/or kV according to ?patient size and/or use of iterative reconstruction technique. ?  ?COMPARISON:  CT chest angiogram, 04/13/2021 ?  ?FINDINGS: ?Cardiovascular: Aortic atherosclerosis. Unchanged enlargement of the ?tubular ascending thoracic aorta, measuring up to 4.2 x 4.2 cm ?cardiomegaly. Left coronary artery calcifications. Enlargement of ?the main pulmonary artery measuring up to 3.7 cm in caliber. No ?pericardial effusion. ?  ?Mediastinum/Nodes: No enlarged mediastinal, hilar, or axillary lymph ?nodes. Thyroid gland, trachea, and esophagus demonstrate no ?significant findings. ?  ?Lungs/Pleura: There is again seen extensive dependent bibasilar ?ground-glass opacity, with some associated septal thickening and ?possible subpleural bronchiolectasis. Mild, lobular air trapping on ?expiratory phase imaging. Unchanged 0.4 cm nodule of the peripheral ?left upper lobe (series 12, image 128). No pleural effusion or ?pneumothorax. ?  ?Upper Abdomen: No acute abnormality.  Hepatic steatosis. ?  ?Musculoskeletal: No chest wall abnormality. No suspicious osseous ?lesions identified. ?  ?IMPRESSION: ?1. There is again seen extensive dependent bibasilar ground-glass ?opacity, with some associated septal thickening and possible ?subpleural bronchiolectasis. Findings are suggestive of an ?alternative diagnosis (not UIP) per consensus guidelines leading ?differential consideration fibrotic phase NSIP, although bland post ?infectious or inflammatory scarring remaining a  differential ?consideration: Diagnosis of Idiopathic Pulmonary Fibrosis: An ?Official ATS/ERS/JRS/ALAT Clinical Practice Guideline. Am J Respir ?[place], Iss 5, [id number], Jan 11 2017. ?2. Mild, lobular air trapping on expiratory phase imaging, which may ?be a feature of fibrosis or related to small airways disease. ?3. Unchanged 0.4 cm nodule of the peripheral left upper lobe. No ?follow-up needed if patient is low-risk. Non-contrast chest CT can ?be considered in 12 months if patient is high-risk. This ?recommendation follows the consensus statement: Guidelines for ?Management of Incidental Pulmonary Nodules Detected on CT Images: ?From the Fleischner Society 2017; Radiology 2017; 284:228-243. ?4. Unchanged enlargement of the tubular ascending thoracic aorta, ?measuring up to 4.2 x 4.2 cm. Recommend annual imaging followup by ?CTA or MRA. This recommendation follows 2010 ?ACCF/AHA/AATS/ACR/ASA/SCA/SCAI/SIR/STS/SVM Guidelines for the ?Diagnosis and Management of Patients with Thoracic Aortic Disease. ?Circulation. 2010; 121: O175-Z025. Aortic aneurysm NOS (ICD10-I71.9) ?5. Unchanged enlargement of the main pulmonary artery measuring up ?to 3.7 cm in caliber, as can be seen in pulmonary hypertension. ?6. Coronary artery disease. ?7. Hepatic steatosis. ?  ?Aortic Atherosclerosis (ICD10-I70.0). ?  ?  ?Electronically Signed ?  By: Delanna Ahmadi M.D. ?  On: 05/24/2021 16:56  ? ? ?  No results found. ? ? ? ?PFT ? ? ?OV 09/20/2021 ? ?Subjective:  ?Patient ID: Christian [name], male , DOB: 1946-03-26 , age 33 y.o. , MRN: [id number] , AD[address]ESS: [address] ?[place] [address] ?PCP Mosie Lukes, MD ?Patient Care Team: ?Mosie Lukes, MD as PCP - General (Family Medicine) ? ?This Provider for this visit: Treatment Team:  ?Attending Provider: Brand Males, MD ? ? ? ?09/20/2021 -   ?Chief Complaint  ?Patient presents with  ? Follow-up  ?  Pt states he has been doing okay since last visit and denies any  complaints.  ? ?ILD - mild/early, On followup baed o shared decision makng ? ?HPI ?Nikolai A Civello 76 y.o. -returns for follow-up.  He has no complaints.  Continues to be essentially asymptomatic.  Continues his daily walks.  Hi

## 2021-10-15 ENCOUNTER — Encounter: Payer: Medicare HMO | Admitting: Family Medicine

## 2021-11-14 ENCOUNTER — Telehealth (HOSPITAL_COMMUNITY): Payer: Self-pay | Admitting: Vascular Surgery

## 2021-11-14 DIAGNOSIS — L821 Other seborrheic keratosis: Secondary | ICD-10-CM | POA: Diagnosis not present

## 2021-11-14 DIAGNOSIS — L218 Other seborrheic dermatitis: Secondary | ICD-10-CM | POA: Diagnosis not present

## 2021-11-14 DIAGNOSIS — C44719 Basal cell carcinoma of skin of left lower limb, including hip: Secondary | ICD-10-CM | POA: Diagnosis not present

## 2021-11-14 DIAGNOSIS — L570 Actinic keratosis: Secondary | ICD-10-CM | POA: Diagnosis not present

## 2021-11-14 DIAGNOSIS — Z85828 Personal history of other malignant neoplasm of skin: Secondary | ICD-10-CM | POA: Diagnosis not present

## 2021-11-14 NOTE — Telephone Encounter (Signed)
Left VM to make new chf appt w/ db or Mclean first  ava

## 2021-11-23 ENCOUNTER — Ambulatory Visit (HOSPITAL_COMMUNITY)
Admission: RE | Admit: 2021-11-23 | Discharge: 2021-11-23 | Disposition: A | Discharge status: Home / Self Care | Payer: Medicare HMO | Source: Ambulatory Visit | Attending: Cardiology | Admitting: Cardiology

## 2021-11-23 ENCOUNTER — Encounter (HOSPITAL_COMMUNITY): Payer: Self-pay | Admitting: Cardiology

## 2021-11-23 VITALS — BP 160/80 | HR 81 | Wt 235.8 lb

## 2021-11-23 DIAGNOSIS — J8489 Other specified interstitial pulmonary diseases: Secondary | ICD-10-CM | POA: Insufficient documentation

## 2021-11-23 DIAGNOSIS — I272 Pulmonary hypertension, unspecified: Secondary | ICD-10-CM

## 2021-11-23 DIAGNOSIS — E785 Hyperlipidemia, unspecified: Secondary | ICD-10-CM | POA: Diagnosis not present

## 2021-11-23 DIAGNOSIS — Z79899 Other long term (current) drug therapy: Secondary | ICD-10-CM | POA: Insufficient documentation

## 2021-11-23 DIAGNOSIS — I10 Essential (primary) hypertension: Secondary | ICD-10-CM | POA: Insufficient documentation

## 2021-11-23 DIAGNOSIS — G4733 Obstructive sleep apnea (adult) (pediatric): Secondary | ICD-10-CM | POA: Diagnosis not present

## 2021-11-23 DIAGNOSIS — Z7901 Long term (current) use of anticoagulants: Secondary | ICD-10-CM | POA: Diagnosis not present

## 2021-11-23 DIAGNOSIS — I2510 Atherosclerotic heart disease of native coronary artery without angina pectoris: Secondary | ICD-10-CM | POA: Diagnosis not present

## 2021-11-23 DIAGNOSIS — Z9989 Dependence on other enabling machines and devices: Secondary | ICD-10-CM | POA: Insufficient documentation

## 2021-11-23 DIAGNOSIS — I493 Ventricular premature depolarization: Secondary | ICD-10-CM | POA: Diagnosis not present

## 2021-11-23 DIAGNOSIS — Z7982 Long term (current) use of aspirin: Secondary | ICD-10-CM | POA: Insufficient documentation

## 2021-11-23 LAB — LIPID PANEL
Cholesterol: 141 mg/dL (ref 0–200)
HDL: 47 mg/dL (ref >40)
LDL Cholesterol: 70 mg/dL (ref 0–99)
Total CHOL/HDL Ratio: 3 RATIO
Triglycerides: 119 mg/dL (ref <150)
VLDL: 24 mg/dL (ref 0–40)

## 2021-11-23 LAB — CBC
HCT: 42.2 % (ref 39.0–52.0)
Hemoglobin: 14 g/dL (ref 13.0–17.0)
MCH: 31.5 pg (ref 26.0–34.0)
MCHC: 33.2 g/dL (ref 30.0–36.0)
MCV: 94.8 fL (ref 80.0–100.0)
Platelets: 196 10*3/uL (ref 150–400)
RBC: 4.45 MIL/uL (ref 4.22–5.81)
RDW: 13.2 % (ref 11.5–15.5)
WBC: 6.1 10*3/uL (ref 4.0–10.5)
nRBC: 0 % (ref 0.0–0.2)

## 2021-11-23 LAB — COMPREHENSIVE METABOLIC PANEL
ALT: 33 U/L (ref 0–44)
AST: 32 U/L (ref 15–41)
Albumin: 4 g/dL (ref 3.5–5.0)
Alkaline Phosphatase: 64 U/L (ref 38–126)
Anion gap: 13 (ref 5–15)
BUN: 10 mg/dL (ref 8–23)
CO2: 25 mmol/L (ref 22–32)
Calcium: 9.7 mg/dL (ref 8.9–10.3)
Chloride: 101 mmol/L (ref 98–111)
Creatinine, Ser: 0.79 mg/dL (ref 0.61–1.24)
GFR, Estimated: >60 mL/min (ref >60)
Glucose, Bld: 117 mg/dL — ABNORMAL HIGH (ref 70–99)
Potassium: 3.6 mmol/L (ref 3.5–5.1)
Sodium: 139 mmol/L (ref 135–145)
Total Bilirubin: 1 mg/dL (ref 0.3–1.2)
Total Protein: 7.6 g/dL (ref 6.5–8.1)

## 2021-11-23 MED ORDER — HYDROCHLOROTHIAZIDE 50 MG PO TABS: 50.0000 mg | ORAL_TABLET | Freq: Every day | ORAL | 6 refills | Status: DC

## 2021-11-23 NOTE — Patient Instructions (Signed)
Increase HCTZ 50 mg Daily  Labs done today, your results will be available in MyChart, we will contact you for abnormal readings.  Restrict your sodium intake to less than 2030m per day. This will help prevent your body from holding onto fluid. Read food labels as a lot of canned and packaged foods have a lot of sodium.  Heart Cath on Tue 12/04/21, see instructions below  Your physician recommends that you schedule a follow-up appointment in: 4-6 weeks  If you have any questions or concerns before your next appointment please send [place] message through [place] call our office at [phone]    TO LEAVE A MESSAGE FOR THE NURSE SELECT OPTION 2, PLEASE LEAVE A MESSAGE INCLUDING: YOUR NAME DATE OF BIRTH CALL BACK NUMBER REASON FOR CALL**this is important as we prioritize the call backs  YOU WILL RECEIVE A CALL BACK THE SAME DAY AS LONG AS YOU CALL BEFORE 4:00 PM  At the [facility] you and your health needs are our priority. As part of our continuing mission to provide you with exceptional heart care, we have created designated Provider Care Teams. These Care Teams include your primary Cardiologist (physician) and Advanced Practice Providers (APPs- Physician Assistants and Nurse Practitioners) who all work together to provide you with the care you need, when you need it.   You may see any of the following providers on your designated Care Team at your next follow up: Dr DGlori BickersDr DHaynes Kerns NP BLyda Jester [facility] [place]udry Riles PharmD   Please be sure to bring in all your medications bottles to every appointment.    CATHETERIZATION INSTRUCTIONS  You are scheduled for a Cardiac Catheterization on Tuesday, July 25 with Dr. DLoralie Champagne  1. Please arrive at the Main [facility] at [facility] [address] [id number] 8:30 AM  Free valet parking service is available.    Special note: Every effort is made to have your procedure done on time. Please understand that emergencies sometimes delay scheduled procedures.  2. Diet: Do not eat solid foods after midnight.  You may have clear liquids until 5 AM upon the day of the procedure.  3. Labs: DONE TODAY  4. Medication instructions in preparation for your procedure:   TUESDAY 7/25 AM DO NOT TAKE HCTZ  On the morning of your procedure, take Aspirin and any morning medicines NOT listed above.  You may use sips of water.  5. Plan to go home the same day, you will only stay overnight if medically necessary. 6. You MUST have a responsible adult to drive you home. 7. An adult MUST be with you the first 24 hours after you arrive home. 8. Bring a current list of your medications, and the last time and date medication taken. 9. Bring ID and current insurance cards. 10.Please wear clothes that are easy to get on and off and wear slip-on shoes.  Thank you for allowing [facility] care for you!   --  Invasive Cardiovascular services

## 2021-11-24 NOTE — Progress Notes (Addendum)
PCP: Blyth, Stacey A, MD Pulmonology: Dr. Ramaswamy HF Cardiology: Dr. Juddson Cobern  76 y.o. with history of HTN and OSA was found to have interstitial lung disease and was referred for workup of possible pulmonary hypertension by Dr. Ramaswamy.  Patient was generally doing well with minimal medical problems until 12/22.  At that time, he was hospitalized with chest pain.  Coronary CT was done during 12/22 admission showing nonobstructive CAD, but interstitial lung disease was incidentally noted on the CT.  Patient had an echo showing EF 60-65%, mild LVH, mildly decreased RV systolic function, PASP 38 mmHg, IVC dilated.  He was referred to Dr. 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  Autoimmune serologies were weakly positive for ANA and P-ANCA. Patient has not started anti-fibrotic meds at this point, plan for watchful waiting.  He was referred here due to at least mild pulmonary hypertension by echo with mildly decreased RV function.   Patient has been generally doing well.  No further chest pain. He walks 2-3 miles/day with no exertional dyspnea.  He seems to be minimally symptomatic from ILD.  Main complaint is knee pain, especially when walking up stairs.  No palpitations or lightheadedness.  BP has been running high at home and is high in the office today.   ECG (personally reviewed): NSR, 1st degree AVB, PVC  Labs (12/22): K 4, creatinine 0.78, LDL 109  PMH: 1. OSA: CPAP 2. HTN 3. Hyperlipidemia 4. Ascending aortic aneurysm: 4.2 cm ascending aorta on 12/22 CT chest.  5. Interstitial lung disease: This was noted on high resolution CT chest in 1/23.  NSIP pattern.   - RF-, SCL-70-, SSA/B-, ESR 51, ANA 1:80, P-ANCA 1:40 - Echo (12/22): EF 60-65%, mild LVH, mildly decreased RV systolic function, PASP 38 mmHg, IVC dilated.  6. CAD: Coronary CTA (12/22) with calcium score 848 Agatston units (74th percentile), mild nonobstructive CAD.  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, retired from advertising agency owner, no dietary restrictions  Other Topics Concern   Not on file  Social History Narrative   Not on file   Social Determinants of Health   Financial Resource Strain: Not on file  Food Insecurity: Not on file  Transportation Needs: Not on file  Physical Activity: Not on file  Stress: Not on file  Social Connections: Not on file  Intimate Partner Violence: Not on file   Family History  Problem Relation Age of Onset   Stroke Father        from surgery   Emphysema Father        smoker   Heart disease Father        carotid artery disease, vasculopathy   Allergic Disorder Sister    ROS: All systems reviewed and negative except as per HPI.   Current Outpatient Medications  Medication Sig Dispense Refill   aspirin EC 81   MG tablet Take 1 tablet (81 mg total) by mouth daily. Swallow whole. 90 tablet 3   losartan (COZAAR) 100 MG tablet TAKE 1 TABLET BY MOUTH EVERY DAY 90 tablet 1   Multiple Vitamin (MULTIVITAMIN) tablet Take 1 tablet by mouth daily.     Multiple Vitamins-Minerals (PRESERVISION AREDS 2 PO) Take 1 capsule by mouth 2 (two) times daily.     polyvinyl alcohol (LIQUIFILM TEARS) 1.4 % ophthalmic solution Place 1 drop into both eyes as needed for dry eyes.     rosuvastatin (CRESTOR) 20 MG tablet Take 1 tablet (20 mg total) by mouth daily. 90 tablet 3   hydrochlorothiazide (HYDRODIURIL) 50 MG tablet Take 1 tablet (50 mg total) by mouth  daily. 30 tablet 6   No current facility-administered medications for this encounter.   BP (!) 160/80   Pulse 81   Wt 107 kg (235 lb 12.8 oz)   SpO2 96%   BMI 36.39 kg/m  General: NAD Neck: No JVD, no thyromegaly or thyroid nodule.  Lungs: Clear to auscultation bilaterally with normal respiratory effort. CV: Nondisplaced PMI.  Heart regular S1/S2, no S3/S4, no murmur.  No peripheral edema.  No carotid bruit.  Normal pedal pulses.  Abdomen: Soft, nontender, no hepatosplenomegaly, no distention.  Skin: Intact without lesions or rashes.  Neurologic: Alert and oriented x 3.  Psych: Normal affect. Extremities: No clubbing or cyanosis.  HEENT: Normal.   Assessment/Plan: 1. Interstitial lung disease: Noted by coronary CTA and confirmed on HRCT in 1/23.  NSIP pattern, weakly positive ANA and P-ANCA.  Has seen Dr. Chase Caller, plan for now is watchful waiting (have not started anti-fibrotic meds).   2. Coronary artery disease: Hospitalized with noncardiac chest pain in 12/22.  Coronary CT with calcium score 848 Agatston units and mild nonobstructive CAD.  - He is on ASA 81 daily.  - Rosuvastatin increased to 20 mg daily several months ago.  Check lipids/LFTs today.  3. OSA: Continue CPAP.  4. HTN: BP has been running high.  - Continue losartan 100 mg daily.  - Increase HCTZ to 50 mg daily, BMET today and in 10 days.  - If BP remains high with this change, add amlodipine.  5. ?Pulmonary hypertension: Echo in 12/22 showed mild pulmonary hypertension (PASP 38 mmHg) with mild RV dilation. Patient does have interstitial lung disease.  He may have mild pulmonary hypertension due to OSA.  However, he could also have pulmonary hypertension related to interstitial lung disease (PH-ILD).   - I recommended RHC to sort out the above.  We discussed risks/benefits and patient agrees to procedure.  - If he has only mild PH, would rule out chronic PE by V/Q scan but likely avoid selective treatments as OSA  could be the cause.  - If he has more significant PH by RHC, would rule out chronic PE by V/Q scan and then consider treatment with Tyvaso DPE for PH-ILD.    [name] 11/24/2021

## 2021-11-24 NOTE — H&P (View-Only) (Signed)
PCP: Christian Lukes, MD Pulmonology: Dr. Chase [name] HF Cardiology: Dr. Aundra [name]  76 y.o. with history of HTN and OSA was found to have interstitial lung disease and was referred for workup of possible pulmonary hypertension by Dr. Chase [name].  Patient was generally doing well with minimal medical problems until 12/22.  At that time, he was hospitalized with chest pain.  Coronary CT was done during 12/22 admission showing nonobstructive CAD, but interstitial lung disease was incidentally noted on the CT.  Patient had an echo showing EF 60-65%, mild LVH, mildly decreased RV systolic function, PASP 38 mmHg, IVC dilated.  He was referred to Dr. Chase [name] for further workup of ILD, high resolution CT chest showed ILD with NSIP pattern.  Autoimmune serologies were weakly positive for ANA and P-ANCA. Patient has not started anti-fibrotic meds at this point, plan for watchful waiting.  He was referred here due to at least mild pulmonary hypertension by echo with mildly decreased RV function.   Patient has been generally doing well.  No further chest pain. He walks 2-3 miles/day with no exertional dyspnea.  He seems to be minimally symptomatic from ILD.  Main complaint is knee pain, especially when walking up stairs.  No palpitations or lightheadedness.  BP has been running high at home and is high in the office today.   ECG (personally reviewed): NSR, 1st degree AVB, PVC  Labs (12/22): K 4, creatinine 0.78, LDL 109  PMH: 1. OSA: CPAP 2. HTN 3. Hyperlipidemia 4. Ascending aortic aneurysm: 4.2 cm ascending aorta on 12/22 CT chest.  5. Interstitial lung disease: This was noted on high resolution CT chest in 1/23.  NSIP pattern.   - RF-, SCL-70-, SSA/B-, ESR 51, ANA 1:80, P-ANCA 1:40 - Echo (12/22): EF 60-65%, mild LVH, mildly decreased RV systolic function, PASP 38 mmHg, IVC dilated.  6. CAD: Coronary CTA (12/22) with calcium score 848 Agatston units (74th percentile), mild nonobstructive CAD.  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, retired from Web designer, no dietary restrictions  Other Topics Concern   Not on file  Social History Narrative   Not on file   Social Determinants of Health   Financial Resource Strain: Not on file  Food Insecurity: Not on file  Transportation Needs: Not on file  Physical Activity: Not on file  Stress: Not on file  Social Connections: Not on file  Intimate Partner Violence: Not on file   Family History  Problem Relation Age of Onset   Stroke Father        from surgery   Emphysema Father        smoker   Heart disease Father        carotid artery disease, vasculopathy   Allergic Disorder Sister    ROS: All systems reviewed and negative except as per HPI.   Current Outpatient Medications  Medication Sig Dispense Refill   aspirin EC 81  MG tablet Take 1 tablet (81 mg total) by mouth daily. Swallow whole. 90 tablet 3   losartan (COZAAR) 100 MG tablet TAKE 1 TABLET BY MOUTH EVERY DAY 90 tablet 1   Multiple Vitamin (MULTIVITAMIN) tablet Take 1 tablet by mouth daily.     Multiple Vitamins-Minerals (PRESERVISION AREDS 2 PO) Take 1 capsule by mouth 2 (two) times daily.     polyvinyl alcohol (LIQUIFILM TEARS) 1.4 % ophthalmic solution Place 1 drop into both eyes as needed for dry eyes.     rosuvastatin (CRESTOR) 20 MG tablet Take 1 tablet (20 mg total) by mouth daily. 90 tablet 3   hydrochlorothiazide (HYDRODIURIL) 50 MG tablet Take 1 tablet (50 mg total) by mouth  daily. 30 tablet 6   No current facility-administered medications for this encounter.   BP (!) 160/80   Pulse 81   Wt 107 kg (235 lb 12.8 oz)   SpO2 96%   BMI 36.39 kg/m  General: NAD Neck: No JVD, no thyromegaly or thyroid nodule.  Lungs: Clear to auscultation bilaterally with normal respiratory effort. CV: Nondisplaced PMI.  Heart regular S1/S2, no S3/S4, no murmur.  No peripheral edema.  No carotid bruit.  Normal pedal pulses.  Abdomen: Soft, nontender, no hepatosplenomegaly, no distention.  Skin: Intact without lesions or rashes.  Neurologic: Alert and oriented x 3.  Psych: Normal affect. Extremities: No clubbing or cyanosis.  HEENT: Normal.   Assessment/Plan: 1. Interstitial lung disease: Noted by coronary CTA and confirmed on HRCT in 1/23.  NSIP pattern, weakly positive ANA and P-ANCA.  Has seen Dr. Chase [name], plan for now is watchful waiting (have not started anti-fibrotic meds).   2. Coronary artery disease: Hospitalized with noncardiac chest pain in 12/22.  Coronary CT with calcium score 848 Agatston units and mild nonobstructive CAD.  - He is on ASA 81 daily.  - Rosuvastatin increased to 20 mg daily several months ago.  Check lipids/LFTs today.  3. OSA: Continue CPAP.  4. HTN: BP has been running high.  - Continue losartan 100 mg daily.  - Increase HCTZ to 50 mg daily, BMET today and in 10 days.  - If BP remains high with this change, add amlodipine.  5. ?Pulmonary hypertension: Echo in 12/22 showed mild pulmonary hypertension (PASP 38 mmHg) with mild RV dilation. Patient does have interstitial lung disease.  He may have mild pulmonary hypertension due to OSA.  However, he could also have pulmonary hypertension related to interstitial lung disease (PH-ILD).   - I recommended RHC to sort out the above.  We discussed risks/benefits and patient agrees to procedure.  - If he has only mild PH, would rule out chronic PE by V/Q scan but likely avoid selective treatments as OSA  could be the cause.  - If he has more significant PH by RHC, would rule out chronic PE by V/Q scan and then consider treatment with Tyvaso DPE for PH-ILD.    Christian [name] 11/24/2021

## 2021-11-24 NOTE — Addendum Note (Signed)
Encounter addended by: Larey Dresser, MD on: 11/24/2021 10:03 PM  Actions taken: Clinical Note Signed

## 2021-11-26 ENCOUNTER — Other Ambulatory Visit (HOSPITAL_COMMUNITY): Payer: Self-pay | Admitting: *Deleted

## 2021-11-26 DIAGNOSIS — I272 Pulmonary hypertension, unspecified: Secondary | ICD-10-CM

## 2021-11-29 ENCOUNTER — Telehealth (HOSPITAL_COMMUNITY): Payer: Self-pay | Admitting: *Deleted

## 2021-11-29 NOTE — Telephone Encounter (Signed)
[place] auth request faxed to [employer]

## 2021-12-04 ENCOUNTER — Other Ambulatory Visit: Payer: Self-pay

## 2021-12-04 ENCOUNTER — Ambulatory Visit (HOSPITAL_COMMUNITY)
Admission: RE | Disposition: A | Discharge status: Home / Self Care | Payer: Self-pay | Source: Home / Self Care | Attending: Cardiology

## 2021-12-04 ENCOUNTER — Ambulatory Visit (HOSPITAL_COMMUNITY)
Admission: RE | Admit: 2021-12-04 | Discharge: 2021-12-04 | Disposition: A | Discharge status: Home / Self Care | Payer: Medicare HMO | Attending: Cardiology | Admitting: Cardiology

## 2021-12-04 ENCOUNTER — Encounter (HOSPITAL_COMMUNITY): Payer: Self-pay | Admitting: Cardiology

## 2021-12-04 DIAGNOSIS — I2510 Atherosclerotic heart disease of native coronary artery without angina pectoris: Secondary | ICD-10-CM | POA: Diagnosis not present

## 2021-12-04 DIAGNOSIS — G4733 Obstructive sleep apnea (adult) (pediatric): Secondary | ICD-10-CM | POA: Insufficient documentation

## 2021-12-04 DIAGNOSIS — I10 Essential (primary) hypertension: Secondary | ICD-10-CM | POA: Diagnosis not present

## 2021-12-04 DIAGNOSIS — Z7982 Long term (current) use of aspirin: Secondary | ICD-10-CM | POA: Insufficient documentation

## 2021-12-04 DIAGNOSIS — Z79899 Other long term (current) drug therapy: Secondary | ICD-10-CM | POA: Diagnosis not present

## 2021-12-04 DIAGNOSIS — J849 Interstitial pulmonary disease, unspecified: Secondary | ICD-10-CM | POA: Diagnosis not present

## 2021-12-04 DIAGNOSIS — I272 Pulmonary hypertension, unspecified: Secondary | ICD-10-CM | POA: Diagnosis present

## 2021-12-04 HISTORY — PX: RIGHT HEART CATH: CATH118263

## 2021-12-04 LAB — POCT I-STAT EG7
Acid-Base Excess: 3 mmol/L — ABNORMAL HIGH (ref 0.0–2.0)
Acid-Base Excess: 4 mmol/L — ABNORMAL HIGH (ref 0.0–2.0)
Bicarbonate: 27.4 mmol/L (ref 20.0–28.0)
Bicarbonate: 28.4 mmol/L — ABNORMAL HIGH (ref 20.0–28.0)
Calcium, Ion: 1.22 mmol/L (ref 1.15–1.40)
Calcium, Ion: 1.23 mmol/L (ref 1.15–1.40)
HCT: 44 % (ref 39.0–52.0)
HCT: 44 % (ref 39.0–52.0)
Hemoglobin: 15 g/dL (ref 13.0–17.0)
Hemoglobin: 15 g/dL (ref 13.0–17.0)
O2 Saturation: 76 %
O2 Saturation: 76 %
Potassium: 3.5 mmol/L (ref 3.5–5.1)
Potassium: 3.5 mmol/L (ref 3.5–5.1)
Sodium: 141 mmol/L (ref 135–145)
Sodium: 141 mmol/L (ref 135–145)
TCO2: 29 mmol/L (ref 22–32)
TCO2: 30 mmol/L (ref 22–32)
pCO2, Ven: 38.9 mmHg — ABNORMAL LOW (ref 44–60)
pCO2, Ven: 40 mmHg — ABNORMAL LOW (ref 44–60)
pH, Ven: 7.456 — ABNORMAL HIGH (ref 7.25–7.43)
pH, Ven: 7.459 — ABNORMAL HIGH (ref 7.25–7.43)
pO2, Ven: 38 mmHg (ref 32–45)
pO2, Ven: 38 mmHg (ref 32–45)

## 2021-12-04 LAB — BASIC METABOLIC PANEL WITH GFR
Anion gap: 12 (ref 5–15)
BUN: 12 mg/dL (ref 8–23)
CO2: 25 mmol/L (ref 22–32)
Calcium: 9.3 mg/dL (ref 8.9–10.3)
Chloride: 102 mmol/L (ref 98–111)
Creatinine, Ser: 0.75 mg/dL (ref 0.61–1.24)
GFR, Estimated: >60 mL/min
Glucose, Bld: 121 mg/dL — ABNORMAL HIGH (ref 70–99)
Potassium: 3.4 mmol/L — ABNORMAL LOW (ref 3.5–5.1)
Sodium: 139 mmol/L (ref 135–145)

## 2021-12-04 SURGERY — RIGHT HEART CATH
Anesthesia: LOCAL

## 2021-12-04 MED ORDER — HEPARIN (PORCINE) IN NACL 1000-0.9 UT/500ML-% IV SOLN
INTRAVENOUS | Status: AC
  Filled 2021-12-04: qty 1000

## 2021-12-04 MED ORDER — ONDANSETRON HCL 4 MG/2ML IJ SOLN: 4.0000 mg | Freq: Four times a day (QID) | INTRAMUSCULAR | Status: DC | PRN

## 2021-12-04 MED ORDER — LIDOCAINE HCL (PF) 1 % IJ SOLN
INTRAMUSCULAR | Status: AC
  Filled 2021-12-04: qty 30

## 2021-12-04 MED ORDER — LABETALOL HCL 5 MG/ML IV SOLN: 10.0000 mg | INTRAVENOUS | Status: DC | PRN

## 2021-12-04 MED ORDER — HYDRALAZINE HCL 20 MG/ML IJ SOLN: 10.0000 mg | INTRAMUSCULAR | Status: DC | PRN

## 2021-12-04 MED ORDER — SODIUM CHLORIDE 0.9% FLUSH
3.0000 mL | Freq: Two times a day (BID) | INTRAVENOUS | Status: DC
Start: 2021-12-04 — End: 2021-12-04

## 2021-12-04 MED ORDER — SODIUM CHLORIDE 0.9 % IV SOLN: 250.0000 mL | INTRAVENOUS | Status: DC | PRN

## 2021-12-04 MED ORDER — SODIUM CHLORIDE 0.9% FLUSH: 3.0000 mL | INTRAVENOUS | Status: DC | PRN

## 2021-12-04 MED ORDER — VERAPAMIL HCL 2.5 MG/ML IV SOLN
INTRAVENOUS | Status: AC
  Filled 2021-12-04: qty 2

## 2021-12-04 MED ORDER — HEPARIN SODIUM (PORCINE) 1000 UNIT/ML IJ SOLN
INTRAMUSCULAR | Status: AC
  Filled 2021-12-04: qty 10

## 2021-12-04 MED ORDER — SODIUM CHLORIDE 0.9% FLUSH: 3.0000 mL | Freq: Two times a day (BID) | INTRAVENOUS | Status: DC

## 2021-12-04 MED ORDER — SODIUM CHLORIDE 0.9 % IV SOLN: INTRAVENOUS | Status: DC

## 2021-12-04 MED ORDER — ACETAMINOPHEN 325 MG PO TABS: 650.0000 mg | ORAL_TABLET | ORAL | Status: DC | PRN

## 2021-12-04 MED ORDER — LIDOCAINE HCL (PF) 1 % IJ SOLN
INTRAMUSCULAR | Status: DC | PRN
  Administered 2021-12-04: 2 mL

## 2021-12-04 SURGICAL SUPPLY — 9 items
CATH BALLN WEDGE 5F 110CM (CATHETERS) ×1 IMPLANT
KIT HEART LEFT (KITS) ×3 IMPLANT
PACK CARDIAC CATHETERIZATION (CUSTOM PROCEDURE TRAY) ×3 IMPLANT
PROTECTION STATION PRESSURIZED (MISCELLANEOUS) ×2
SHEATH GLIDE SLENDER 4/5FR (SHEATH) ×1 IMPLANT
STATION PROTECTION PRESSURIZED (MISCELLANEOUS) IMPLANT
TRANSDUCER W/STOPCOCK (MISCELLANEOUS) ×4 IMPLANT
TUBING ART PRESS 72  MALE/FEM (TUBING) ×2
TUBING ART PRESS 72 MALE/FEM (TUBING) IMPLANT

## 2021-12-04 NOTE — Interval H&P Note (Signed)
History and Physical Interval Note:  12/04/2021 10:11 AM  Christian [name]  has presented today for surgery, with the diagnosis of pulmonary hypertension.  The various methods of treatment have been discussed with the patient and family. After consideration of risks, benefits and other options for treatment, the patient has consented to  Procedure(s): RIGHT HEART CATH (N/A) as a surgical intervention.  The patient's history has been reviewed, patient examined, no change in status, stable for surgery.  I have reviewed the patient's chart and labs.  Questions were answered to the patient's satisfaction.     Marciano Mundt [employer]

## 2021-12-04 NOTE — Discharge Instructions (Signed)

## 2021-12-04 NOTE — Progress Notes (Signed)
Patient was given discharge instructions. He verbalized understanding. 

## 2021-12-05 ENCOUNTER — Other Ambulatory Visit: Payer: Self-pay

## 2021-12-05 DIAGNOSIS — E876 Hypokalemia: Secondary | ICD-10-CM

## 2021-12-05 MED FILL — Heparin Sod (Porcine)-NaCl IV Soln 1000 Unit/500ML-0.9%: INTRAVENOUS | Qty: 1000 | Status: AC

## 2021-12-07 DIAGNOSIS — G4733 Obstructive sleep apnea (adult) (pediatric): Secondary | ICD-10-CM | POA: Diagnosis not present

## 2021-12-19 ENCOUNTER — Other Ambulatory Visit (INDEPENDENT_AMBULATORY_CARE_PROVIDER_SITE_OTHER): Payer: Medicare HMO

## 2021-12-19 DIAGNOSIS — E876 Hypokalemia: Secondary | ICD-10-CM | POA: Diagnosis not present

## 2021-12-19 LAB — COMPREHENSIVE METABOLIC PANEL
ALT: 39 U/L (ref 0–53)
AST: 29 U/L (ref 0–37)
Albumin: 4.3 g/dL (ref 3.5–5.2)
Alkaline Phosphatase: 66 U/L (ref 39–117)
BUN: 15 mg/dL (ref 6–23)
CO2: 31 mEq/L (ref 19–32)
Calcium: 9.8 mg/dL (ref 8.4–10.5)
Chloride: 100 mEq/L (ref 96–112)
Creatinine, Ser: 0.86 mg/dL (ref 0.40–1.50)
GFR: 83.86 mL/min (ref >60.00)
Glucose, Bld: 141 mg/dL — ABNORMAL HIGH (ref 70–99)
Potassium: 3.8 mEq/L (ref 3.5–5.1)
Sodium: 139 mEq/L (ref 135–145)
Total Bilirubin: 0.7 mg/dL (ref 0.2–1.2)
Total Protein: 7.5 g/dL (ref 6.0–8.3)

## 2021-12-20 ENCOUNTER — Other Ambulatory Visit: Payer: Self-pay

## 2021-12-20 ENCOUNTER — Other Ambulatory Visit (INDEPENDENT_AMBULATORY_CARE_PROVIDER_SITE_OTHER): Payer: Medicare HMO

## 2021-12-20 DIAGNOSIS — R739 Hyperglycemia, unspecified: Secondary | ICD-10-CM | POA: Diagnosis not present

## 2021-12-20 LAB — HEMOGLOBIN A1C: Hgb A1c MFr Bld: 6.1 % (ref 4.6–6.5)

## 2021-12-20 NOTE — Addendum Note (Signed)
Addended by: Manuela Schwartz on: 12/20/2021 08:58 AM   Modules accepted: Orders

## 2021-12-25 ENCOUNTER — Other Ambulatory Visit (HOSPITAL_COMMUNITY): Payer: Self-pay

## 2021-12-25 ENCOUNTER — Encounter (HOSPITAL_COMMUNITY): Payer: Self-pay | Admitting: Cardiology

## 2021-12-25 ENCOUNTER — Ambulatory Visit (HOSPITAL_COMMUNITY)
Admission: RE | Admit: 2021-12-25 | Discharge: 2021-12-25 | Disposition: A | Discharge status: Home / Self Care | Payer: Medicare HMO | Source: Ambulatory Visit | Attending: Cardiology | Admitting: Cardiology

## 2021-12-25 VITALS — BP 124/80 | HR 85 | Wt 236.4 lb

## 2021-12-25 DIAGNOSIS — Z7901 Long term (current) use of anticoagulants: Secondary | ICD-10-CM | POA: Diagnosis not present

## 2021-12-25 DIAGNOSIS — I2510 Atherosclerotic heart disease of native coronary artery without angina pectoris: Secondary | ICD-10-CM | POA: Insufficient documentation

## 2021-12-25 DIAGNOSIS — Z7982 Long term (current) use of aspirin: Secondary | ICD-10-CM | POA: Insufficient documentation

## 2021-12-25 DIAGNOSIS — I272 Pulmonary hypertension, unspecified: Secondary | ICD-10-CM | POA: Diagnosis not present

## 2021-12-25 DIAGNOSIS — I5032 Chronic diastolic (congestive) heart failure: Secondary | ICD-10-CM

## 2021-12-25 DIAGNOSIS — G4733 Obstructive sleep apnea (adult) (pediatric): Secondary | ICD-10-CM | POA: Diagnosis not present

## 2021-12-25 DIAGNOSIS — I110 Hypertensive heart disease with heart failure: Secondary | ICD-10-CM | POA: Insufficient documentation

## 2021-12-25 DIAGNOSIS — Z9989 Dependence on other enabling machines and devices: Secondary | ICD-10-CM | POA: Insufficient documentation

## 2021-12-25 DIAGNOSIS — Z79899 Other long term (current) drug therapy: Secondary | ICD-10-CM | POA: Insufficient documentation

## 2021-12-25 DIAGNOSIS — J8489 Other specified interstitial pulmonary diseases: Secondary | ICD-10-CM | POA: Insufficient documentation

## 2021-12-25 MED ORDER — DAPAGLIFLOZIN PROPANEDIOL 10 MG PO TABS: 10.0000 mg | ORAL_TABLET | Freq: Every day | ORAL | 11 refills | Status: DC

## 2021-12-25 NOTE — Patient Instructions (Signed)
Start Farxiga 31m daily.  Lab work in 10 days   Your physician recommends that you schedule a follow-up appointment in: 1 year ( August 2024)  ** please call the office in May 2024 to arrange your follow up appointment **  If you have any questions or concerns before your next appointment please send [place]aa message through [place] call our office at [phone]    TO LEAVE A MESSAGE FOR THE NURSE SELECT OPTION 2, PLEASE LEAVE A MESSAGE INCLUDING: YOUR NAME DATE OF BIRTH CALL BACK NUMBER REASON FOR CALL**this is important as we prioritize the call backs  YOU WILL RECEIVE A CALL BACK THE SAME DAY AS LONG AS YOU CALL BEFORE 4:00 PM  [facility] you and your health needs are our priority. As part of our continuing mission to provide you with exceptional heart care, we have created designated Provider Care Teams. These Care Teams include your primary Cardiologist (physician) and Advanced Practice Providers (APPs- Physician Assistants and Nurse Practitioners) who all work together to provide you with the care you need, when you need it.   You may see any of the following providers on your designated Care Team at your next follow up: Dr DGlori BickersDr DHaynes Kerns NP BLyda Jester [facility] PUtahLAudry Riles PharmD   Please be sure to bring in all your medications bottles to every appointment.

## 2021-12-25 NOTE — Progress Notes (Signed)
PCP: Mosie Lukes, MD Pulmonology: Dr. Chase Caller HF Cardiology: Dr. Aundra Dubin  76 y.o. with history of HTN and OSA was found to have interstitial lung disease and was referred for workup of possible pulmonary hypertension by Dr. Chase Caller.  Patient was generally doing well with minimal medical problems until 12/22.  At that time, he was hospitalized with chest pain.  Coronary CT was done during 12/22 admission showing nonobstructive CAD, but interstitial lung disease was incidentally noted on the CT.  Patient had an echo showing EF 60-65%, mild LVH, mildly decreased RV systolic function, PASP 38 mmHg, IVC dilated.  He was referred to Dr. 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  Autoimmune serologies were weakly positive for ANA and P-ANCA. Patient has not started anti-fibrotic meds at this point, plan for watchful waiting.  He was referred here due to at least mild pulmonary hypertension by echo with mildly decreased RV function.   RHC was done in 7/23 showing mildly elevated PCWP, normal RA pressure, very mild pulmonary venous hypertension likely from diastolic CHF.   Weight is stable and BP is controlled.  Patient denies exertional dyspnea or chest pain.  No orthopnea/PND.  He is trying to get more exercise and wants to lose weight.   Labs (12/22): K 4, creatinine 0.78, LDL 109 Labs (7/23): LDL 70 Labs (8/23): K 3.8, creatinine 0.86  PMH: 1. OSA: CPAP 2. HTN 3. Hyperlipidemia 4. Ascending aortic aneurysm: 4.2 cm ascending aorta on 12/22 CT chest.  5. Interstitial lung disease: This was noted on high resolution CT chest in 1/23.  NSIP pattern.   - RF-, SCL-70-, SSA/B-, ESR 51, ANA 1:80, P-ANCA 1:40 - Echo (12/22): EF 60-65%, mild LVH, mildly decreased RV systolic function, PASP 38 mmHg, IVC dilated.  6. CAD: Coronary CTA (12/22) with calcium score 848 Agatston units (74th percentile), mild nonobstructive CAD. 7. Chronic diastolic CHF: RHC  (3/53) with mean RA 6, PA 36/16 mean 25, mean PCWP 17, CI 3.68, PVR 1 WU  Social History   Socioeconomic History   Marital status: Married    Spouse name: Not on file   Number of children: 1   Years of education: Not on file   Highest education level: Not on file  Occupational History   Occupation: Retired  Tobacco Use   Smoking status: Never   Smokeless tobacco: Never  Substance and Sexual Activity   Alcohol use: Yes    Alcohol/week: 0.0 standard drinks of alcohol    Comment: 2-3 glasses of wine every night   Drug use: No   Sexual activity: Yes    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, retired from Web [employer], no dietary restrictions  Other Topics Concern   Not on file  Social History Narrative   Not on file   Social Determinants of Health   Financial Resource Strain: Not on file  Food Insecurity: Not on file  Transportation Needs: Not on file  Physical Activity: Not on file  Stress: Not on file  Social Connections: Not on file  Intimate Partner Violence: Not on file   Family History  Problem Relation Age of Onset   Stroke Father        from surgery   Emphysema Father        smoker   Heart disease Father        carotid artery disease, vasculopathy   Allergic Disorder Sister    ROS: All systems reviewed and negative except  as per HPI.   Current Outpatient Medications  Medication Sig Dispense Refill   aspirin EC 81 MG tablet Take 1 tablet (81 mg total) by mouth daily. Swallow whole. 90 tablet 3   dapagliflozin propanediol (FARXIGA) 10 MG TABS tablet Take 1 tablet (10 mg total) by mouth daily before breakfast. 30 tablet 11   hydrochlorothiazide (HYDRODIURIL) 50 MG tablet Take 1 tablet (50 mg total) by mouth daily. 30 tablet 6   losartan (COZAAR) 100 MG tablet TAKE 1 TABLET BY MOUTH EVERY DAY 90 tablet 1   Multiple Vitamin (MULTIVITAMIN) tablet Take 1 tablet by mouth daily.     Multiple Vitamins-Minerals (PRESERVISION AREDS 2 PO) Take 1 capsule by mouth 2 (two)  times daily.     rosuvastatin (CRESTOR) 20 MG tablet Take 1 tablet (20 mg total) by mouth daily. 90 tablet 3   No current facility-administered medications for this encounter.   BP 124/80   Pulse 85   Wt 107.2 kg (236 lb 6.4 oz)   SpO2 96%   BMI 34.91 kg/m  General: NAD Neck: Thick. No JVD, no thyromegaly or thyroid nodule.  Lungs: Clear to auscultation bilaterally with normal respiratory effort. CV: Nondisplaced PMI.  Heart regular S1/S2, no S3/S4, no murmur.  No peripheral edema.  No carotid bruit.  Normal pedal pulses.  Abdomen: Soft, nontender, no hepatosplenomegaly, no distention.  Skin: Intact without lesions or rashes.  Neurologic: Alert and oriented x 3.  Psych: Normal affect. Extremities: No clubbing or cyanosis.  HEENT: Normal.   Assessment/Plan: 1. Interstitial lung disease: Noted by coronary CTA and confirmed on HRCT in 1/23.  NSIP pattern, weakly positive ANA and P-ANCA.  Has seen Dr. Chase Caller, plan for now is watchful waiting (have not started anti-fibrotic meds).   2. Coronary artery disease: Hospitalized with noncardiac chest pain in 12/22.  Coronary CT with calcium score 848 Agatston units and mild nonobstructive CAD.  - He is on ASA 81 daily.  - Continue Crestor 20 mg daily, LDL acceptable when last checked.  3. OSA: Continue CPAP.  4. HTN: BP controlled.  - Continue losartan 100 mg daily.  - Continue HCTZ 50 mg daily.  - Work on weight loss.  5. Chronic diastolic CHF: Echo in 70/14 showed mild pulmonary hypertension (PASP 38 mmHg) with mild RV dilation. Patient had [facility] in 7/23 which showed mildly elevated PCWP and PA pressure likely due to diastolic CHF (mild).    - Start Farxiga 10 mg daily with BMET in 10 days.   He can followup with me in 1 year unless symptoms change.     [name] 12/25/2021

## 2021-12-27 ENCOUNTER — Encounter: Payer: Medicare HMO | Admitting: Family Medicine

## 2022-01-04 ENCOUNTER — Other Ambulatory Visit: Payer: Self-pay | Admitting: Cardiology

## 2022-01-04 ENCOUNTER — Other Ambulatory Visit: Payer: Self-pay

## 2022-01-04 DIAGNOSIS — I5022 Chronic systolic (congestive) heart failure: Secondary | ICD-10-CM | POA: Diagnosis not present

## 2022-01-04 DIAGNOSIS — J849 Interstitial pulmonary disease, unspecified: Secondary | ICD-10-CM

## 2022-01-05 LAB — BASIC METABOLIC PANEL
BUN/Creatinine Ratio: 14 (ref 10–24)
BUN: 13 mg/dL (ref 8–27)
CO2: 22 mmol/L (ref 20–29)
Calcium: 10.3 mg/dL — ABNORMAL HIGH (ref 8.6–10.2)
Chloride: 100 mmol/L (ref 96–106)
Creatinine, Ser: 0.91 mg/dL (ref 0.76–1.27)
Glucose: 132 mg/dL — ABNORMAL HIGH (ref 70–99)
Potassium: 3.6 mmol/L (ref 3.5–5.2)
Sodium: 140 mmol/L (ref 134–144)
eGFR: 87 mL/min/{1.73_m2} (ref >59)

## 2022-01-07 ENCOUNTER — Encounter: Payer: Self-pay | Admitting: Internal Medicine

## 2022-01-07 ENCOUNTER — Ambulatory Visit (INDEPENDENT_AMBULATORY_CARE_PROVIDER_SITE_OTHER): Payer: Medicare HMO | Admitting: Internal Medicine

## 2022-01-07 ENCOUNTER — Ambulatory Visit: Payer: Medicare HMO | Admitting: Internal Medicine

## 2022-01-07 VITALS — BP 122/68 | HR 96 | Temp 98.4°F | Ht 67.0 in | Wt 233.2 lb

## 2022-01-07 DIAGNOSIS — Z8669 Personal history of other diseases of the nervous system and sense organs: Secondary | ICD-10-CM | POA: Diagnosis not present

## 2022-01-07 DIAGNOSIS — J849 Interstitial pulmonary disease, unspecified: Secondary | ICD-10-CM

## 2022-01-07 DIAGNOSIS — Z6835 Body mass index (BMI) 35.0-35.9, adult: Secondary | ICD-10-CM

## 2022-01-07 DIAGNOSIS — E669 Obesity, unspecified: Secondary | ICD-10-CM

## 2022-01-07 LAB — PULMONARY FUNCTION TEST
DL/VA % pred: 104 %
DL/VA: 4.18 ml/min/mmHg/L
DLCO cor % pred: 84 %
DLCO cor: 19.22 ml/min/mmHg
DLCO unc % pred: 83 %
DLCO unc: 18.89 ml/min/mmHg
FEF 25-75 Pre: 1.98 L/sec
FEF2575-%Pred-Pre: 104 %
FEV1-%Pred-Pre: 86 %
FEV1-Pre: 2.29 L
FEV1FVC-%Pred-Pre: 108 %
FEV6-%Pred-Pre: 84 %
FEV6-Pre: 2.92 L
FEV6FVC-%Pred-Pre: 107 %
FVC-%Pred-Pre: 78 %
FVC-Pre: 2.92 L
Pre FEV1/FVC ratio: 79 %
Pre FEV6/FVC Ratio: 100 %

## 2022-01-07 NOTE — Patient Instructions (Addendum)
ICD-10-CM   1. ILD (interstitial lung disease) ([place])  J84.9     2. Class 2 obesity with body mass index (BMI) of 35.0 to 35.9 in adult, unspecified obesity type, unspecified whether serious comorbidity present  E66.9    Z68.35     3. History of obstructive sleep apnea  Z86.69      There is early/MILD ILD in CT Jan 2023 Curently asymptomatic  PFT is normal and stable   Plan (based on shared decision)  - recommend weight loss 20-30# through low carb diet +/- talk to PCP Mosie Lukes, MD about Ozempic/WEgovy  - no biopsy  - continued monitorng needed esp for progressipn   - do spirometry and dlco in 6-8 ,onths - avoid getting respiratyr illness and GERD  - consider flu, RSV and covid mRNA vaccine in fall  Followup  6-8 months do spirometry and dlco - return to see Cardelia Sassano 30 min visit in 6-8 months  - symptom score and walk test at followup

## 2022-01-07 NOTE — Progress Notes (Signed)
Spirometry and Dlco done today. 

## 2022-01-07 NOTE — Progress Notes (Signed)
OV 06/07/2021  Subjective:  Patient ID: Christian [name], male , DOB: December 14, 1945 , age 76 y.o. , MRN: [id number] , ADDRESS: [place] [id number] PCP Mosie Lukes, MD Patient Care Team: Mosie Lukes, MD as PCP - General (Family Medicine)  This Provider for this visit: Treatment Team:  Attending Provider: Brand Males, MD    06/07/2021 -   Chief Complaint  Patient presents with   New Patient (Initial Visit)    New patient apt. Did pft last week and turned in his ILD paperwork. Pt states no issues noted with breathing    [facility] - Dr Annamaria Boots  HPI Christian [name] A Hallas 76 y.o. -established patient of the pulmonary practice now referred for ILD work-up.[name]  He presents with his wife Christian [name].  He has obesity with a BMI of 35.  He is on CPAP for sleep apnea.  He has essential hypertension.  He was admitted for 2 days in mid December 2022 for chest pressure.  Prior to that at baseline he was able to walk 3 miles without getting any symptoms.  CT angiogram of the chest was negative for PE and pneumonia.  But the lung parenchyma was reported as atelectasis in the presence of contrast.  He had negative cardiac biomarkers.  He had a coronary CT scan of the chest the following day on 04/24/2021 that shows early ILD.  This was followed up by a high-resolution CT scan of the chest 05/24/2021 on the outpatient setting that continues to show bilateral bibasal densities.  The radiologist has called this is not consistent with UIP or otherwise alternative to UIP diagnostic pattern.  Therefore has been referred to the ILD clinic.  He himself feels asymptomatic.  He says that he does a total of 2-3 miles per day.  He is able to walk fast.  He is only limited by knee pain.  He walks the dog does outside yard work and never feels anything.  He says in retrospect the chest pressure was all heartburn and that timing of the CT scan correlated with this.  He says this is resolved.  He says he prior to  that and subsequently he does not have any acid reflux he is not on any treatment for it.  He does use his CPAP regularly.  Towards the end of the visit they did have questions about weight loss.  We did discuss low carbohydrate diet.   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  Denies any collagen vascular disease. -Snoring controlled by CPAP -He did have the chest pressure episode that resulted in the admission of the CT scans.  FAMILY HISTORY of LUNG DISEASE:   Father was a heavy smoker.  Had COPD but died of something else *  PERSONAL EXPOSURE HISTORY:  -Briefly tried marijuana 55 years ago and then gave it up.  No vaping no cocaine use.  No smoking cigarettes.  No intravenous drug use  HOME  EXPOSURE and HOBBY DETAILS :  -Lives in a 77 year old home.  [place]  It is in [place].  20 years ago he  had a Jacuzzi but since then has not.  He just general yard work.  He has had his home ducts are clean.  There is no mold or mildew.  Negative for any organic antigen exposure history.  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  Detail organic and inorganic antigen exposure history is negative.  PULMONARY TOXICITY HISTORY (27 items):  -Negative  INVESTIGATIONS: Most recent high-resolution CT chest 1 month after admission for chest pressure as below.  Personally visualized and also showed him  Pulmonary function test is normal.  CT Chest data - HRCT 05/24/21  Narrative & Impression  CLINICAL DATA:  Interstitial lung disease    EXAM: CT CHEST WITHOUT CONTRAST   TECHNIQUE: Multidetector CT imaging of the chest was performed following the standard protocol without intravenous contrast. High resolution imaging of the lungs, as well as inspiratory and expiratory imaging, was performed.   RADIATION DOSE REDUCTION: This exam was performed according to the departmental dose-optimization program which includes automated exposure control, adjustment of the mA and/or kV according to patient size and/or use of iterative reconstruction technique.   COMPARISON:  CT chest angiogram, 04/13/2021   FINDINGS: Cardiovascular: Aortic atherosclerosis. 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 Left coronary artery calcifications. Enlargement of the main pulmonary artery measuring up to 3.7 cm in caliber. No pericardial effusion.   Mediastinum/Nodes: No enlarged mediastinal, hilar, or axillary lymph nodes. Thyroid gland, trachea, and esophagus demonstrate no significant findings.   Lungs/Pleura: There is again seen extensive dependent bibasilar ground-glass opacity, with some associated septal thickening and possible subpleural bronchiolectasis. Mild, lobular air trapping on expiratory phase imaging. Unchanged 0.4 cm nodule of the peripheral left upper lobe (series 12, image 128). No pleural effusion or pneumothorax.   Upper Abdomen: No acute abnormality.  Hepatic steatosis.   Musculoskeletal: No chest wall abnormality. No suspicious osseous lesions identified.   IMPRESSION: 1. There is again seen extensive dependent bibasilar ground-glass opacity, with some associated septal thickening and possible subpleural bronchiolectasis. Findings are suggestive of an alternative diagnosis (not UIP) per consensus guidelines leading differential consideration fibrotic phase NSIP, although bland post infectious or inflammatory scarring remaining a  differential consideration: Diagnosis of Idiopathic Pulmonary Fibrosis: An Official ATS/ERS/JRS/ALAT Clinical Practice Guideline. [place], Iss 5, [phone], Jan 11 2017. 2. Mild, lobular air trapping on expiratory phase imaging, which may be a feature of fibrosis or related to small airways disease. 3. Unchanged 0.4 cm nodule of the peripheral left upper lobe. No follow-up needed if patient is low-risk. Non-contrast chest CT can be considered in 12 months if patient is high-risk. This recommendation follows the consensus statement: Guidelines for Management of Incidental Pulmonary Nodules Detected on CT Images: From the Fleischner Society 2017; Radiology 2017; 284:228-243. 4. 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 Recommend annual imaging followup by CTA or MRA. This recommendation follows 2010 ACCF/AHA/AATS/ACR/ASA/SCA/SCAI/SIR/STS/SVM Guidelines for the Diagnosis and Management of Patients with Thoracic Aortic Disease. Circulation. 2010; 121: D357-S177. Aortic aneurysm NOS (ICD10-I71.9) 5. Unchanged enlargement of the main pulmonary artery measuring up to 3.7 cm in caliber, as can be seen in pulmonary hypertension. 6. Coronary artery disease. 7. Hepatic steatosis.   Aortic Atherosclerosis (ICD10-I70.0).     Electronically Signed   By: Delanna Ahmadi M.D.   On: 05/24/2021 16:56  No results found.    PFT   OV 09/20/2021  Subjective:  Patient ID: Christian [name], male , DOB: 04-26-1946 , age 34 y.o. , MRN: [id number] , ADDRESS: [place] [id number] PCP Mosie Lukes, MD Patient Care Team: Mosie Lukes, MD as PCP - General (Family Medicine)  This Provider for this visit: Treatment Team:  Attending Provider: Brand Males, MD    09/20/2021 -   Chief Complaint  Patient presents with   Follow-up    Pt states he has been doing okay since last visit and denies any  complaints.   ILD - mild/early, On followup baed o shared decision makng  HPI [place] Prete 76 y.o. -returns for follow-up.  He has no complaints.  Continues to be essentially asymptomatic.  Continues his daily walks.  His wife Christian [name] is here with him.  His pulmonary function test seems lower than earlier in the year.  However it still within normal limits.  His obesity continues.  He does not feel any difference in the shortness of breath compared to baseline.  Overall he is feeling good.  Lab review shows his ANA and ANCA screening antibodies are trace positive.  His overall philosophy is again to continue monitoring.  We went over what ILD means.  Went over the different subtypes of ILD.  We went over that he is unclassified and is on expectant follow-up based on shared decision making.  He wants to continue the same approach.   No results found.       Latest Reference Range & Units 06/07/21 10:49 06/07/21 10:50  Sed Rate 0 - 20 mm/hr 51 (H)   Anti Nuclear Antibody (ANA) NEGATIVE  POSITIVE !   ANA Pattern 1  Nuclear, Speckled !   ANA Titer 1 titer 1:80 (H)   Angiotensin-Converting Enzyme 9 - 67 U/L 30   Cyclic Citrullin Peptide Ab UNITS <16   ds DNA Ab IU/mL 1   RA Latex Turbid. <14 IU/mL <14   Atypical P-ANCA titer <1:20 titer 1:40 (H)   SSA (Ro) (ENA) Antibody, IgG <1.0 NEG AI <1.0 NEG   SSB (La) (ENA) Antibody, IgG <1.0 NEG AI <1.0 NEG   Scleroderma (Scl-70) (ENA) Antibody, IgG <1.0 NEG AI <1.0 NEG   A.Fumigatus #1 Abs Negative   Negative  Micropolyspora faeni, IgG Negative   Negative  Thermoactinomyces vulgaris, IgG Negative   Negative  A. Pullulans Abs Negative   Negative  Thermoact. Saccharii Negative   Negative  Pigeon Serum Abs Negative   Negative  (H): Data is abnormally high !: Data is abnormal   OV 01/07/2022  Subjective:  Patient ID: Christian [name], male , DOB: [birth year] , age 61 y.o. , MRN: [id number] , ADDRESS: [place] [id number] PCP  Mosie Lukes, MD Patient Care Team: Mosie Lukes, MD as PCP - General (Family Medicine)  This Provider for this visit: Treatment Team:  Attending Provider: Brand Males, MD    01/07/2022 -   Chief Complaint  Patient presents with   Follow-up    PFT performed today.  Pt states he has been doing okay since last visit and denies any complaints.   Mild/early ILD versus ILA on supportive care follow-up  HPI Kile Kabler Stahle 76 y.o. -presents for follow-up.  He remains asymptomatic.  No emergency room visits or urgent care visits.  He did have cardiac cath with Dr. [name] according to history and it was normal.  No new health issues.  He is asymptomatic.  He is here with his wife Christian [name].  He again expresses desire just to have a monitoring approach.  He had pulmonary function test today and shows stability and is normal.  Last visit we discussed weight loss but has not lost any weight.  I did discuss about potential new drugs and suggested he talk to his primary care physician about it.  He is going to do that.  There are no other new issues.      SYMPTOM SCALE - ILD 06/07/2021 09/20/2021  01/07/2022 233#  Current weight     O2 use ra ra ra  Shortness of Breath 0 -> 5 scale with 5 being worst (score 6 If unable to do)    At rest 00 0 0  Simple tasks - showers, clothes change, eating, shaving 0 0 0  Household (dishes, doing bed, laundry) 0 1 0  Shopping 0 0 0  Walking level at own pace 0 1 0  Walking up Stairs 0 0 0  Total (30-36) Dyspnea Score 0 2 0      Non-dyspnea symptoms (0-> 5 scale) 06/07/2021 09/20/2021  01/07/2022   How bad is your cough? 0 0 0  How bad is your fatigue 000 1 00  How bad is nausea 0 0 0  How bad is vomiting?  00 0 0  How bad is diarrhea? 0 0   How bad is anxiety? 00 0 00  How bad is depression 0 0 0  Any chronic pain - if so where and how bad Knee pain 0 0   CT Chest data   PFT     Latest Ref Rng & Units 01/07/2022    1:26 PM  09/20/2021   12:51 PM 05/28/2021    3:19 PM  PFT Results  FVC-Pre L 2.92  P 2.89  3.09   FVC-Predicted Pre % 78  P 76  81   FVC-Post L   3.14   FVC-Predicted Post %   83   Pre FEV1/FVC % % 79  P 76  77   Post FEV1/FCV % %   81   FEV1-Pre L 2.29  P 2.20  2.38   FEV1-Predicted Pre % 86  P 80  87   FEV1-Post L   2.54   DLCO uncorrected ml/min/mmHg 18.89  P 19.44  21.92   DLCO UNC% % 83  P 84  94   DLCO corrected ml/min/mmHg 19.22  P 19.44  22.44   DLCO COR %Predicted % 84  P 84  97   DLVA Predicted % 104  P 100  116   TLC L   4.28   TLC % Predicted %   65   RV % Predicted %   53     P Preliminary result       has a past medical history of Back pain (06/04/2015), DVT of lower limb, acute ([place]) (01/13/2015), High blood pressure, High cholesterol, History of chicken pox (01/13/2015), Hyperlipidemia, mild (01/13/2015), Hypokalemia (01/13/2015), Obesity, and Verrucous skin lesion (02/08/2016).   reports that he has never smoked. He has never used smokeless tobacco.  Past Surgical History:  Procedure Laterality Date   EYE SURGERY Bilateral 02/2019   Dr Talbert Forest   RIGHT HEART CATH N/A 12/04/2021   Procedure: RIGHT HEART CATH;  Surgeon: Larey Dresser, MD;  Location: [facility];  Service: Cardiovascular;  Laterality: N/A;    Allergies  Allergen Reactions  Codeine Anaphylaxis    Immunization History  Administered Date(s) Administered   Fluad Quad(high Dose 65+) 02/11/2019   Influenza, High Dose Seasonal PF 02/08/2016, 02/09/2017, 02/15/2020   Influenza,inj,Quad PF,6+ Mos 02/09/2015   Influenza,inj,quad, With Preservative 03/13/2017   Influenza-Unspecified 03/13/2014, 02/11/2017, 04/05/2018, 03/02/2021   PFIZER(Purple Top)SARS-COV-2 Vaccination 06/17/2019, 07/12/2019, 02/15/2020   Pfizer Covid-19 Vaccine Bivalent Booster 63yr & up 02/07/2021   Pneumococcal Conjugate-13 03/13/2013   Pneumococcal Polysaccharide-23 04/20/2015   Pneumococcal-Unspecified 05/14/2015   Td 01/12/2007    Zoster Recombinat (Shingrix) 11/20/2020   Zoster, Live 03/13/2014    Family History  Problem Relation Age of Onset   Stroke Father        from surgery   Emphysema Father        smoker   Heart disease Father        carotid artery disease, vasculopathy   Allergic Disorder Sister      Current Outpatient Medications:    aspirin EC 81 MG tablet, Take 1 tablet (81 mg total) by mouth daily. Swallow whole., Disp: 90 tablet, Rfl: 3   dapagliflozin propanediol (FARXIGA) 10 MG TABS tablet, Take 1 tablet (10 mg total) by mouth daily before breakfast., Disp: 30 tablet, Rfl: 11   hydrochlorothiazide (HYDRODIURIL) 50 MG tablet, Take 1 tablet (50 mg total) by mouth daily., Disp: 30 tablet, Rfl: 6   losartan (COZAAR) 100 MG tablet, TAKE 1 TABLET BY MOUTH EVERY DAY, Disp: 90 tablet, Rfl: 1   Multiple Vitamin (MULTIVITAMIN) tablet, Take 1 tablet by mouth daily., Disp: , Rfl:    Multiple Vitamins-Minerals (PRESERVISION AREDS 2 PO), Take 1 capsule by mouth 2 (two) times daily., Disp: , Rfl:    rosuvastatin (CRESTOR) 20 MG tablet, Take 1 tablet (20 mg total) by mouth daily., Disp: 90 tablet, Rfl: 3      Objective:   Vitals:   01/07/22 1401  BP: 122/68  Pulse: 96  Temp: 98.4 F (36.9 C)  TempSrc: Oral  SpO2: 97%  Weight: 233 lb 3.2 oz (105.8 kg)  Height: _0  (1.702 m)    Estimated body mass index is 36.52 kg/m as calculated from the following:   Height as of this encounter: _1  (1.702 m).   Weight as of this encounter: 233 lb 3.2 oz (105.8 kg).  _2 @  Filed Weights   01/07/22 1401  Weight: 233 lb 3.2 oz (105.8 kg)     Physical Exam    General: No distress. Obesel looks well Neuro: Alert and Oriented x 3. GCS 15. Speech normal Psych: Pleasant Resp:  Barrel Chest - no.  Wheeze - no, Crackles - YES LEFT base, No overt respiratory distress CVS: Normal heart sounds. Murmurs - no Ext: Stigmata of Connective Tissue Disease - no HEENT: Normal upper airway. PEERL +. No  post nasal drip        Assessment:       ICD-10-CM   1. ILD (interstitial lung disease) (HCC)  J84.9 Pulmonary function test    2. Class 2 obesity with body mass index (BMI) of 35.0 to 35.9 in adult, unspecified obesity type, unspecified whether serious comorbidity present  E66.9    Z68.35     3. History of obstructive sleep apnea  Z86.69          Plan:     Patient Instructions     ICD-10-CM   1. ILD (interstitial lung disease) ([place]  J84.9     2. Class 2 obesity with body mass index (BMI) of 35.0 to  35.9 in adult, unspecified obesity type, unspecified whether serious comorbidity present  E66.9    Z68.35     3. History of obstructive sleep apnea  Z86.69      There is early/MILD ILD in CT Jan 2023 Curently asymptomatic  PFT is normal and stable   Plan (based on shared decision)  - recommend weight loss 20-30# through low carb diet +/- talk to PCP Mosie Lukes, MD about Ozempic/WEgovy  - no biopsy  - continued monitorng needed esp for progressipn   - do spirometry and dlco in 6-8 ,onths - avoid getting respiratyr illness and GERD  - consider flu, RSV and covid mRNA vaccine in fall  Followup  6-8 months do spirometry and dlco - return to see Geralyn Figiel 30 min visit in 6-8 months  - symptom score and walk test at followup    SIGNATURE    Dr. Brand Males, M.D., F.C.C.P,  Pulmonary and Critical Care Medicine Staff Physician, [place] Director - Interstitial Lung Disease  Program  Pulmonary [place] [facility] [place], [place], [address]  Pager: [phone], If no answer or between  15:00h - 7:00h: call [phone] Telephone: [phone]  2:25 PM 01/07/2022

## 2022-01-17 ENCOUNTER — Other Ambulatory Visit (HOSPITAL_COMMUNITY): Payer: Self-pay | Admitting: Cardiology

## 2022-03-24 ENCOUNTER — Other Ambulatory Visit: Payer: Self-pay | Admitting: Family Medicine

## 2022-04-11 DIAGNOSIS — C44622 Squamous cell carcinoma of skin of right upper limb, including shoulder: Secondary | ICD-10-CM | POA: Diagnosis not present

## 2022-04-11 DIAGNOSIS — L570 Actinic keratosis: Secondary | ICD-10-CM | POA: Diagnosis not present

## 2022-05-20 ENCOUNTER — Encounter: Payer: Medicare HMO | Admitting: Family

## 2022-05-20 ENCOUNTER — Encounter: Payer: Medicare HMO | Admitting: Family Medicine

## 2022-05-21 DIAGNOSIS — C44329 Squamous cell carcinoma of skin of other parts of face: Secondary | ICD-10-CM | POA: Diagnosis not present

## 2022-05-28 ENCOUNTER — Other Ambulatory Visit: Payer: Self-pay | Admitting: Cardiovascular Disease

## 2022-05-29 ENCOUNTER — Other Ambulatory Visit (HOSPITAL_COMMUNITY): Payer: Self-pay | Admitting: Cardiology

## 2022-06-17 DIAGNOSIS — H353131 Nonexudative age-related macular degeneration, bilateral, early dry stage: Secondary | ICD-10-CM | POA: Diagnosis not present

## 2022-06-17 DIAGNOSIS — H52223 Regular astigmatism, bilateral: Secondary | ICD-10-CM | POA: Diagnosis not present

## 2022-06-17 DIAGNOSIS — H16223 Keratoconjunctivitis sicca, not specified as Sjogren's, bilateral: Secondary | ICD-10-CM | POA: Diagnosis not present

## 2022-06-17 DIAGNOSIS — H524 Presbyopia: Secondary | ICD-10-CM | POA: Diagnosis not present

## 2022-07-04 ENCOUNTER — Encounter: Payer: Medicare HMO | Admitting: Family Medicine

## 2022-07-14 ENCOUNTER — Other Ambulatory Visit (HOSPITAL_COMMUNITY): Payer: Self-pay | Admitting: Cardiology

## 2022-07-31 DIAGNOSIS — G4733 Obstructive sleep apnea (adult) (pediatric): Secondary | ICD-10-CM | POA: Diagnosis not present

## 2022-08-01 ENCOUNTER — Encounter: Payer: Medicare HMO | Admitting: Family Medicine

## 2022-08-01 ENCOUNTER — Other Ambulatory Visit: Payer: Self-pay

## 2022-08-01 ENCOUNTER — Telehealth: Payer: Self-pay

## 2022-08-01 DIAGNOSIS — R739 Hyperglycemia, unspecified: Secondary | ICD-10-CM

## 2022-08-01 DIAGNOSIS — E785 Hyperlipidemia, unspecified: Secondary | ICD-10-CM

## 2022-08-01 DIAGNOSIS — I10 Essential (primary) hypertension: Secondary | ICD-10-CM

## 2022-08-01 DIAGNOSIS — E876 Hypokalemia: Secondary | ICD-10-CM

## 2022-08-01 NOTE — Telephone Encounter (Signed)
Called pt lab appt was  Made, Labs ordered

## 2022-08-02 ENCOUNTER — Other Ambulatory Visit (INDEPENDENT_AMBULATORY_CARE_PROVIDER_SITE_OTHER): Payer: Medicare HMO

## 2022-08-02 DIAGNOSIS — R739 Hyperglycemia, unspecified: Secondary | ICD-10-CM

## 2022-08-02 DIAGNOSIS — I10 Essential (primary) hypertension: Secondary | ICD-10-CM | POA: Diagnosis not present

## 2022-08-02 DIAGNOSIS — E785 Hyperlipidemia, unspecified: Secondary | ICD-10-CM

## 2022-08-02 DIAGNOSIS — E876 Hypokalemia: Secondary | ICD-10-CM | POA: Diagnosis not present

## 2022-08-02 LAB — CBC WITH DIFFERENTIAL/PLATELET
Basophils Absolute: 0.1 10*3/uL (ref 0.0–0.1)
Basophils Relative: 1.2 % (ref 0.0–3.0)
Eosinophils Absolute: 0.3 10*3/uL (ref 0.0–0.7)
Eosinophils Relative: 5.1 % — ABNORMAL HIGH (ref 0.0–5.0)
HCT: 45.5 % (ref 39.0–52.0)
Hemoglobin: 15 g/dL (ref 13.0–17.0)
Lymphocytes Relative: 33.5 % (ref 12.0–46.0)
Lymphs Abs: 2.2 10*3/uL (ref 0.7–4.0)
MCHC: 33 g/dL (ref 30.0–36.0)
MCV: 96.4 fl (ref 78.0–100.0)
Monocytes Absolute: 0.8 10*3/uL (ref 0.1–1.0)
Monocytes Relative: 12.7 % — ABNORMAL HIGH (ref 3.0–12.0)
Neutro Abs: 3.1 10*3/uL (ref 1.4–7.7)
Neutrophils Relative %: 47.5 % (ref 43.0–77.0)
Platelets: 217 10*3/uL (ref 150.0–400.0)
RBC: 4.72 Mil/uL (ref 4.22–5.81)
RDW: 14.3 % (ref 11.5–15.5)
WBC: 6.4 10*3/uL (ref 4.0–10.5)

## 2022-08-02 LAB — LIPID PANEL
Cholesterol: 140 mg/dL (ref 0–200)
HDL: 50.6 mg/dL (ref >39.00)
LDL Cholesterol: 72 mg/dL (ref 0–99)
NonHDL: 89.68
Total CHOL/HDL Ratio: 3
Triglycerides: 87 mg/dL (ref 0.0–149.0)
VLDL: 17.4 mg/dL (ref 0.0–40.0)

## 2022-08-02 LAB — COMPREHENSIVE METABOLIC PANEL
ALT: 32 U/L (ref 0–53)
AST: 27 U/L (ref 0–37)
Albumin: 4.3 g/dL (ref 3.5–5.2)
Alkaline Phosphatase: 79 U/L (ref 39–117)
BUN: 16 mg/dL (ref 6–23)
CO2: 31 mEq/L (ref 19–32)
Calcium: 10 mg/dL (ref 8.4–10.5)
Chloride: 101 mEq/L (ref 96–112)
Creatinine, Ser: 0.9 mg/dL (ref 0.40–1.50)
GFR: 82.35 mL/min (ref >60.00)
Glucose, Bld: 124 mg/dL — ABNORMAL HIGH (ref 70–99)
Potassium: 4.1 mEq/L (ref 3.5–5.1)
Sodium: 141 mEq/L (ref 135–145)
Total Bilirubin: 0.8 mg/dL (ref 0.2–1.2)
Total Protein: 7.7 g/dL (ref 6.0–8.3)

## 2022-08-02 LAB — TSH: TSH: 3.12 u[IU]/mL (ref 0.35–5.50)

## 2022-08-02 LAB — HEMOGLOBIN A1C: Hgb A1c MFr Bld: 6.1 % (ref 4.6–6.5)

## 2022-08-02 NOTE — Progress Notes (Incomplete)
Subjective:   By signing my name below, I, Madelin Rear, attest that this documentation has been prepared under the direction and in the presence of Mosie Lukes, MD.  08/05/2022.     Patient ID: Christian [name], male    DOB: Sep 17, 1945, 77 y.o.   MRN: [id number]  No chief complaint on file.   HPI Patient is in today for a comprehensive physical exam.    Social history: Colonoscopy: Dexa/PSA: Pap Smear: Mammogram: Immunizations: Diet: Exercise: Dental: Vision:  Denies having any fever, new muscle pain, joint pain, new moles, congestion, sinus pain, sore throat, chest pain, palpitations, cough, SOB, wheezing, n/v/d, constipation, blood in stool, dysuria, frequency, hematuria, at this time.  Past Medical History:  Diagnosis Date   Back pain 06/04/2015   DVT of lower limb, acute ([place]) 01/13/2015   High blood pressure    High cholesterol    History of chicken pox 01/13/2015   Hyperlipidemia, mild 01/13/2015   Hypokalemia 01/13/2015   Obesity    Verrucous skin lesion 02/08/2016    Past Surgical History:  Procedure Laterality Date   EYE SURGERY Bilateral 02/2019   Dr Talbert Forest   RIGHT HEART CATH N/A 12/04/2021   Procedure: RIGHT HEART CATH;  Surgeon: Larey Dresser, MD;  Location: [facility];  Service: Cardiovascular;  Laterality: N/A;    Family History  Problem Relation Age of Onset   Stroke Father        from surgery   Emphysema Father        smoker   Heart disease Father        carotid artery disease, vasculopathy   Allergic Disorder Sister     Social History   Socioeconomic History   Marital status: Married    Spouse name: Not on file   Number of children: 1   Years of education: Not on file   Highest education level: Not on file  Occupational History   Occupation: Retired  Tobacco Use   Smoking status: Never   Smokeless tobacco: Never  Substance and Sexual Activity   Alcohol use: Yes    Alcohol/week: 0.0 standard drinks of alcohol    Comment:  2-3 glasses of wine every night   Drug use: No   Sexual activity: Yes    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, retired from Web designer, no dietary restrictions  Other Topics Concern   Not on file  Social History Narrative   Not on file   Social Determinants of Health   Financial Resource Strain: Not on file  Food Insecurity: Not on file  Transportation Needs: Not on file  Physical Activity: Not on file  Stress: Not on file  Social Connections: Not on file  Intimate Partner Violence: Not on file    Outpatient Medications Prior to Visit  Medication Sig Dispense Refill   aspirin EC 81 MG tablet Take 1 tablet (81 mg total) by mouth daily. Swallow whole. 90 tablet 3   FARXIGA 10 MG TABS tablet TAKE 1 TABLET BY MOUTH DAILY BEFORE BREAKFAST. 90 tablet 3   hydrochlorothiazide (HYDRODIURIL) 50 MG tablet TAKE 1 TABLET BY MOUTH EVERY DAY 90 tablet 2   losartan (COZAAR) 100 MG tablet Take 1 tablet (100 mg total) by mouth daily. 90 tablet 1   Multiple Vitamin (MULTIVITAMIN) tablet Take 1 tablet by mouth daily.     Multiple Vitamins-Minerals (PRESERVISION AREDS 2 PO) Take 1 capsule by mouth 2 (two) times daily.     rosuvastatin (CRESTOR)  20 MG tablet Take 1 tablet (20 mg total) by mouth daily. Please schedule appointment for further refills 90 tablet 1   No facility-administered medications prior to visit.    Allergies  Allergen Reactions   Codeine Anaphylaxis    Review of Systems  Constitutional:  Negative for fever.  HENT:  Negative for congestion, sinus pain and sore throat.   Respiratory:  Negative for cough, shortness of breath and wheezing.   Cardiovascular:  Negative for chest pain and palpitations.  Gastrointestinal:  Negative for blood in stool, constipation, diarrhea, nausea and vomiting.  Genitourinary:  Negative for dysuria, frequency and hematuria.  Musculoskeletal:  Negative for joint pain and myalgias.       Objective:    Physical Exam Constitutional:       General: He is not in acute distress.    Appearance: Normal appearance. He is not ill-appearing.  HENT:     Head: Normocephalic and atraumatic.     Right Ear: Tympanic membrane, ear canal and external ear normal.     Left Ear: Tympanic membrane, ear canal and external ear normal.  Eyes:     Extraocular Movements: Extraocular movements intact.     Pupils: Pupils are equal, round, and reactive to light.  Cardiovascular:     Rate and Rhythm: Normal rate and regular rhythm.     Heart sounds: Normal heart sounds. No murmur heard.    No gallop.  Pulmonary:     Effort: Pulmonary effort is normal. No respiratory distress.     Breath sounds: Normal breath sounds. No wheezing or rales.  Abdominal:     General: Bowel sounds are normal. There is no distension.     Palpations: Abdomen is soft.     Tenderness: There is no abdominal tenderness. There is no guarding.  Musculoskeletal:        General: Normal range of motion.  Skin:    General: Skin is warm and dry.  Neurological:     General: No focal deficit present.     Mental Status: He is alert and oriented to person, place, and time.  Psychiatric:        Mood and Affect: Mood normal.        Behavior: Behavior normal.     There were no vitals taken for this visit. Wt Readings from Last 3 Encounters:  01/07/22 233 lb 3.2 oz (105.8 kg)  12/25/21 236 lb 6.4 oz (107.2 kg)  12/04/21 230 lb (104.3 kg)    Diabetic Foot Exam - Simple   No data filed    Lab Results  Component Value Date   WBC 6.1 11/23/2021   HGB 15.0 12/04/2021   HCT 44.0 12/04/2021   PLT 196 11/23/2021   GLUCOSE 132 (H) 01/04/2022   CHOL 141 11/23/2021   TRIG 119 11/23/2021   HDL 47 11/23/2021   LDLCALC 70 11/23/2021   ALT 39 12/19/2021   AST 29 12/19/2021   NA 140 01/04/2022   K 3.6 01/04/2022   CL 100 01/04/2022   CREATININE 0.91 01/04/2022   BUN 13 01/04/2022   CO2 22 01/04/2022   TSH 2.44 08/30/2020   PSA 1.57 08/30/2020   HGBA1C 6.1 12/20/2021     Lab Results  Component Value Date   TSH 2.44 08/30/2020   Lab Results  Component Value Date   WBC 6.1 11/23/2021   HGB 15.0 12/04/2021   HCT 44.0 12/04/2021   MCV 94.8 11/23/2021   PLT 196 11/23/2021   Lab Results  Component Value Date   NA 140 01/04/2022   K 3.6 01/04/2022   CO2 22 01/04/2022   GLUCOSE 132 (H) 01/04/2022   BUN 13 01/04/2022   CREATININE 0.91 01/04/2022   BILITOT 0.7 12/19/2021   ALKPHOS 66 12/19/2021   AST 29 12/19/2021   ALT 39 12/19/2021   PROT 7.5 12/19/2021   ALBUMIN 4.3 12/19/2021   CALCIUM 10.3 (H) 01/04/2022   ANIONGAP 12 12/04/2021   EGFR 87 01/04/2022   GFR 83.86 12/19/2021   Lab Results  Component Value Date   CHOL 141 11/23/2021   Lab Results  Component Value Date   HDL 47 11/23/2021   Lab Results  Component Value Date   LDLCALC 70 11/23/2021   Lab Results  Component Value Date   TRIG 119 11/23/2021   Lab Results  Component Value Date   CHOLHDL 3.0 11/23/2021   Lab Results  Component Value Date   HGBA1C 6.1 12/20/2021       Assessment & Plan:   Problem List Items Addressed This Visit   None    No orders of the defined types were placed in this encounter.   Alphonzo Grieve, personally preformed the services described in this documentation.  All medical record entries made by the scribe were at my direction and in my presence.  I have reviewed the chart and discharge instructions (if applicable) and agree that the record reflects my personal performance and is accurate and complete. 08/05/2022.  I,Mathew Stumpf,acting as a Education administrator for Penni Homans, MD.,have documented all relevant documentation on the behalf of Penni Homans, MD,as directed by  Penni Homans, MD while in the presence of Penni Homans, MD.   Madelin Rear

## 2022-08-04 NOTE — Assessment & Plan Note (Signed)
Monitor and report any concerns, no changes to meds. Encouraged heart healthy diet such as the DASH diet and exercise as tolerated.  ?

## 2022-08-04 NOTE — Assessment & Plan Note (Signed)
Encourage heart healthy diet such as MIND or DASH diet, increase exercise, avoid trans fats, simple carbohydrates and processed foods, consider a krill or fish or flaxseed oil cap daily.  °

## 2022-08-04 NOTE — Assessment & Plan Note (Addendum)
He has been walking 2-3 miles a day and eating well, avoiding most sugar. Encouraged ongoing efforts and monitor labs.

## 2022-08-04 NOTE — Assessment & Plan Note (Signed)
Patient encouraged to maintain heart healthy diet, regular exercise, adequate sleep. Consider daily probiotics. Take medications as prescribed. Labs reviewed. ACP documents UTD. Colonoscopy 2017 repeat in 10 years if appropriate

## 2022-08-04 NOTE — Assessment & Plan Note (Signed)
hgba1c acceptable, minimize simple carbs. Increase exercise as tolerated.  

## 2022-08-05 ENCOUNTER — Ambulatory Visit (INDEPENDENT_AMBULATORY_CARE_PROVIDER_SITE_OTHER): Payer: Medicare HMO | Admitting: Family Medicine

## 2022-08-05 ENCOUNTER — Other Ambulatory Visit: Payer: Self-pay

## 2022-08-05 ENCOUNTER — Encounter: Payer: Self-pay | Admitting: Family Medicine

## 2022-08-05 VITALS — BP 124/78 | HR 70 | Temp 98.8°F | Resp 16 | Ht 68.0 in | Wt 236.4 lb

## 2022-08-05 DIAGNOSIS — E785 Hyperlipidemia, unspecified: Secondary | ICD-10-CM

## 2022-08-05 DIAGNOSIS — E876 Hypokalemia: Secondary | ICD-10-CM

## 2022-08-05 DIAGNOSIS — Z0000 Encounter for general adult medical examination without abnormal findings: Secondary | ICD-10-CM | POA: Diagnosis not present

## 2022-08-05 DIAGNOSIS — Z6835 Body mass index (BMI) 35.0-35.9, adult: Secondary | ICD-10-CM | POA: Diagnosis not present

## 2022-08-05 DIAGNOSIS — I10 Essential (primary) hypertension: Secondary | ICD-10-CM | POA: Diagnosis not present

## 2022-08-05 DIAGNOSIS — R739 Hyperglycemia, unspecified: Secondary | ICD-10-CM

## 2022-08-05 DIAGNOSIS — E669 Obesity, unspecified: Secondary | ICD-10-CM

## 2022-08-05 NOTE — Patient Instructions (Addendum)
Shingles shot #2, Shingrix at pharmacy  Tetanus at pharmacy  Hydrate 60-80 ounces of fluids  4000 steps 8000 steps  6-8 hours of sleep  Preventive Care 65 Years and Older, Male Preventive care refers to lifestyle choices and visits with your health care provider that can promote health and wellness. Preventive care visits are also called wellness exams. What can I expect for my preventive care visit? Counseling During your preventive care visit, your health care provider may ask about your: Medical history, including: Past medical problems. Family medical history. History of falls. Current health, including: Emotional well-being. Home life and relationship well-being. Sexual activity. Memory and ability to understand (cognition). Lifestyle, including: Alcohol, nicotine or tobacco, and drug use. Access to firearms. Diet, exercise, and sleep habits. Work and work Statistician. Sunscreen use. Safety issues such as seatbelt and bike helmet use. Physical exam Your health care provider will check your: Height and weight. These may be used to calculate your BMI (body mass index). BMI is a measurement that tells if you are at a healthy weight. Waist circumference. This measures the distance around your waistline. This measurement also tells if you are at a healthy weight and may help predict your risk of certain diseases, such as type 2 diabetes and high blood pressure. Heart rate and blood pressure. Body temperature. Skin for abnormal spots. What immunizations do I need?  Vaccines are usually given at various ages, according to a schedule. Your health care provider will recommend vaccines for you based on your age, medical history, and lifestyle or other factors, such as travel or where you work. What tests do I need? Screening Your health care provider may recommend screening tests for certain conditions. This may include: Lipid and cholesterol levels. Diabetes screening. This  is done by checking your blood sugar (glucose) after you have not eaten for a while (fasting). Hepatitis C test. Hepatitis B test. HIV (human immunodeficiency virus) test. STI (sexually transmitted infection) testing, if you are at risk. Lung cancer screening. Colorectal cancer screening. Prostate cancer screening. Abdominal aortic aneurysm (AAA) screening. You may need this if you are a current or former smoker. Talk with your health care provider about your test results, treatment options, and if necessary, the need for more tests. Follow these instructions at home: Eating and drinking  Eat a diet that includes fresh fruits and vegetables, whole grains, lean protein, and low-fat dairy products. Limit your intake of foods with high amounts of sugar, saturated fats, and salt. Take vitamin and mineral supplements as recommended by your health care provider. Do not drink alcohol if your health care provider tells you not to drink. If you drink alcohol: Limit how much you have to 0-2 drinks a day. Know how much alcohol is in your drink. In the [place] one drink equals one 12 oz bottle of beer (355 mL), one 5 oz glass of wine (148 mL), or one 1 oz glass of hard liquor (44 mL). Lifestyle Brush your teeth every morning and night with fluoride toothpaste. Floss one time each day. Exercise for at least 30 minutes 5 or more days each week. Do not use any products that contain nicotine or tobacco. These products include cigarettes, chewing tobacco, and vaping devices, such as e-cigarettes. If you need help quitting, ask your health care provider. Do not use drugs. If you are sexually active, practice safe sex. Use a condom or other form of protection to prevent STIs. Take aspirin only as told by your health care provider.  Make sure that you understand how much to take and what form to take. Work with your health care provider to find out whether it is safe and beneficial for you to take aspirin  daily. Ask your health care provider if you need to take a cholesterol-lowering medicine (statin). Find healthy ways to manage stress, such as: Meditation, yoga, or listening to music. Journaling. Talking to a trusted person. Spending time with friends and family. Safety Always wear your seat belt while driving or riding in a vehicle. Do not drive: If you have been drinking alcohol. Do not ride with someone who has been drinking. When you are tired or distracted. While texting. If you have been using any mind-altering substances or drugs. Wear a helmet and other protective equipment during sports activities. If you have firearms in your house, make sure you follow all gun safety procedures. Minimize exposure to UV radiation to reduce your risk of skin cancer. What's next? Visit your health care provider once a year for an annual wellness visit. Ask your health care provider how often you should have your eyes and teeth checked. Stay up to date on all vaccines. This information is not intended to replace advice given to you by your health care provider. Make sure you discuss any questions you have with your health care provider. Document Revised: 10/25/2020 Document Reviewed: 10/25/2020 Elsevier Patient Education  [place].

## 2022-08-27 ENCOUNTER — Ambulatory Visit (INDEPENDENT_AMBULATORY_CARE_PROVIDER_SITE_OTHER): Payer: Medicare HMO | Admitting: Internal Medicine

## 2022-08-27 ENCOUNTER — Telehealth: Payer: Self-pay | Admitting: Internal Medicine

## 2022-08-27 ENCOUNTER — Encounter: Payer: Self-pay | Admitting: Internal Medicine

## 2022-08-27 ENCOUNTER — Ambulatory Visit: Payer: Medicare HMO | Admitting: Internal Medicine

## 2022-08-27 VITALS — BP 138/78 | HR 84 | Temp 99.5°F | Ht 68.5 in | Wt 238.0 lb

## 2022-08-27 DIAGNOSIS — R000 Tachycardia, unspecified: Secondary | ICD-10-CM

## 2022-08-27 DIAGNOSIS — J849 Interstitial pulmonary disease, unspecified: Secondary | ICD-10-CM

## 2022-08-27 LAB — PULMONARY FUNCTION TEST
DL/VA % pred: 116 %
DL/VA: 4.66 ml/min/mmHg/L
DLCO cor % pred: 91 %
DLCO cor: 20.73 ml/min/mmHg
DLCO unc % pred: 92 %
DLCO unc: 20.96 ml/min/mmHg
FEF 25-75 Pre: 1.66 L/sec
FEF2575-%Pred-Pre: 89 %
FEV1-%Pred-Pre: 81 %
FEV1-Pre: 2.14 L
FEV1FVC-%Pred-Pre: 105 %
FEV6-%Pred-Pre: 82 %
FEV6-Pre: 2.82 L
FEV6FVC-%Pred-Pre: 107 %
FVC-%Pred-Pre: 77 %
FVC-Pre: 2.82 L
Pre FEV1/FVC ratio: 76 %
Pre FEV6/FVC Ratio: 100 %

## 2022-08-27 NOTE — Patient Instructions (Addendum)
ICD-10-CM   1. 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 Tachycardia  R00.0 EKG 12-Lead       There is early/MILD ILD in CT Jan 2023 Curently asymptomatic as of 08/27/2022 PFT is normal and stable as of 08/27/2022 Heart rate was fast -> EKG showed Atrial Fibrillatin possibley -> new diagnosis Noticed back strain    Plan (based on shared decision)  ILD  - recommend weight loss 20-30# through low carb diet +/- talk to PCP Bradd Canary, MD about Ozempic/WEgovy  - no biopsy  - continued monitorng needed esp for progressipn   - do  HRCT supine and prine in 8 months  - do spirometry and dlco in 8 months - avoid getting respiratyr illness and GERD - consider flu, RSV and covid mRNA vaccine in fall as appropriate  Possible A Fib  - sending message t Dr Shirlee Latch  Followup  8 months do spirometry and dlco and CT chest - return to see Elin Fenley 30 min visit in 8 months  - symptom score and walk test at followup

## 2022-08-27 NOTE — Progress Notes (Signed)
Spirometry and DLCO Performed Today.  

## 2022-08-27 NOTE — Telephone Encounter (Signed)
Agree that this looks like atrial fibrillation. I will work on getting him in with me.  Will need to be anticoagulated.   Emer/Eileen: Please work in with me next week for evaluation.

## 2022-08-27 NOTE — Progress Notes (Signed)
OV 06/07/2021  Subjective:  Patient ID: Christian [name], male , DOB: December 14, 1945 , age 77 y.o. , MRN: [id number] , ADDRESS: [place] [address] PCP Mosie Lukes, MD Patient Care Team: Mosie Lukes, MD as PCP - General (Family Medicine)  This Provider for this visit: Treatment Team:  Attending Provider: Brand Males, MD    06/07/2021 -   Chief Complaint  Patient presents with   New Patient (Initial Visit)    New patient apt. Did pft last week and turned in his ILD paperwork. Pt states no issues noted with breathing    [facility] - Dr Annamaria Boots  HPI Christian [name] 77 y.o. -established patient of the pulmonary practice now referred for ILD work-up.[name]  He presents with his wife Christian [name].  He has obesity with a BMI of 35.  He is on CPAP for sleep apnea.  He has essential hypertension.  He was admitted for 2 days in mid December 2022 for chest pressure.  Prior to that at baseline he was able to walk 3 miles without getting any symptoms.  CT angiogram of the chest was negative for PE and pneumonia.  But the lung parenchyma was reported as atelectasis in the presence of contrast.  He had negative cardiac biomarkers.  He had a coronary CT scan of the chest the following day on 04/24/2021 that shows early ILD.  This was followed up by a high-resolution CT scan of the chest 05/24/2021 on the outpatient setting that continues to show bilateral bibasal densities.  The radiologist has called this is not consistent with UIP or otherwise alternative to UIP diagnostic pattern.  Therefore has been referred to the ILD clinic.  He himself feels asymptomatic.  He says that he does a total of 2-3 miles per day.  He is able to walk fast.  He is only limited by knee pain.  He walks the dog does outside yard work and never feels anything.  He says in retrospect the chest pressure was all heartburn and that timing of the CT scan correlated with this.  He says this is resolved.  He says he prior to  that and subsequently he does not have any acid reflux he is not on any treatment for it.  He does use his CPAP regularly.  Towards the end of the visit they did have questions about weight loss.  We did discuss low carbohydrate diet.   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  Denies any collagen vascular disease. -Snoring controlled by CPAP -He did have the chest pressure episode that resulted in the admission of the CT scans.  FAMILY HISTORY of LUNG DISEASE:   Father was a heavy smoker.  Had COPD but died of something else *  PERSONAL EXPOSURE HISTORY:  -Briefly tried marijuana 55 years ago and then gave it up.  No vaping no cocaine use.  No smoking cigarettes.  No intravenous drug use  HOME  EXPOSURE and HOBBY DETAILS :  -Lives in a 78 year old home.  [place]  It is in [place].  20 years ago he  had a Jacuzzi but since then has not.  He just general yard work.  He has had his home ducts are clean.  There is no mold or mildew.  Negative for any organic antigen exposure history.  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  Detail organic and inorganic antigen exposure history is negative.  PULMONARY TOXICITY HISTORY (27 items):  -Negative  INVESTIGATIONS: Most recent high-resolution CT chest 1 month after admission for chest pressure as below.  Personally visualized and also showed him  Pulmonary function test is normal.  CT Chest data - HRCT 05/24/21  Narrative & Impression  CLINICAL DATA:  Interstitial lung disease    EXAM: CT CHEST WITHOUT CONTRAST   TECHNIQUE: Multidetector CT imaging of the chest was performed following the standard protocol without intravenous contrast. High resolution imaging of the lungs, as well as inspiratory and expiratory imaging, was performed.   RADIATION DOSE REDUCTION: This exam was performed according to the departmental dose-optimization program which includes automated exposure control, adjustment of the mA and/or kV according to patient size and/or use of iterative reconstruction technique.   COMPARISON:  CT chest angiogram, 04/13/2021   FINDINGS: Cardiovascular: Aortic atherosclerosis. 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 Left coronary artery calcifications. Enlargement of the main pulmonary artery measuring up to 3.7 cm in caliber. No pericardial effusion.   Mediastinum/Nodes: No enlarged mediastinal, hilar, or axillary lymph nodes. Thyroid gland, trachea, and esophagus demonstrate no significant findings.   Lungs/Pleura: There is again seen extensive dependent bibasilar ground-glass opacity, with some associated septal thickening and possible subpleural bronchiolectasis. Mild, lobular air trapping on expiratory phase imaging. Unchanged 0.4 cm nodule of the peripheral left upper lobe (series 12, image 128). No pleural effusion or pneumothorax.   Upper Abdomen: No acute abnormality.  Hepatic steatosis.   Musculoskeletal: No chest wall abnormality. No suspicious osseous lesions identified.   IMPRESSION: 1. There is again seen extensive dependent bibasilar ground-glass opacity, with some associated septal thickening and possible subpleural bronchiolectasis. Findings are suggestive of an alternative diagnosis (not UIP) per consensus guidelines leading differential consideration fibrotic phase NSIP, although bland post infectious or inflammatory scarring remaining a  differential consideration: Diagnosis of Idiopathic Pulmonary Fibrosis: An Official ATS/ERS/JRS/ALAT Clinical Practice Guideline. [place], Iss 5, [phone], Jan 11 2017. 2. Mild, lobular air trapping on expiratory phase imaging, which may be a feature of fibrosis or related to small airways disease. 3. Unchanged 0.4 cm nodule of the peripheral left upper lobe. No follow-up needed if patient is low-risk. Non-contrast chest CT can be considered in 12 months if patient is high-risk. This recommendation follows the consensus statement: Guidelines for Management of Incidental Pulmonary Nodules Detected on CT Images: From the Fleischner Society 2017; Radiology 2017; 284:228-243. 4. 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 Recommend annual imaging followup by CTA or MRA. This recommendation follows 2010 ACCF/AHA/AATS/ACR/ASA/SCA/SCAI/SIR/STS/SVM Guidelines for the Diagnosis and Management of Patients with Thoracic Aortic Disease. Circulation. 2010; 121: D357-S177. Aortic aneurysm NOS (ICD10-I71.9) 5. Unchanged enlargement of the main pulmonary artery measuring up to 3.7 cm in caliber, as can be seen in pulmonary hypertension. 6. Coronary artery disease. 7. Hepatic steatosis.   Aortic Atherosclerosis (ICD10-I70.0).     Electronically Signed   By: Delanna Ahmadi M.D.   On: 05/24/2021 16:56  No results found.    PFT   OV 09/20/2021  Subjective:  Patient ID: Christian [name], male , DOB: 04-26-1946 , age 34 y.o. , MRN: [id number] , ADDRESS: [place] [address] PCP Mosie Lukes, MD Patient Care Team: Mosie Lukes, MD as PCP - General (Family Medicine)  This Provider for this visit: Treatment Team:  Attending Provider: Brand Males, MD    09/20/2021 -   Chief Complaint  Patient presents with   Follow-up    Pt states he has been doing okay since last visit and denies any  complaints.   ILD - mild/early, On followup baed o shared decision makng  HPI Christian [name] 77 y.o. -returns for follow-up.  He has no complaints.  Continues to be essentially asymptomatic.  Continues his daily walks.  His wife Christian [name] is here with him.  His pulmonary function test seems lower than earlier in the year.  However it still within normal limits.  His obesity continues.  He does not feel any difference in the shortness of breath compared to baseline.  Overall he is feeling good.  Lab review shows his ANA and ANCA screening antibodies are trace positive.  His overall philosophy is again to continue monitoring.  We went over what ILD means.  Went over the different subtypes of ILD.  We went over that he is unclassified and is on expectant follow-up based on shared decision making.  He wants to continue the same approach.   No results found.       Latest Reference Range & Units 06/07/21 10:49 06/07/21 10:50  Sed Rate 0 - 20 mm/hr 51 (H)   Anti Nuclear Antibody (ANA) NEGATIVE  POSITIVE !   ANA Pattern 1  Nuclear, Speckled !   ANA Titer 1 titer 1:80 (H)   Angiotensin-Converting Enzyme 9 - 67 U/L 30   Cyclic Citrullin Peptide Ab UNITS <16   ds DNA Ab IU/mL 1   RA Latex Turbid. <14 IU/mL <14   Atypical P-ANCA titer <1:20 titer 1:40 (H)   SSA (Ro) (ENA) Antibody, IgG <1.0 NEG AI <1.0 NEG   SSB (La) (ENA) Antibody, IgG <1.0 NEG AI <1.0 NEG   Scleroderma (Scl-70) (ENA) Antibody, IgG <1.0 NEG AI <1.0 NEG   A.Fumigatus #1 Abs Negative   Negative  Micropolyspora faeni, IgG Negative   Negative  Thermoactinomyces vulgaris, IgG Negative   Negative  A. Pullulans Abs Negative   Negative  Thermoact. Saccharii Negative   Negative  Pigeon Serum Abs Negative   Negative  (H): Data is abnormally high !: Data is abnormal   OV 01/07/2022  Subjective:  Patient ID: Christian [name], male , DOB: [birth year] , age 61 y.o. , MRN: [id number] , ADDRESS: [place] [address] PCP  Mosie Lukes, MD Patient Care Team: Mosie Lukes, MD as PCP - General (Family Medicine)  This Provider for this visit: Treatment Team:  Attending Provider: Brand Males, MD    01/07/2022 -   Chief Complaint  Patient presents with   Follow-up    PFT performed today.  Pt states he has been doing okay since last visit and denies any complaints.   Mild/early ILD versus ILA on supportive care follow-up  HPI Christian [name] 77 y.o. -presents for follow-up.  He remains asymptomatic.  No emergency room visits or urgent care visits.  He did have cardiac cath with Dr. [name] according to history and it was normal.  No new health issues.  He is asymptomatic.  He is here with his wife Bonita Quin.  He again expresses desire just to have a monitoring approach.  He had pulmonary function test today and shows stability and is normal.  Last visit we discussed weight loss but has not lost any weight.  I did discuss about potential new drugs and suggested he talk to his primary care physician about it.  He is going to do that.  There are no other new issues.      OV 08/27/2022  Subjective:  Patient ID: Christian [name], male , DOB: [birth year] , age 27 y.o. , MRN: [id number] , ADDRESS: [address] [place] [address] PCP Bradd Canary, MD Patient Care Team: Bradd Canary, MD as PCP - General (Family Medicine)  This Provider for this visit: Treatment Team:  Attending Provider: Kalman Shan, MD    08/27/2022 -   Chief Complaint  Patient presents with   Follow-up    Discuss PFT.  Cough with allergies.   Mild/early interstitial lung disease versus interstitial lung abnormalities findings inconsistent with UIP.  On supportive care follow-up.  HPI Camdan Burdi Coone 77 y.o. -presents with his wife.  His wife is an independent historian.  He continues to be asymptomatic.  He had pulmonary function test this continues to be normal.  He did walking desaturation test and he did not  desaturate but he was extremely tachycardic.  He was tachycardic even at rest.  Both by finger count and on the pulse oximeter his resting heart rate was around 100/min.  When he walked the CMA noticed that he was 150/min.  He is somewhat EKG showed PVCs with normal sinus rhythm and normal heart rate.  He is getting an EKG again today with [name].  He has never had a ZIO monitor in the past.  Currently prefers to have expectant follow-up.  Only other issues that he bought a new mattress in the last few months and then he is having increased back pain and stiffness.  The chiropractor who is his neighbor and he is beginning to feel better.   For his tachycardia today twelve-lead EKG: Personally visualized the 12-lead EKG and it shows possible atrial fibrillation.  I have sent a message to Dr. [name].   SYMPTOM SCALE - ILD 06/07/2021 09/20/2021  01/07/2022 233# 08/27/2022   Current weight      O2 use ra ra ra ra  Shortness of Breath 0 -> 5 scale with 5 being worst (score 6 If unable to do)     At rest 00 0 0 0  Simple tasks - showers, clothes change, eating, shaving 0 0 0 0  Household (dishes, doing bed, laundry) 0 1 0 0  Shopping 0 0 0 0  Walking level at own pace 0 1 0 0  Walking up Stairs 0 0 0 0  Total (30-36) Dyspnea Score 0 2 0 0      Non-dyspnea symptoms (0-> 5 scale) 06/07/2021 09/20/2021  01/07/2022  08/27/2022   How bad is your cough? 0 0 0 0  How bad is your fatigue 000 1 00 1  How bad is nausea 0 0 0 0  How bad is vomiting?  00 0 0 0  How bad is diarrhea? 0 0  0  How bad is anxiety? 00 0 00 0  How bad is depression 0 0 0 0  Any chronic pain - if so where and how bad Knee  pain 0 0 0   Simple office walk 224 (66+46 x 2) feet Pod A at [facility] x  3 laps goal with forehead probe 08/27/2022    O2 used ra   Number laps completed 3   Comments about pace Nl pace   Resting Pulse Ox/HR 98% and 92/min   Final Pulse Ox/HR 96% and 153/min   Desaturated </= 88% no   Desaturated  <= 3% points no   Got Tachycardic >/= 90/min yes   Symptoms at end of test asymptmatic   Miscellaneous comments Noted to be red faced by CMA      Modified Six Minute Walk - 08/27/22 1600     Type of O2 used  Room Air   SaO2%/HR  = LAP 1 = 98%/136  LAP 2 = 96%/149  LAP 3 = 96%/153.   Number of laps completed  3    Lap Pace Brisk    Resting Heartrate 92 bpm    Final Heartrate 153 bpm    Resting Pulse Ox 98 %    Desaturated to <= 3 points No    Desaturated to < 88% No    Became tachycardic Yes    Symptoms  no SOB or dyspnea    Was the O2 correction test done? No    comments SaO2%/HR = LAP 1 = 98%/136  LAP 2 = 96%/149  LAP 3 = 96%/153.               PFT     Latest Ref Rng & Units 08/27/2022    2:30 PM 01/07/2022    1:26 PM 09/20/2021   12:51 PM 05/28/2021    3:19 PM  PFT Results  FVC-Pre L 2.82  P 2.92  2.89  3.09   FVC-Predicted Pre % 77  P 78  76  81   FVC-Post L    3.14   FVC-Predicted Post %    83   Pre FEV1/FVC % % 76  P 79  76  77   Post FEV1/FCV % %    81   FEV1-Pre L 2.14  P 2.29  2.20  2.38   FEV1-Predicted Pre % 81  P 86  80  87   FEV1-Post L    2.54   DLCO uncorrected ml/min/mmHg 20.96  P 18.89  19.44  21.92   DLCO UNC% % 92  P 83  84  94   DLCO corrected ml/min/mmHg 20.73  P 19.22  19.44  22.44   DLCO COR %Predicted % 91  P 84  84  97   DLVA Predicted % 116  P 104  100  116   TLC L    4.28   TLC % Predicted %    65   RV % Predicted %    53     P Preliminary result     Modified Six Minute Walk - 08/27/22 1600     Type of O2 used  Room Air   SaO2%/HR  = LAP 1 = 98%/136  LAP 2 = 96%/149  LAP 3 = 96%/153.   Number of laps completed  3    Lap Pace Brisk    Resting Heartrate 92 bpm    Final Heartrate 153 bpm    Resting Pulse Ox 98 %    Desaturated to <= 3 points No    Desaturated to < 88% No    Became tachycardic Yes    Symptoms  no SOB or  dyspnea    Was the O2 correction test done? No    comments SaO2%/HR = LAP 1 = 98%/136  LAP 2 = 96%/149  LAP 3  = 96%/153.                has a past medical history of Back pain (06/04/2015), DVT of lower limb, acute (01/13/2015), High blood pressure, High cholesterol, History of chicken pox (01/13/2015), Hyperlipidemia, mild (01/13/2015), Hypokalemia (01/13/2015), Obesity, and Verrucous skin lesion (02/08/2016).   reports that he has never smoked. He has never used smokeless tobacco.  Past Surgical History:  Procedure Laterality Date   EYE SURGERY Bilateral 02/2019   Dr Vonna Kotyk   RIGHT HEART CATH N/A 12/04/2021   Procedure: RIGHT HEART CATH;  Surgeon: Laurey Morale, MD;  Location[facility] INVASIVE CV LAB;  Service: Cardiovascular;  Laterality: N/A;    Allergies  Allergen Reactions   Codeine Anaphylaxis    Immunization History  Administered Date(s) Administered   Fluad Quad(high Dose 65+) 02/11/2019, 01/13/2022   Influenza, High Dose Seasonal PF 02/08/2016, 02/09/2017, 02/15/2020   Influenza,inj,Quad PF,6+ Mos 02/09/2015   Influenza,inj,quad, With Preservative 03/13/2017   Influenza-Unspecified 03/13/2014, 02/11/2017, 04/05/2018, 03/02/2021   PFIZER(Purple Top)SARS-COV-2 Vaccination 06/17/2019, 07/12/2019, 02/15/2020   Pfizer Covid-19 Vaccine Bivalent Booster 47yrs & up 02/07/2021   Pneumococcal Conjugate-13 03/13/2013   Pneumococcal Polysaccharide-23 04/20/2015   Pneumococcal-Unspecified 05/14/2015   Respiratory Syncytial Virus Vaccine,Recomb Aduvanted(Arexvy) 01/31/2022   Td 01/12/2007   Unspecified SARS-COV-2 Vaccination 01/31/2022   Zoster Recombinat (Shingrix) 11/20/2020   Zoster, Live 03/13/2014    Family History  Problem Relation Age of Onset   Stroke Father        from surgery   Emphysema Father        smoker   Heart disease Father        carotid artery disease, vasculopathy   Allergic Disorder Sister      Current Outpatient Medications:    aspirin EC 81 MG tablet, Take 1 tablet (81 mg total) by mouth daily. Swallow whole., Disp: 90 tablet, Rfl: 3   FARXIGA 10 MG TABS  tablet, TAKE 1 TABLET BY MOUTH DAILY BEFORE BREAKFAST., Disp: 90 tablet, Rfl: 3   hydrochlorothiazide (HYDRODIURIL) 50 MG tablet, TAKE 1 TABLET BY MOUTH EVERY DAY, Disp: 90 tablet, Rfl: 2   losartan (COZAAR) 100 MG tablet, Take 1 tablet (100 mg total) by mouth daily., Disp: 90 tablet, Rfl: 1   Multiple Vitamin (MULTIVITAMIN) tablet, Take 1 tablet by mouth daily., Disp: , Rfl:    Multiple Vitamins-Minerals (PRESERVISION AREDS 2 PO), Take 1 capsule by mouth 2 (two) times daily., Disp: , Rfl:    rosuvastatin (CRESTOR) 20 MG tablet, Take 1 tablet (20 mg total) by mouth daily. Please schedule appointment for further refills, Disp: 90 tablet, Rfl: 1      Objective:   Vitals:   08/27/22 1600  BP: 138/78  Pulse: 84  Temp: 99.5 F (37.5 C)  TempSrc: Oral  SpO2: 96%  Weight: 238 lb (108 kg)  Height: 5' 8.5" (1.74 m)    Estimated body mass index is 35.66 kg/m as calculated from the following:   Height as of this encounter: 5' 8.5" (1.74 m).   Weight as of this encounter: 238 lb (108 kg).  @WEIGHTCHANGE @  [employer]   08/27/22 1600  Weight: 238 lb (108 kg)     Physical Exam General: No distress. oese Neuro: Alert and Oriented x 3. GCS 15. Speech normal Psych: Pleasant Resp:  Barrel Chest -  no.  Wheeze - no, Crackles - no, No overt respiratory distress CVS: Normal heart sounds. Murmurs - no Ext: Stigmata of Connective Tissue Disease - no HEENT: Normal upper airway. PEERL +. No post nasal drip        Assessment:       ICD-10-CM   1. ILD (interstitial lung disease)  J84.9 CT Chest High Resolution    Pulmonary function test    2. Tachycardia  R00.0 EKG 12-Lead         Plan:     Patient Instructions     ICD-10-CM   1. 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 Tachycardia  R00.0 EKG 12-Lead       There is early/MILD ILD in CT Jan 2023 Curently asymptomatic as of 08/27/2022 PFT is normal and stable as of 08/27/2022 Heart rate was fast -> EKG showed Atrial  Fibrillatin possibley -> new diagnosis Noticed back strain    Plan (based on shared decision)  ILD  - recommend weight loss 20-30# through low carb diet +/- talk to PCP Bradd Canary, MD about Ozempic/WEgovy  - no biopsy  - continued monitorng needed esp for progressipn   - do  HRCT supine and prine in 8 months  - do spirometry and dlco in 8 months - avoid getting respiratyr illness and GERD - consider flu, RSV and covid mRNA vaccine in fall as appropriate  Possible A Fib  - sending message t Dr Shirlee Latch  Followup  8 months do spirometry and dlco and CT chest - return to see Merrell Borsuk 30 min visit in 8 months  - symptom score and walk test at followup    SIGNATURE    Dr. Kalman Shan, M.D., F.C.C.P,  Pulmonary and Critical Care Medicine Staff Physician, [facility] Center Director - Interstitial Lung Disease  Program  Pulmonary Fibrosis [facility] at [facility] [place], [place], [address]  Pager: [phone], If no answer or between  15:00h - 7:00h: call [phone] Telephone: [phone]  4:50 PM 08/27/2022   Moderate Complexity MDM NEW OFFICE  The table below is from the 2021 E/M guidelines, first released in 2021, with minor revisions added in 2023. Must meet the requirements for 2 out of 3 dimensions to qualify.    Number and complexity of problems addressed Amount and/or complexity of data reviewed Risk of complications and/or morbidity  One or more chronic illness with mild exacerbation, progression, or side effects of treatment  Two or more stable chronic illnesses  One undiagnosed new problem with uncertain prognosis  One acute illness with systemic symptoms   Acute complicated injury Must meet the requirements for 1 of 3 of the categories)  Category 1: Tests and documents, historian  Any combination of 3 of the following:  Assessment requiring an independent historian  Review of prior external  records  Review of results of each unique test  Ordering of each unique test    Category 2: Interpretation of tests  Independent interpretation of a test perfromed by another physician/NPP  Category 3: Discuss management/tests  Discussion of magagement or tests with an external physician/NPP Prescription drug management  Decision regarding minor surgery with identfied patient or procedure risk factors  Decision regarding elective major surgery without identified patient or procedure risk factors  Diagnosis or treatment significantly limited by social determinants of health

## 2022-08-27 NOTE — Telephone Encounter (Signed)
Thanks Camera operator.  Will close message.

## 2022-08-27 NOTE — Telephone Encounter (Signed)
Christian [name]  Re [name] -saw him in the office today for mild/early ILD follow-up.  He is asymptomatic.  However we noticed that he was tachycardic even at rest and it was somewhat irregular.  He is known to have PVCs.  When we did a simple walk on him his heart rate jumped up to 150/min.  We did a resting EKG and it shows possible atrial fibrillation.  He is not known to have atrial fibrillation but he is at risk candidate.   The EKG should be available for your review and visualization.   Passing it on for your consideration      SIGNATURE    Dr. Kalman Shan, M.D., F.C.C.P,  Pulmonary and Critical Care Medicine Staff Physician, [facility] Center Director - Interstitial Lung Disease  Program  Medical Director - [facility] Pulmonary Fibrosis [facility] Network at [facility] [place], [place], [address]   Pager: [phone], If no answer  -> Check AMION or Try [phone] Telephone (clinical office): [phone] Telephone (research): [phone]  4:49 PM 08/27/2022

## 2022-08-27 NOTE — Patient Instructions (Signed)
Spirometry and DLCO Performed Today.  

## 2022-09-03 ENCOUNTER — Telehealth (HOSPITAL_COMMUNITY): Payer: Self-pay | Admitting: Pharmacy Technician

## 2022-09-03 ENCOUNTER — Other Ambulatory Visit (HOSPITAL_COMMUNITY): Payer: Self-pay

## 2022-09-03 ENCOUNTER — Other Ambulatory Visit: Payer: Self-pay | Admitting: Family Medicine

## 2022-09-03 ENCOUNTER — Ambulatory Visit (HOSPITAL_COMMUNITY)
Admission: RE | Admit: 2022-09-03 | Discharge: 2022-09-03 | Disposition: A | Discharge status: Home / Self Care | Payer: Medicare HMO | Source: Ambulatory Visit | Attending: Cardiology | Admitting: Cardiology

## 2022-09-03 ENCOUNTER — Encounter (HOSPITAL_COMMUNITY): Payer: Self-pay | Admitting: Cardiology

## 2022-09-03 VITALS — BP 130/70 | HR 83 | Wt 228.8 lb

## 2022-09-03 DIAGNOSIS — I4891 Unspecified atrial fibrillation: Secondary | ICD-10-CM | POA: Diagnosis not present

## 2022-09-03 DIAGNOSIS — I2510 Atherosclerotic heart disease of native coronary artery without angina pectoris: Secondary | ICD-10-CM | POA: Insufficient documentation

## 2022-09-03 DIAGNOSIS — J849 Interstitial pulmonary disease, unspecified: Secondary | ICD-10-CM | POA: Diagnosis not present

## 2022-09-03 DIAGNOSIS — I110 Hypertensive heart disease with heart failure: Secondary | ICD-10-CM | POA: Diagnosis not present

## 2022-09-03 DIAGNOSIS — Z8249 Family history of ischemic heart disease and other diseases of the circulatory system: Secondary | ICD-10-CM | POA: Diagnosis not present

## 2022-09-03 DIAGNOSIS — I5032 Chronic diastolic (congestive) heart failure: Secondary | ICD-10-CM | POA: Insufficient documentation

## 2022-09-03 DIAGNOSIS — G4733 Obstructive sleep apnea (adult) (pediatric): Secondary | ICD-10-CM | POA: Insufficient documentation

## 2022-09-03 DIAGNOSIS — I480 Paroxysmal atrial fibrillation: Secondary | ICD-10-CM

## 2022-09-03 DIAGNOSIS — Z7982 Long term (current) use of aspirin: Secondary | ICD-10-CM | POA: Insufficient documentation

## 2022-09-03 DIAGNOSIS — Z79899 Other long term (current) drug therapy: Secondary | ICD-10-CM | POA: Insufficient documentation

## 2022-09-03 DIAGNOSIS — Z7901 Long term (current) use of anticoagulants: Secondary | ICD-10-CM | POA: Insufficient documentation

## 2022-09-03 DIAGNOSIS — R0789 Other chest pain: Secondary | ICD-10-CM | POA: Diagnosis not present

## 2022-09-03 DIAGNOSIS — I272 Pulmonary hypertension, unspecified: Secondary | ICD-10-CM | POA: Insufficient documentation

## 2022-09-03 LAB — BASIC METABOLIC PANEL
Anion gap: 10 (ref 5–15)
BUN: 12 mg/dL (ref 8–23)
CO2: 28 mmol/L (ref 22–32)
Calcium: 10.1 mg/dL (ref 8.9–10.3)
Chloride: 100 mmol/L (ref 98–111)
Creatinine, Ser: 0.88 mg/dL (ref 0.61–1.24)
GFR, Estimated: >60 mL/min (ref >60)
Glucose, Bld: 101 mg/dL — ABNORMAL HIGH (ref 70–99)
Potassium: 3.9 mmol/L (ref 3.5–5.1)
Sodium: 138 mmol/L (ref 135–145)

## 2022-09-03 LAB — CBC
HCT: 47.9 % (ref 39.0–52.0)
Hemoglobin: 16.1 g/dL (ref 13.0–17.0)
MCH: 31.6 pg (ref 26.0–34.0)
MCHC: 33.6 g/dL (ref 30.0–36.0)
MCV: 94.1 fL (ref 80.0–100.0)
Platelets: 235 10*3/uL (ref 150–400)
RBC: 5.09 MIL/uL (ref 4.22–5.81)
RDW: 13.1 % (ref 11.5–15.5)
WBC: 6.7 10*3/uL (ref 4.0–10.5)
nRBC: 0 % (ref 0.0–0.2)

## 2022-09-03 LAB — MAGNESIUM: Magnesium: 2.4 mg/dL (ref 1.7–2.4)

## 2022-09-03 LAB — BRAIN NATRIURETIC PEPTIDE: B Natriuretic Peptide: 107.6 pg/mL — ABNORMAL HIGH (ref 0.0–100.0)

## 2022-09-03 LAB — TSH: TSH: 2.893 u[IU]/mL (ref 0.350–4.500)

## 2022-09-03 MED ORDER — APIXABAN 5 MG PO TABS: 5.0000 mg | ORAL_TABLET | Freq: Two times a day (BID) | ORAL | 11 refills | Status: DC

## 2022-09-03 NOTE — Progress Notes (Signed)
Advanced Heart Failure Clinic Progress Note    PCP: Bradd Canary, MD Pulmonology: Dr. Marchelle Gearing HF Cardiology: Dr. Shirlee Latch  77 y.o. with history of HTN and OSA was found to have interstitial lung disease and was referred for workup of possible pulmonary hypertension by Dr. Marchelle Gearing.  Patient was generally doing well with minimal medical problems until 12/22.  At that time, he was hospitalized with chest pain.  Coronary CT was done during 12/22 admission showing nonobstructive CAD, but interstitial lung disease was incidentally noted on the CT.  Patient had an echo showing EF 60-65%, mild LVH, mildly decreased RV systolic function, PASP 38 mmHg, IVC dilated.  He was referred to Dr. 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  Autoimmune serologies were weakly positive for ANA and P-ANCA. He was referred here due to at least mild pulmonary hypertension by echo with mildly decreased RV function.   RHC was done in 7/23 showing mildly elevated PCWP, normal RA pressure, very mild pulmonary venous hypertension likely from diastolic CHF.   Patient has not started anti-fibrotic meds at this point, plan for watchful waiting.   He was recently seen by Dr. Marchelle Gearing last week on 4/16 and was noted to be tachycardic. EKG was obtained and showed possible atrial fibrillation w/ CVC, no discernable p-waves. Subsequently sent back to [facility] for further assessment.   He presents today for f/u. Today's EKG shows atrial fibrillation, HR 86 bpm. BP stable. Asymptomatic. Denies palpitations, no change in baseline breathing, no CP. Denies any recent viral illnesses. No d/n/v. Reports full compliance w/ CPAP. Reports h/o moderate ETOH use but quit last week after hearing diagnosis. Prior to quitting, was drinking ~3 glasses of wine each night.   No h/o stroke/TIA. Denies any bleeding issues. No fall history.   He has been monitor BP and HR at home over the last several  days, reports average HR staying ~90s.    Labs (12/22): K 4, creatinine 0.78, LDL 109 Labs (7/23): LDL 70 Labs (8/23): K 3.8, creatinine 0.86 Labs (3/24): LDL 72, K 4.1, creatinine 0.9  PMH: 1. OSA: CPAP 2. HTN 3. Hyperlipidemia 4. Ascending aortic aneurysm: 4.2 cm ascending aorta on 12/22 CT chest.  5. Interstitial lung disease: This was noted on high resolution CT chest in 1/23.  NSIP pattern.   - RF-, SCL-70-, SSA/B-, ESR 51, ANA 1:80, P-ANCA 1:40 - Echo (12/22): EF 60-65%, mild LVH, mildly decreased RV systolic function, PASP 38 mmHg, IVC dilated.  6. CAD: Coronary CTA (12/22) with calcium score 848 Agatston units (74th percentile), mild nonobstructive CAD. 7. Chronic diastolic CHF: RHC (7/23) with mean RA 6, PA 36/16 mean 25, mean PCWP 17, CI 3.68, PVR 1 WU 8. Atrial fibrillation  Social History   Socioeconomic History   Marital status: Married    Spouse name: Not on file   Number of children: 1   Years of education: Not on file   Highest education level: Not on file  Occupational History   Occupation: Retired  Tobacco Use   Smoking status: Never   Smokeless tobacco: Never  Substance and Sexual Activity   Alcohol use: Yes    Alcohol/week: 0.0 standard drinks of alcohol    Comment: 2-3 glasses of wine every night   Drug use: No   Sexual activity: Yes    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, retired from English as a second language teacher, no dietary restrictions  Other Topics Concern   Not on file  Social History Narrative   Not on file   Social Determinants of Health   Financial Resource Strain: Not on file  Food Insecurity: Not on file  Transportation Needs: Not on file  Physical Activity: Not on file  Stress: Not on file  Social Connections: Not on file  Intimate Partner Violence: Not on file   Family History  Problem Relation Age of Onset   Stroke Father        from surgery   Emphysema Father        smoker   Heart disease Father        carotid artery disease,  vasculopathy   Allergic Disorder Sister    ROS: All systems reviewed and negative except as per HPI.   Current Outpatient Medications  Medication Sig Dispense Refill   aspirin EC 81 MG tablet Take 1 tablet (81 mg total) by mouth daily. Swallow whole. 90 tablet 3   FARXIGA 10 MG TABS tablet TAKE 1 TABLET BY MOUTH DAILY BEFORE BREAKFAST. 90 tablet 3   hydrochlorothiazide (HYDRODIURIL) 50 MG tablet TAKE 1 TABLET BY MOUTH EVERY DAY 90 tablet 2   losartan (COZAAR) 100 MG tablet Take 1 tablet (100 mg total) by mouth daily. 90 tablet 1   Multiple Vitamin (MULTIVITAMIN) tablet Take 1 tablet by mouth daily.     Multiple Vitamins-Minerals (PRESERVISION AREDS 2 PO) Take 1 capsule by mouth 2 (two) times daily.     rosuvastatin (CRESTOR) 20 MG tablet Take 1 tablet (20 mg total) by mouth daily. Please schedule appointment for further refills 90 tablet 1   No current facility-administered medications for this encounter.   There were no vitals taken for this visit.  PHYSICAL EXAM: General:  Well appearing. No respiratory difficulty HEENT: normal Neck: supple. no JVD. Carotids 2+ bilat; no bruits. No lymphadenopathy or thyromegaly appreciated. Cor: PMI nondisplaced. Irregularly irregular rhythm. No rubs, gallops or murmurs. Lungs: clear Abdomen: soft, nontender, nondistended. No hepatosplenomegaly. No bruits or masses. Good bowel sounds. Extremities: no cyanosis, clubbing, rash, edema Neuro: alert & oriented x 3, cranial nerves grossly intact. moves all 4 extremities w/o difficulty. Affect pleasant.   Assessment/Plan: 1. Atrial Fibrillation: new diagnosis. Duration unknown. Rates controlled in the 80s. Asymptomatic.  - start Eliquis 5 mg bid - plan DCCV in 3 wks  - check Labs (TSH, BMP, Mg, CBC)  - check 2D echo  - he has reduced ETOH intake  - continue CPAP nightly  2. Interstitial lung disease: Noted by coronary CTA and confirmed on HRCT in 1/23.  NSIP pattern, weakly positive ANA and P-ANCA.   Has seen Dr. Marchelle Gearing, plan for now is watchful waiting (have not started anti-fibrotic meds).   3. Coronary artery disease: Hospitalized with noncardiac chest pain in 12/22.  Coronary CT with calcium score 848 Agatston units and mild nonobstructive CAD.  - He is on ASA 81 daily.  - Continue Crestor 20 mg daily, LDL acceptable when last checked.  4. OSA: Continue CPAP.  5. HTN: BP controlled on current regimen.  - Continue losartan 100 mg daily.  - Continue HCTZ 50 mg daily.  - Check BMP  6. Chronic diastolic CHF: Echo in 12/22 showed mild pulmonary hypertension (PASP 38 mmHg) with mild RV dilation. Patient had RHC in 7/23 which showed mildly elevated PCWP and PA pressure likely due to diastolic CHF (mild). NYHA Class II (stable). Euvolemic on exam   - Continue Farxiga 10 mg daily   F/u in 4 wks post cardioversion  Robbie Lis, PA-C  09/03/2022  Patient seen with PA, agree with the above note.   Patient noted at his pulmonology appointment to have gone into atrial fibrillation with controlled rate. Unsure of length of time he has been in AF.  No change in symptoms, dyspnea only with heavy exertion.  No chest pain.    Since diagnosis, patient has quit drinking.  Prior, he was drinking 4 glasses of wine/night.   General: NAD Neck: Thick. No JVD, no thyromegaly or thyroid nodule.  Lungs: Clear to auscultation bilaterally with normal respiratory effort. CV: Nondisplaced PMI.  Heart irregular S1/S2, no S3/S4, no murmur.  Trace ankle edema.  No carotid bruit.  Normal pedal pulses.  Abdomen: Soft, nontender, no hepatosplenomegaly, no distention.  Skin: Intact without lesions or rashes.  Neurologic: Alert and oriented x 3.  Psych: Normal affect. Extremities: No clubbing or cyanosis.  HEENT: Normal.   New atrial fibrillation, rate is controlled.  Unsure how long he has been in AF.  He does not appear significantly symptomatic.  - Start Eliquis 5 mg bid and stop ASA.  - He does not  appear to need a rate control medication.  - I will get an echocardiogram.  - Check CBC, BNP, BMET, TSH - After 4 wks of anticoagulation without missing doses, I will arrange for DCCV.  Discussed risks/benefits with patient and he agreed to procedure.  - Encouraged him to back off significantly on ETOH intake (which he has done).  - He is working on weight loss.  - Continue to use CPAP.   Followup after DCCV.   [name] 09/03/2022

## 2022-09-03 NOTE — H&P (View-Only) (Signed)
Advanced Heart Failure Clinic Progress Note    PCP: Blyth, Stacey A, MD Pulmonology: Dr. Ramaswamy HF Cardiology: Dr. Ikesha Siller  77 y.o. with history of HTN and OSA was found to have interstitial lung disease and was referred for workup of possible pulmonary hypertension by Dr. Ramaswamy.  Patient was generally doing well with minimal medical problems until 12/22.  At that time, he was hospitalized with chest pain.  Coronary CT was done during 12/22 admission showing nonobstructive CAD, but interstitial lung disease was incidentally noted on the CT.  Patient had an echo showing EF 60-65%, mild LVH, mildly decreased RV systolic function, PASP 38 mmHg, IVC dilated.  He was referred to Dr. 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  Autoimmune serologies were weakly positive for ANA and P-ANCA. He was referred here due to at least mild pulmonary hypertension by echo with mildly decreased RV function.   RHC was done in 7/23 showing mildly elevated PCWP, normal RA pressure, very mild pulmonary venous hypertension likely from diastolic CHF.   Patient has not started anti-fibrotic meds at this point, plan for watchful waiting.   He was recently seen by Dr. Ramaswamy last week on 4/16 and was noted to be tachycardic. EKG was obtained and showed possible atrial fibrillation w/ CVC, no discernable p-waves. Subsequently sent back to [facility] for further assessment.   He presents today for f/u. Today's EKG shows atrial fibrillation, HR 86 bpm. BP stable. Asymptomatic. Denies palpitations, no change in baseline breathing, no CP. Denies any recent viral illnesses. No d/n/v. Reports full compliance w/ CPAP. Reports h/o moderate ETOH use but quit last week after hearing diagnosis. Prior to quitting, was drinking ~3 glasses of wine each night.   No h/o stroke/TIA. Denies any bleeding issues. No fall history.   He has been monitor BP and HR at home over the last several  days, reports average HR staying ~90s.    Labs (12/22): K 4, creatinine 0.78, LDL 109 Labs (7/23): LDL 70 Labs (8/23): K 3.8, creatinine 0.86 Labs (3/24): LDL 72, K 4.1, creatinine 0.9  PMH: 1. OSA: CPAP 2. HTN 3. Hyperlipidemia 4. Ascending aortic aneurysm: 4.2 cm ascending aorta on 12/22 CT chest.  5. Interstitial lung disease: This was noted on high resolution CT chest in 1/23.  NSIP pattern.   - RF-, SCL-70-, SSA/B-, ESR 51, ANA 1:80, P-ANCA 1:40 - Echo (12/22): EF 60-65%, mild LVH, mildly decreased RV systolic function, PASP 38 mmHg, IVC dilated.  6. CAD: Coronary CTA (12/22) with calcium score 848 Agatston units (74th percentile), mild nonobstructive CAD. 7. Chronic diastolic CHF: RHC (7/23) with mean RA 6, PA 36/16 mean 25, mean PCWP 17, CI 3.68, PVR 1 WU 8. Atrial fibrillation  Social History   Socioeconomic History   Marital status: Married    Spouse name: Not on file   Number of children: 1   Years of education: Not on file   Highest education level: Not on file  Occupational History   Occupation: Retired  Tobacco Use   Smoking status: Never   Smokeless tobacco: Never  Substance and Sexual Activity   Alcohol use: Yes    Alcohol/week: 0.0 standard drinks of alcohol    Comment: 2-3 glasses of wine every night   Drug use: No   Sexual activity: Yes    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, retired from advertising agency owner, no dietary restrictions  Other Topics Concern   Not on file    Social History Narrative   Not on file   Social Determinants of Health   Financial Resource Strain: Not on file  Food Insecurity: Not on file  Transportation Needs: Not on file  Physical Activity: Not on file  Stress: Not on file  Social Connections: Not on file  Intimate Partner Violence: Not on file   Family History  Problem Relation Age of Onset   Stroke Father        from surgery   Emphysema Father        smoker   Heart disease Father        carotid artery disease,  vasculopathy   Allergic Disorder Sister    ROS: All systems reviewed and negative except as per HPI.   Current Outpatient Medications  Medication Sig Dispense Refill   aspirin EC 81 MG tablet Take 1 tablet (81 mg total) by mouth daily. Swallow whole. 90 tablet 3   FARXIGA 10 MG TABS tablet TAKE 1 TABLET BY MOUTH DAILY BEFORE BREAKFAST. 90 tablet 3   hydrochlorothiazide (HYDRODIURIL) 50 MG tablet TAKE 1 TABLET BY MOUTH EVERY DAY 90 tablet 2   losartan (COZAAR) 100 MG tablet Take 1 tablet (100 mg total) by mouth daily. 90 tablet 1   Multiple Vitamin (MULTIVITAMIN) tablet Take 1 tablet by mouth daily.     Multiple Vitamins-Minerals (PRESERVISION AREDS 2 PO) Take 1 capsule by mouth 2 (two) times daily.     rosuvastatin (CRESTOR) 20 MG tablet Take 1 tablet (20 mg total) by mouth daily. Please schedule appointment for further refills 90 tablet 1   No current facility-administered medications for this encounter.   There were no vitals taken for this visit.  PHYSICAL EXAM: General:  Well appearing. No respiratory difficulty HEENT: normal Neck: supple. no JVD. Carotids 2+ bilat; no bruits. No lymphadenopathy or thyromegaly appreciated. Cor: PMI nondisplaced. Irregularly irregular rhythm. No rubs, gallops or murmurs. Lungs: clear Abdomen: soft, nontender, nondistended. No hepatosplenomegaly. No bruits or masses. Good bowel sounds. Extremities: no cyanosis, clubbing, rash, edema Neuro: alert & oriented x 3, cranial nerves grossly intact. moves all 4 extremities w/o difficulty. Affect pleasant.   Assessment/Plan: 1. Atrial Fibrillation: new diagnosis. Duration unknown. Rates controlled in the 80s. Asymptomatic.  - start Eliquis 5 mg bid - plan DCCV in 3 wks  - check Labs (TSH, BMP, Mg, CBC)  - check 2D echo  - he has reduced ETOH intake  - continue CPAP nightly  2. Interstitial lung disease: Noted by coronary CTA and confirmed on HRCT in 1/23.  NSIP pattern, weakly positive ANA and P-ANCA.   Has seen Dr. Ramaswamy, plan for now is watchful waiting (have not started anti-fibrotic meds).   3. Coronary artery disease: Hospitalized with noncardiac chest pain in 12/22.  Coronary CT with calcium score 848 Agatston units and mild nonobstructive CAD.  - He is on ASA 81 daily.  - Continue Crestor 20 mg daily, LDL acceptable when last checked.  4. OSA: Continue CPAP.  5. HTN: BP controlled on current regimen.  - Continue losartan 100 mg daily.  - Continue HCTZ 50 mg daily.  - Check BMP  6. Chronic diastolic CHF: Echo in 12/22 showed mild pulmonary hypertension (PASP 38 mmHg) with mild RV dilation. Patient had RHC in 7/23 which showed mildly elevated PCWP and PA pressure likely due to diastolic CHF (mild). NYHA Class II (stable). Euvolemic on exam   - Continue Farxiga 10 mg daily   F/u in 4 wks post cardioversion     Brittainy Simmons, PA-C  09/03/2022  Patient seen with PA, agree with the above note.   Patient noted at his pulmonology appointment to have gone into atrial fibrillation with controlled rate. Unsure of length of time he has been in AF.  No change in symptoms, dyspnea only with heavy exertion.  No chest pain.    Since diagnosis, patient has quit drinking.  Prior, he was drinking 4 glasses of wine/night.   General: NAD Neck: Thick. No JVD, no thyromegaly or thyroid nodule.  Lungs: Clear to auscultation bilaterally with normal respiratory effort. CV: Nondisplaced PMI.  Heart irregular S1/S2, no S3/S4, no murmur.  Trace ankle edema.  No carotid bruit.  Normal pedal pulses.  Abdomen: Soft, nontender, no hepatosplenomegaly, no distention.  Skin: Intact without lesions or rashes.  Neurologic: Alert and oriented x 3.  Psych: Normal affect. Extremities: No clubbing or cyanosis.  HEENT: Normal.   New atrial fibrillation, rate is controlled.  Unsure how long he has been in AF.  He does not appear significantly symptomatic.  - Start Eliquis 5 mg bid and stop ASA.  - He does not  appear to need a rate control medication.  - I will get an echocardiogram.  - Check CBC, BNP, BMET, TSH - After 4 wks of anticoagulation without missing doses, I will arrange for DCCV.  Discussed risks/benefits with patient and he agreed to procedure.  - Encouraged him to back off significantly on ETOH intake (which he has done).  - He is working on weight loss.  - Continue to use CPAP.   Followup after DCCV.   Priscila Bean 09/03/2022  

## 2022-09-03 NOTE — Telephone Encounter (Signed)
Advanced Heart Failure Patient Advocate Encounter  Spoke with patient in clinic regarding Eliquis co-pay. It is $47 for 30 days at this time. It will be higher when the patient hits the donut hole. Unfortunately, he is over the income limit for assistance with Eliquis. To help offset that, we were sough out a grant for [place].   The patient was approved for a [employer] grant that will help cover the cost of Farxiga. Total amount awarded, $10,000. Eligibility, 08/04/22 - 08/03/23.  ID [id number]  BIN [id number]  PCN [id number]  Group [id number]  Copy provided in clinic.  Archer Asa, CPhT

## 2022-09-03 NOTE — Patient Instructions (Signed)
STOP Aspirin START Eliquis 5 mg one tab twice a day  Labs today We will only contact you if something comes back abnormal or we need to make some changes. Otherwise no news is good news!  Your physician recommends that you schedule a follow-up appointment in: 6-8 weeks with Dr Shirlee Latch and echo  Your physician has requested that you have an echocardiogram. Echocardiography is a painless test that uses sound waves to create images of your heart. It provides your doctor with information about the size and shape of your heart and how well your heart's chambers and valves are working. This procedure takes approximately one hour. There are no restrictions for this procedure. Please do NOT wear cologne, perfume, aftershave, or lotions (deodorant is allowed). Please arrive 15 minutes prior to your appointment time.  Do the following things EVERYDAY: Weigh yourself in the morning before breakfast. Write it down and keep it in a log. Take your medicines as prescribed Eat low salt foods--Limit salt (sodium) to 2000 mg per day.  Stay as active as you can everyday Limit all fluids for the day to less than 2 liters At the [facility], you and your health needs are our priority. As part of our continuing mission to provide you with exceptional heart care, we have created designated Provider Care Teams. These Care Teams include your primary Cardiologist (physician) and Advanced Practice Providers (APPs- Physician Assistants and Nurse Practitioners) who all work together to provide you with the care you need, when you need it.   You may see any of the following providers on your designated Care Team at your next follow up: Dr Arvilla Meres Dr [name] Dr. Marcos Eke, NP Robbie Lis, [place] [facility] [place], [place] Brynda Peon, NP Karle Plumber, PharmD   Please be sure to bring in all your medications bottles to every appointment.    Thank you for  choosing [facility]      You are scheduled for a Cardioversion on 09/30/2022 with Dr. Shirlee Latch.  Please arrive at the [facility] (Main Entrance A) at [facility]: [address] [place], [place] [address] at 6:30 am (1 hour prior to procedure unless lab work is needed; if lab work is needed arrive 1.5 hours ahead)  DIET: Nothing to eat or drink after midnight except a sip of water with medications (see medication instructions below)  Medication Instructions: Hold n/a  Continue your anticoagulant: eliquis You will need to continue your anticoagulant after your procedure until you  are told by your  Provider that it is safe to stop   Labs: pre procedure labs done 09/03/22  You must have a responsible person to drive you home and stay in the waiting area during your procedure. Failure to do so could result in cancellation.  Bring your insurance cards.  *Special Note: Every effort is made to have your procedure done on time. Occasionally there are emergencies that occur at the hospital that may cause delays. Please be patient if a delay does occur.

## 2022-09-05 ENCOUNTER — Ambulatory Visit (INDEPENDENT_AMBULATORY_CARE_PROVIDER_SITE_OTHER): Payer: Medicare HMO | Admitting: Family Medicine

## 2022-09-05 VITALS — BP 128/75 | HR 101 | Temp 97.7°F | Ht 68.5 in | Wt 229.0 lb

## 2022-09-05 DIAGNOSIS — R350 Frequency of micturition: Secondary | ICD-10-CM

## 2022-09-05 DIAGNOSIS — M545 Low back pain, unspecified: Secondary | ICD-10-CM

## 2022-09-05 LAB — POC URINALSYSI DIPSTICK (AUTOMATED)
Bilirubin: NEGATIVE
Blood, UA: NEGATIVE
Glucose, UA: POSITIVE — AB
Ketones, UA: NEGATIVE
Leukocytes, UA: NEGATIVE
Nitrite, UA: NEGATIVE
Protein, UA: NEGATIVE
Spec Grav, UA: <=1.005 — AB (ref 1.010–1.025)
Urobilinogen, UA: 0.2 E.U./dL
pH, UA: 6.5 (ref 5.0–8.0)

## 2022-09-05 NOTE — Patient Instructions (Addendum)
Urine sample is only showing glucose (this is expected since you are on Farxiga). We will send it off for culture to be sure there is no UTI.  Let's go ahead and treat musculoskeletal back pain.   BACK PAIN AVS  For many people, back pain returns. Since low back pain is rarely dangerous, it is often a condition that people can learn to manage on their own.  Remain active. It is stressful on the back to sit or stand in one place. Do not sit, drive, or stand in one place for more than 30 minutes at a time. Take short walks on level surfaces as soon as pain allows. Try to increase the length of time you walk each day.  Do not stay in bed. Resting more than 1 or 2 days can delay your recovery.  Do not avoid exercise or work. Your body is made to move. It is not dangerous to be active, even though your back may hurt. Your back will likely heal faster if you return to being active before your pain is gone.  Pay attention to your body when you  bend and lift. Many people have less discomfort when lifting if they bend their knees, keep the load close to their bodies, and avoid twisting. Often, the most comfortable positions are those that put less stress on your recovering back.  Find a comfortable position to sleep. Use a firm mattress and lie on your side with your knees slightly bent. If you lie on your back, put a pillow under your knees.  Only take over-the-counter or prescription medicines as directed by your caregiver. Over-the-counter medicines to reduce pain and inflammation are often the most helpful. Your caregiver may prescribe muscle relaxant drugs. These medicines help dull your pain so you can more quickly return to your normal activities and healthy exercise.  Put ice on the injured area.  Put ice in a plastic bag.  Place a towel between your skin and the bag.  Leave the ice on for 15 to 20 minutes, 3 to 4 times a day for the first 2 to 3 days. After that, ice and heat may be alternated to  reduce pain and spasms.  Ask your caregiver about trying back exercises and gentle massage. This may be of some benefit.  Avoid feeling anxious or stressed. Stress increases muscle tension and can worsen back pain. It is important to recognize when you are anxious or stressed and learn ways to manage it. Exercise is a great option.  SEEK IMMEDIATE MEDICAL CARE IF:  You have pain that radiates from your back into your legs.  You develop new bowel or bladder control problems.  You have unusual weakness or numbness in your arms or legs.  You develop nausea or vomiting.  You develop abdominal pain.  You feel faint.    BACK PAIN PLAN Recommend as needed tylenol  Plan of rest, intermittent application of cold packs (later, may switch to heat, but do not sleep on heating pad) Home exercises discussed. Handout provided.  Proper lifting mechanics with avoidance of heavy lifting discussed.  Recommended Physical Therapy  Consider XRay studies if not improving

## 2022-09-05 NOTE — Progress Notes (Signed)
Acute Office Visit  Subjective:     Patient ID: Christian [name], male    DOB: 1946-03-19, 77 y.o.   MRN: [id number]  Chief Complaint  Patient presents with   Back Pain    Patient is in today for low back pain.     Patient reports he started having generalized low back pain about 2 months ago that started after sleeping on a new mattress. He tried to ride it out for a little while and did get some improvement, but the left lower back pain has persisted, so he wanted to make sure his kidneys were okay. States he has full range of motion. Pain is worse with any activity (4/10), but improves with rest (0/10). He reports some frequent urination during the day, but states he has been trying to drink a lot of water. He does not have frequent urination during the night. No dysuria, hematuria, fevers, chills, abdominal pain. He had BMP at cardiology visit early this week with stable renal function.       All review of systems negative except what is listed in the HPI      Objective:    BP 128/75   Pulse (!) 101   Temp 97.7 F (36.5 C) (Oral)   Ht 5' 8.5" (1.74 m)   Wt 229 lb (103.9 kg)   SpO2 99%   BMI 34.31 kg/m    Physical Exam Vitals reviewed.  Constitutional:      Appearance: Normal appearance.  Cardiovascular:     Rate and Rhythm: Normal rate and regular rhythm.     Pulses: Normal pulses.     Heart sounds: Normal heart sounds.  Pulmonary:     Effort: Pulmonary effort is normal.     Breath sounds: Normal breath sounds.  Abdominal:     General: Abdomen is flat. Bowel sounds are normal. There is no distension.     Palpations: Abdomen is soft.     Tenderness: There is no abdominal tenderness. There is no right CVA tenderness, left CVA tenderness or guarding.  Musculoskeletal:        General: No swelling or tenderness.     Right lower leg: No edema.     Left lower leg: No edema.     Comments: Full ROM  Skin:    General: Skin is warm and dry.  Neurological:      Mental Status: He is alert and oriented to person, place, and time.  Psychiatric:        Mood and Affect: Mood normal.        Behavior: Behavior normal.        Thought Content: Thought content normal.        Judgment: Judgment normal.     Results for orders placed or performed in visit on 09/05/22  POCT Urinalysis Dipstick (Automated)  Result Value Ref Range   Color, UA yellow    Clarity, UA clear    Glucose, UA Positive (A) Negative   Bilirubin negative    Ketones, UA negative    Spec Grav, UA <=1.005 (A) 1.010 - 1.025   Blood, UA negative    pH, UA 6.5 5.0 - 8.0   Protein, UA Negative Negative   Urobilinogen, UA 0.2 0.2 or 1.0 E.U./dL   Nitrite, UA negative    Leukocytes, UA Negative Negative        Assessment & Plan:   Problem List Items Addressed This Visit     Low back pain -  Primary   Relevant Orders   POCT Urinalysis Dipstick (Automated) (Completed)   Urine Culture   Other Visit Diagnoses     Urinary frequency       Relevant Orders   POCT Urinalysis Dipstick (Automated) (Completed)   Urine Culture     Urine sample is only showing glucose (this is expected since you are on Farxiga). We will send it off for culture to be sure there is no UTI.  BMP earlier this week was fine Let's go ahead and treat musculoskeletal back pain.  Recommend as needed tylenol  Plan of rest, intermittent application of cold packs (later, may switch to heat, but do not sleep on heating pad) Home exercises discussed. Handout provided.  Proper lifting mechanics with avoidance of heavy lifting discussed.  Recommended Physical Therapy  Consider XRay studies if not improving   Patient aware of signs/symptoms requiring further/urgent evaluation.    No orders of the defined types were placed in this encounter.   Return if symptoms worsen or fail to improve.  Clayborne Dana, NP

## 2022-09-06 LAB — URINE CULTURE
MICRO NUMBER:: 14873828
Result:: NO GROWTH
SPECIMEN QUALITY:: ADEQUATE

## 2022-09-27 ENCOUNTER — Telehealth (HOSPITAL_COMMUNITY): Payer: Self-pay

## 2022-09-27 NOTE — Telephone Encounter (Signed)
Spoke to patient about cardioversion scheduled for Monday. Aware of time and place and to not miss any Eliquis doses. Has transportation to and from procedure.

## 2022-09-27 NOTE — Progress Notes (Signed)
Spoke with pt about pre-procedural instructions and instructed pt to arrive at hospital at 0630 on 09/30/22.   Instructed not to eat or drink anything after 0000 on 09/30/22.  Told pt that he is to have a driver and to have someone stay with him for 24 hours after the procedure.  Instructed to take eliquis on the morning of the procedure with a small sip of water.

## 2022-09-29 NOTE — Anesthesia Preprocedure Evaluation (Signed)
Anesthesia Evaluation  Patient identified by MRN, date of birth, ID band Patient awake    Reviewed: Allergy & Precautions, NPO status , Patient's Chart, lab work & pertinent test results  History of Anesthesia Complications Negative for: history of anesthetic complications  Airway Mallampati: II  TM Distance: >3 FB Neck ROM: Full    Dental no notable dental hx.    Pulmonary neg pulmonary ROS   Pulmonary exam normal        Cardiovascular hypertension, Pt. on medications + CAD and + DVT (2016)  Normal cardiovascular exam+ dysrhythmias (on Eliquis) Atrial Fibrillation   TTE 04/2021: EF 60-65%, mild LVH, RV systolic function mildly reduced, mild pHTN (RVSP 37.7 mmHg), mild LAE, mild dilatation of aortic root/ascending aorta    Neuro/Psych negative neurological ROS  negative psych ROS   GI/Hepatic negative GI ROS, Neg liver ROS,,,  Endo/Other  negative endocrine ROS    Renal/GU negative Renal ROS  negative genitourinary   Musculoskeletal negative musculoskeletal ROS (+)    Abdominal   Peds  Hematology negative hematology ROS (+)   Anesthesia Other Findings Day of surgery medications reviewed with patient.  Reproductive/Obstetrics negative OB ROS                              Anesthesia Physical Anesthesia Plan  ASA: 3  Anesthesia Plan: General   Post-op Pain Management: Minimal or no pain anticipated   Induction: Intravenous  PONV Risk Score and Plan: 2 and Treatment may vary due to age or medical condition and Propofol infusion  Airway Management Planned: Mask  Additional Equipment: None  Intra-op Plan:   Post-operative Plan:   Informed Consent: I have reviewed the patients History and Physical, chart, labs and discussed the procedure including the risks, benefits and alternatives for the proposed anesthesia with the patient or authorized representative who has indicated  his/her understanding and acceptance.       Plan Discussed with: CRNA  Anesthesia Plan Comments:          Anesthesia Quick Evaluation

## 2022-09-30 ENCOUNTER — Encounter (HOSPITAL_COMMUNITY)
Admission: RE | Disposition: A | Discharge status: Home / Self Care | Payer: Self-pay | Source: Home / Self Care | Attending: Cardiology

## 2022-09-30 ENCOUNTER — Ambulatory Visit (HOSPITAL_BASED_OUTPATIENT_CLINIC_OR_DEPARTMENT_OTHER): Payer: Medicare HMO | Admitting: General Practice

## 2022-09-30 ENCOUNTER — Ambulatory Visit (HOSPITAL_COMMUNITY): Payer: Medicare HMO | Admitting: General Practice

## 2022-09-30 ENCOUNTER — Other Ambulatory Visit: Payer: Self-pay

## 2022-09-30 ENCOUNTER — Ambulatory Visit (HOSPITAL_COMMUNITY)
Admission: RE | Admit: 2022-09-30 | Discharge: 2022-09-30 | Disposition: A | Discharge status: Home / Self Care | Payer: Medicare HMO | Attending: Cardiology | Admitting: Cardiology

## 2022-09-30 DIAGNOSIS — I10 Essential (primary) hypertension: Secondary | ICD-10-CM

## 2022-09-30 DIAGNOSIS — J8489 Other specified interstitial pulmonary diseases: Secondary | ICD-10-CM | POA: Insufficient documentation

## 2022-09-30 DIAGNOSIS — G4733 Obstructive sleep apnea (adult) (pediatric): Secondary | ICD-10-CM | POA: Insufficient documentation

## 2022-09-30 DIAGNOSIS — I5032 Chronic diastolic (congestive) heart failure: Secondary | ICD-10-CM | POA: Diagnosis not present

## 2022-09-30 DIAGNOSIS — Z7982 Long term (current) use of aspirin: Secondary | ICD-10-CM | POA: Insufficient documentation

## 2022-09-30 DIAGNOSIS — Z79899 Other long term (current) drug therapy: Secondary | ICD-10-CM | POA: Insufficient documentation

## 2022-09-30 DIAGNOSIS — I110 Hypertensive heart disease with heart failure: Secondary | ICD-10-CM | POA: Diagnosis not present

## 2022-09-30 DIAGNOSIS — I4891 Unspecified atrial fibrillation: Secondary | ICD-10-CM

## 2022-09-30 DIAGNOSIS — Z86718 Personal history of other venous thrombosis and embolism: Secondary | ICD-10-CM | POA: Diagnosis not present

## 2022-09-30 DIAGNOSIS — I480 Paroxysmal atrial fibrillation: Secondary | ICD-10-CM

## 2022-09-30 DIAGNOSIS — I2510 Atherosclerotic heart disease of native coronary artery without angina pectoris: Secondary | ICD-10-CM | POA: Insufficient documentation

## 2022-09-30 HISTORY — PX: CARDIOVERSION: SHX1299

## 2022-09-30 SURGERY — CARDIOVERSION
Anesthesia: General

## 2022-09-30 MED ORDER — LACTATED RINGERS IV SOLN: INTRAVENOUS | Status: DC | PRN

## 2022-09-30 MED ORDER — LIDOCAINE 2% (20 MG/ML) 5 ML SYRINGE
INTRAMUSCULAR | Status: DC | PRN
  Administered 2022-09-30: 60 mg via INTRAVENOUS

## 2022-09-30 MED ORDER — PROPOFOL 10 MG/ML IV BOLUS
INTRAVENOUS | Status: DC | PRN
  Administered 2022-09-30: 20 mg via INTRAVENOUS
  Administered 2022-09-30: 70 mg via INTRAVENOUS
  Administered 2022-09-30: 30 mg via INTRAVENOUS
  Administered 2022-09-30: 10 mg via INTRAVENOUS
  Administered 2022-09-30: 30 mg via INTRAVENOUS

## 2022-09-30 SURGICAL SUPPLY — 1 items: ELECT DEFIB PAD ADLT CADENCE (PAD) ×2 IMPLANT

## 2022-09-30 NOTE — Anesthesia Postprocedure Evaluation (Signed)
Anesthesia Post Note  Patient: Christian [name]  Procedure(s) Performed: CARDIOVERSION     Patient location during evaluation: PACU Anesthesia Type: General Level of consciousness: awake and alert Pain management: pain level controlled Vital Signs Assessment: post-procedure vital signs reviewed and stable Respiratory status: spontaneous breathing, nonlabored ventilation and respiratory function stable Cardiovascular status: blood pressure returned to baseline Postop Assessment: no apparent nausea or vomiting Anesthetic complications: no   No notable events documented.  Last Vitals:  Vitals:   09/30/22 0658 09/30/22 0748  BP:  100/65  Pulse:  65  Resp:  15  Temp: 36.7 C   SpO2:  94%    Last Pain:  Vitals:   09/30/22 0748  TempSrc:   PainSc: 0-No pain                 Shanda Howells

## 2022-09-30 NOTE — Transfer of Care (Signed)
Immediate Anesthesia Transfer of Care Note  Patient: Christian [name]  Procedure(s) Performed: CARDIOVERSION  Patient Location: Cath Lab  Anesthesia Type:General  Level of Consciousness: awake  Airway & Oxygen Therapy: Patient Spontanous Breathing and Patient connected to nasal cannula oxygen  Post-op Assessment: Report given to RN and Post -op Vital signs reviewed and stable  Post vital signs: Reviewed and stable  Last Vitals:  Vitals Value Taken Time  BP 101/66 (77)   Temp    Pulse 64   Resp 16   SpO2 96     Last Pain:  Vitals:   09/30/22 0658  TempSrc: Temporal  PainSc: 0-No pain         Complications: No notable events documented.

## 2022-09-30 NOTE — Discharge Instructions (Signed)

## 2022-09-30 NOTE — Interval H&P Note (Signed)
History and Physical Interval Note:  09/30/2022 7:24 AM  Christian [name]  has presented today for surgery, with the diagnosis of AFIB.  The various methods of treatment have been discussed with the patient and family. After consideration of risks, benefits and other options for treatment, the patient has consented to  Procedure(s): CARDIOVERSION (N/A) as a surgical intervention.  The patient's history has been reviewed, patient examined, no change in status, stable for surgery.  I have reviewed the patient's chart and labs.  Questions were answered to the patient's satisfaction.     Mertha Clyatt [employer]

## 2022-09-30 NOTE — Anesthesia Procedure Notes (Signed)
Procedure Name: MAC Date/Time: 09/30/2022 7:29 AM  Performed by: Maxine Glenn, CRNAPre-anesthesia Checklist: Patient identified, Emergency Drugs available, Suction available and Patient being monitored Patient Re-evaluated:Patient Re-evaluated prior to induction Oxygen Delivery Method: Nasal cannula Induction Type: IV induction

## 2022-09-30 NOTE — Procedures (Signed)
Electrical Cardioversion Procedure Note Christian [name] [id number] 06-04-1945  Procedure: Electrical Cardioversion Indications:  Atrial Fibrillation  Procedure Details Consent: Risks of procedure as well as the alternatives and risks of each were explained to the (patient/caregiver).  Consent for procedure obtained. Time Out: Verified patient identification, verified procedure, site/side was marked, verified correct patient position, special equipment/implants available, medications/allergies/relevent history reviewed, required imaging and test results available.  Performed  Patient placed on cardiac monitor, pulse oximetry, supplemental oxygen as necessary.  Sedation given:  Propofol Pacer pads placed anterior and posterior chest.  Cardioverted 2 time(s).  Cardioverted at 200J.  Evaluation Findings: Post procedure EKG shows: NSR Complications: None Patient did tolerate procedure well.   Christian [name] 09/30/2022, 7:45 AM

## 2022-10-01 ENCOUNTER — Encounter (HOSPITAL_COMMUNITY): Payer: Self-pay | Admitting: Cardiology

## 2022-10-16 NOTE — Progress Notes (Signed)
[facility] Note   PCP: Bradd Canary, MD Pulmonology: Dr. Marchelle Gearing HF Cardiology: Dr. Shirlee Latch  77 y.o. with history of HTN and OSA was found to have interstitial lung disease and was referred for workup of possible pulmonary hypertension by Dr. Marchelle Gearing.  Patient was generally doing well with minimal medical problems until 12/22.  At that time, he was hospitalized with chest pain.  Coronary CT was done during 12/22 admission showing nonobstructive CAD, but interstitial lung disease was incidentally noted on the CT.  Patient had an echo showing EF 60-65%, mild LVH, mildly decreased RV systolic function, PASP 38 mmHg, IVC dilated.  He was referred to Dr. 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  Autoimmune serologies were weakly positive for ANA and P-ANCA. He was referred here due to at least mild pulmonary hypertension by echo with mildly decreased RV function.   RHC was done in 7/23 showing mildly elevated PCWP, normal RA pressure, very mild pulmonary venous hypertension likely from diastolic CHF.   Patient has not started anti-fibrotic meds at this point, plan for watchful waiting.   He was seen by Dr. Marchelle Gearing 08/27/22 and was noted to be tachycardic. EKG was obtained and showed possible atrial fibrillation w/ CVC, no discernable p-waves. Subsequently sent back to [facility] for further assessment.   Follow up 4/24, found to be in new atrial fibrillation. He was started on Eliquis (ASA stopped) and arranged for DCCV after 4 weeks of AC. Underwent DCCV 09/30/22 to NSR.   Today he returns for HF follow up with his wife. Overall feeling fine. He has no SOB walking his dog (up and downhill). Main complaint is increased urinary frequency, interfering with ADLs. No other GU symptoms, saw PCP a few weeks back and had u/a with was clear. Denies palpitations, abnormal bleeding, CP, dizziness, edema, or PND/Orthopnea. Appetite ok. No  fever or chills. Weight at home 217 pounds. Taking all medications. Home BP 115-120's/60-70s.  Labs (12/22): K 4, creatinine 0.78, LDL 109 Labs (7/23): LDL 70 Labs (8/23): K 3.8, creatinine 0.86 Labs (3/24): LDL 72, K 4.1, creatinine 0.9 Labs (4/24): K 3.9, creatinine 0.88  PMH: 1. OSA: CPAP 2. HTN 3. Hyperlipidemia 4. Ascending aortic aneurysm: 4.2 cm ascending aorta on 12/22 CT chest.  5. Interstitial lung disease: This was noted on high resolution CT chest in 1/23.  NSIP pattern.   - RF-, SCL-70-, SSA/B-, ESR 51, ANA 1:80, P-ANCA 1:40 - Echo (12/22): EF 60-65%, mild LVH, mildly decreased RV systolic function, PASP 38 mmHg, IVC dilated.  6. CAD: Coronary CTA (12/22) with calcium score 848 Agatston units (74th percentile), mild nonobstructive CAD. 7. Chronic diastolic CHF: RHC (7/23) with mean RA 6, PA 36/16 mean 25, mean PCWP 17, CI 3.68, PVR 1 WU 8. Atrial fibrillation: Newly diagnosed 4/24. - s/p DCCV (09/30/22)  Social History   Socioeconomic History   Marital status: Married    Spouse name: Not on file   Number of children: 1   Years of education: Not on file   Highest education level: Associate degree: academic program  Occupational History   Occupation: Retired  Tobacco Use   Smoking status: Never   Smokeless tobacco: Never  Substance and Sexual Activity   Alcohol use: Yes    Alcohol/week: 0.0 standard drinks of alcohol    Comment: 2-3 glasses of wine every night   Drug use: No   Sexual activity: Yes    Comment:  lives with wife, retired from English as a second language teacher, no dietary restrictions  Other Topics Concern   Not on file  Social History Narrative   Not on file   Social Determinants of Health   Financial Resource Strain: Low Risk  (09/05/2022)   Overall Financial Resource Strain (CARDIA)    Difficulty of Paying Living Expenses: Not hard at all  Food Insecurity: No Food Insecurity (09/05/2022)   Hunger Vital Sign    Worried About Running Out of Food in the  Last Year: Never true    Ran Out of Food in the Last Year: Never true  Transportation Needs: No Transportation Needs (09/05/2022)   PRAPARE - Administrator, Civil Service (Medical): No    Lack of Transportation (Non-Medical): No  Physical Activity: Insufficiently Active (09/05/2022)   Exercise Vital Sign    Days of Exercise per Week: 7 days    Minutes of Exercise per Session: 20 min  Stress: No Stress Concern Present (09/05/2022)   [employer] of Occupational Health - Occupational Stress Questionnaire    Feeling of Stress : Not at all  Social Connections: Moderately Isolated (09/05/2022)   Social Connection and Isolation Panel [NHANES]    Frequency of Communication with Friends and Family: More than three times a week    Frequency of Social Gatherings with Friends and Family: Once a week    Attends Religious Services: Never    Database administrator or Organizations: No    Attends Engineer, structural: Not on file    Marital Status: Married  Catering manager Violence: Not on file   Family History  Problem Relation Age of Onset   Stroke Father        from surgery   Emphysema Father        smoker   Heart disease Father        carotid artery disease, vasculopathy   Allergic Disorder Sister    ROS: All systems reviewed and negative except as per HPI.   Current Outpatient Medications  Medication Sig Dispense Refill   apixaban (ELIQUIS) 5 MG TABS tablet Take 1 tablet (5 mg total) by mouth 2 (two) times daily. 60 tablet 11   FARXIGA 10 MG TABS tablet TAKE 1 TABLET BY MOUTH DAILY BEFORE BREAKFAST. 90 tablet 3   hydrochlorothiazide (HYDRODIURIL) 50 MG tablet TAKE 1 TABLET BY MOUTH EVERY DAY 90 tablet 2   losartan (COZAAR) 100 MG tablet TAKE 1 TABLET BY MOUTH EVERY DAY 90 tablet 1   Multiple Vitamin (MULTIVITAMIN) tablet Take 1 tablet by mouth daily.     Multiple Vitamins-Minerals (PRESERVISION AREDS 2 PO) Take 1 capsule by mouth 2 (two) times daily.      rosuvastatin (CRESTOR) 20 MG tablet Take 1 tablet (20 mg total) by mouth daily. Please schedule appointment for further refills 90 tablet 1   No current facility-administered medications for this encounter.   Wt Readings from Last 3 Encounters:  10/18/22 99.3 kg (219 lb)  09/30/22 99.8 kg (220 lb)  09/05/22 103.9 kg (229 lb)   BP 130/74   Pulse 74   Wt 99.3 kg (219 lb)   SpO2 96%   BMI 32.81 kg/m   PHYSICAL EXAM: General:  NAD. No resp difficulty, walked into clinic HEENT: Normal Neck: Supple. No JVD, thick neck. Carotids 2+ bilat; no bruits. No lymphadenopathy or thryomegaly appreciated. Cor: PMI nondisplaced. Regular rate & rhythm. No rubs, gallops or murmurs. Lungs: Clear Abdomen: Soft, obese, nontender, nondistended. No hepatosplenomegaly.  No bruits or masses. Good bowel sounds. Extremities: No cyanosis, clubbing, rash, edema Neuro: Alert & oriented x 3, cranial nerves grossly intact. Moves all 4 extremities w/o difficulty. Affect pleasant.  Assessment/Plan: 1. Atrial Fibrillation: new diagnosis. Duration unknown. Rates controlled in the 80s. Asymptomatic. S/p DCCV 5/24 to NSR. He is regular on exam today. - Continue Eliquis 5 mg bid. CBC today - Continue to limit ETOH intake.  - Continue CPAP nightly.  2. Interstitial lung disease: Noted by coronary CTA and confirmed on HRCT in 1/23.  NSIP pattern, weakly positive ANA and P-ANCA.  Has seen Dr. Marchelle Gearing, plan for now is watchful waiting (have not started anti-fibrotic meds).   3. Coronary artery disease: Hospitalized with noncardiac chest pain in 12/22.  Coronary CT with calcium score 848 Agatston units and mild nonobstructive CAD.  - No longer on ASA with need for AC. - Continue Crestor 20 mg daily, good lipids 3/24.  4. OSA: Continue CPAP.  5. HTN: BP controlled on current regimen.  - Continue losartan 100 mg daily.  - Decrease HCTZ to 25 mg daily with frequent urination, weight down 10 lbs. BMET and BNP today. 6.  Chronic diastolic CHF: Echo in 12/22 showed mild pulmonary hypertension (PASP 38 mmHg) with mild RV dilation. Patient had RHC in 7/23 which showed mildly elevated PCWP and PA pressure likely due to diastolic CHF (mild). NYHA I-II, he is not volume overloaded on exam. Weight down 10 lbs. - Continue Farxiga 10 mg daily. - Update echo.  Follow up in 6 months with Dr. Shirlee Latch. Update echo in meantime.  Anderson Malta [place], FNP-BC 10/18/2022

## 2022-10-18 ENCOUNTER — Ambulatory Visit (HOSPITAL_COMMUNITY)
Admission: RE | Admit: 2022-10-18 | Discharge: 2022-10-18 | Disposition: A | Discharge status: Home / Self Care | Payer: Medicare HMO | Source: Ambulatory Visit | Attending: Family Medicine | Admitting: Family Medicine

## 2022-10-18 ENCOUNTER — Telehealth (HOSPITAL_COMMUNITY): Payer: Self-pay

## 2022-10-18 ENCOUNTER — Encounter (HOSPITAL_COMMUNITY): Payer: Self-pay

## 2022-10-18 VITALS — BP 130/74 | HR 74 | Wt 219.0 lb

## 2022-10-18 DIAGNOSIS — Z7901 Long term (current) use of anticoagulants: Secondary | ICD-10-CM | POA: Insufficient documentation

## 2022-10-18 DIAGNOSIS — Z8249 Family history of ischemic heart disease and other diseases of the circulatory system: Secondary | ICD-10-CM | POA: Insufficient documentation

## 2022-10-18 DIAGNOSIS — J849 Interstitial pulmonary disease, unspecified: Secondary | ICD-10-CM | POA: Insufficient documentation

## 2022-10-18 DIAGNOSIS — I5032 Chronic diastolic (congestive) heart failure: Secondary | ICD-10-CM | POA: Insufficient documentation

## 2022-10-18 DIAGNOSIS — I110 Hypertensive heart disease with heart failure: Secondary | ICD-10-CM | POA: Insufficient documentation

## 2022-10-18 DIAGNOSIS — Z79899 Other long term (current) drug therapy: Secondary | ICD-10-CM | POA: Insufficient documentation

## 2022-10-18 DIAGNOSIS — I2510 Atherosclerotic heart disease of native coronary artery without angina pectoris: Secondary | ICD-10-CM | POA: Diagnosis not present

## 2022-10-18 DIAGNOSIS — I10 Essential (primary) hypertension: Secondary | ICD-10-CM

## 2022-10-18 DIAGNOSIS — G4733 Obstructive sleep apnea (adult) (pediatric): Secondary | ICD-10-CM | POA: Diagnosis not present

## 2022-10-18 DIAGNOSIS — I480 Paroxysmal atrial fibrillation: Secondary | ICD-10-CM | POA: Diagnosis not present

## 2022-10-18 LAB — BASIC METABOLIC PANEL
Anion gap: 10 (ref 5–15)
BUN: 10 mg/dL (ref 8–23)
CO2: 26 mmol/L (ref 22–32)
Calcium: 9.9 mg/dL (ref 8.9–10.3)
Chloride: 101 mmol/L (ref 98–111)
Creatinine, Ser: 0.85 mg/dL (ref 0.61–1.24)
GFR, Estimated: >60 mL/min (ref >60)
Glucose, Bld: 99 mg/dL (ref 70–99)
Potassium: 3.3 mmol/L — ABNORMAL LOW (ref 3.5–5.1)
Sodium: 137 mmol/L (ref 135–145)

## 2022-10-18 LAB — CBC
HCT: 44.9 % (ref 39.0–52.0)
Hemoglobin: 14.8 g/dL (ref 13.0–17.0)
MCH: 30.5 pg (ref 26.0–34.0)
MCHC: 33 g/dL (ref 30.0–36.0)
MCV: 92.6 fL (ref 80.0–100.0)
Platelets: 239 10*3/uL (ref 150–400)
RBC: 4.85 MIL/uL (ref 4.22–5.81)
RDW: 12.5 % (ref 11.5–15.5)
WBC: 6.6 10*3/uL (ref 4.0–10.5)
nRBC: 0 % (ref 0.0–0.2)

## 2022-10-18 LAB — BRAIN NATRIURETIC PEPTIDE: B Natriuretic Peptide: 67 pg/mL (ref 0.0–100.0)

## 2022-10-18 MED ORDER — HYDROCHLOROTHIAZIDE 25 MG PO TABS: 25.0000 mg | ORAL_TABLET | Freq: Every day | ORAL | 8 refills | Status: DC

## 2022-10-18 NOTE — Patient Instructions (Addendum)
Thank you for coming in today  If you had labs drawn today, any labs that are abnormal the clinic will call you No news is good news  Medications: DECREASE HCTZ 25 mg daily   Follow up appointments: Your physician has requested that you have an echocardiogram. Echocardiography is a painless test that uses sound waves to create images of your heart. It provides your doctor with information about the size and shape of your heart and how well your heart's chambers and valves are working. This procedure takes approximately one hour. There are no restrictions for this procedure.      Your physician recommends that you schedule a follow-up appointment in:  6 months with With Dr. Earlean Shawl will receive a reminder letter in the mail a few months in advance. If you don't receive a letter, please call our office to schedule the follow-up appointment.     Do the following things EVERYDAY: Weigh yourself in the morning before breakfast. Write it down and keep it in a log. Take your medicines as prescribed Eat low salt foods--Limit salt (sodium) to 2000 mg per day.  Stay as active as you can everyday Limit all fluids for the day to less than 2 liters   At the [facility], you and your health needs are our priority. As part of our continuing mission to provide you with exceptional heart care, we have created designated Provider Care Teams. These Care Teams include your primary Cardiologist (physician) and Advanced Practice Providers (APPs- Physician Assistants and Nurse Practitioners) who all work together to provide you with the care you need, when you need it.   You may see any of the following providers on your designated Care Team at your next follow up: Dr Arvilla Meres Dr [name] Dr. Marcos Eke, NP Robbie Lis, [place] [facility] [place], [place] Brynda Peon, NP Karle Plumber, PharmD   Please be sure to bring in all your medications  bottles to every appointment.    Thank you for choosing [facility]  If you have any questions or concerns before your next appointment please send [name] a message through [place] or call our office at [phone].    TO LEAVE A MESSAGE FOR THE NURSE SELECT OPTION 2, PLEASE LEAVE A MESSAGE INCLUDING: YOUR NAME DATE OF BIRTH CALL BACK NUMBER REASON FOR CALL**this is important as we prioritize the call backs  YOU WILL RECEIVE A CALL BACK THE SAME DAY AS LONG AS YOU CALL BEFORE 4:00 PM

## 2022-10-18 NOTE — Telephone Encounter (Addendum)
Pt aware, agreeable, and verbalized understanding  Labs scheduled   ----- Message from Jacklynn Ganong, FNP sent at 10/18/2022  2:43 PM EDT ----- K is low. We decreased his HCTZ today which should help keep K up.  Please repeat BMET in 1-2 weeks (before he leaves for vacation).

## 2022-10-29 ENCOUNTER — Ambulatory Visit (HOSPITAL_COMMUNITY)
Admission: RE | Admit: 2022-10-29 | Discharge: 2022-10-29 | Disposition: A | Discharge status: Home / Self Care | Payer: Medicare HMO | Source: Ambulatory Visit | Attending: Internal Medicine | Admitting: Internal Medicine

## 2022-10-29 ENCOUNTER — Telehealth (HOSPITAL_COMMUNITY): Payer: Self-pay | Admitting: Cardiology

## 2022-10-29 DIAGNOSIS — I5032 Chronic diastolic (congestive) heart failure: Secondary | ICD-10-CM | POA: Diagnosis not present

## 2022-10-29 LAB — BASIC METABOLIC PANEL
Anion gap: 10 (ref 5–15)
BUN: 8 mg/dL (ref 8–23)
CO2: 26 mmol/L (ref 22–32)
Calcium: 9.5 mg/dL (ref 8.9–10.3)
Chloride: 103 mmol/L (ref 98–111)
Creatinine, Ser: 0.88 mg/dL (ref 0.61–1.24)
GFR, Estimated: >60 mL/min (ref >60)
Glucose, Bld: 113 mg/dL — ABNORMAL HIGH (ref 70–99)
Potassium: 3.4 mmol/L — ABNORMAL LOW (ref 3.5–5.1)
Sodium: 139 mmol/L (ref 135–145)

## 2022-10-29 MED ORDER — POTASSIUM CHLORIDE CRYS ER 20 MEQ PO TBCR: 20.0000 meq | EXTENDED_RELEASE_TABLET | Freq: Every day | ORAL | 3 refills | Status: DC

## 2022-10-29 NOTE — Telephone Encounter (Signed)
-----   Message from Jacklynn Ganong, [place] sent at 10/29/2022 12:50 PM EDT ----- K a little better but still low. Start 20 KCL daily.   He can repeat BMET in 2 weeks

## 2022-10-29 NOTE — Telephone Encounter (Signed)
Pt aware  Pt will have labs done at [facility] @ [facility]

## 2022-10-30 ENCOUNTER — Other Ambulatory Visit (HOSPITAL_COMMUNITY): Payer: Self-pay

## 2022-10-30 MED ORDER — ROSUVASTATIN CALCIUM 20 MG PO TABS: 20.0000 mg | ORAL_TABLET | Freq: Every day | ORAL | 3 refills | Status: DC

## 2022-11-22 ENCOUNTER — Other Ambulatory Visit (HOSPITAL_COMMUNITY): Payer: Self-pay | Admitting: Family Medicine

## 2022-11-22 DIAGNOSIS — I5032 Chronic diastolic (congestive) heart failure: Secondary | ICD-10-CM | POA: Diagnosis not present

## 2022-11-23 LAB — BASIC METABOLIC PANEL
BUN/Creatinine Ratio: 13 (ref 10–24)
BUN: 11 mg/dL (ref 8–27)
CO2: 23 mmol/L (ref 20–29)
Calcium: 10 mg/dL (ref 8.6–10.2)
Chloride: 101 mmol/L (ref 96–106)
Creatinine, Ser: 0.86 mg/dL (ref 0.76–1.27)
Glucose: 117 mg/dL — ABNORMAL HIGH (ref 70–99)
Potassium: 4 mmol/L (ref 3.5–5.2)
Sodium: 141 mmol/L (ref 134–144)
eGFR: 89 mL/min/{1.73_m2} (ref >59)

## 2022-11-26 ENCOUNTER — Ambulatory Visit (HOSPITAL_COMMUNITY)
Admission: RE | Admit: 2022-11-26 | Discharge: 2022-11-26 | Disposition: A | Discharge status: Home / Self Care | Payer: Medicare HMO | Source: Ambulatory Visit | Attending: Family Medicine | Admitting: Family Medicine

## 2022-11-26 DIAGNOSIS — I110 Hypertensive heart disease with heart failure: Secondary | ICD-10-CM | POA: Diagnosis not present

## 2022-11-26 DIAGNOSIS — I5032 Chronic diastolic (congestive) heart failure: Secondary | ICD-10-CM | POA: Diagnosis not present

## 2022-11-26 LAB — ECHOCARDIOGRAM COMPLETE
AR max vel: 2.18 cm2
AV Area VTI: 2.38 cm2
AV Area mean vel: 2.35 cm2
AV Mean grad: 5 mmHg
AV Peak grad: 9.5 mmHg
Ao pk vel: 1.54 m/s
Area-P 1/2: 3.27 cm2
S' Lateral: 3.2 cm

## 2022-12-16 ENCOUNTER — Other Ambulatory Visit (HOSPITAL_COMMUNITY): Payer: Self-pay

## 2022-12-16 MED ORDER — ROSUVASTATIN CALCIUM 20 MG PO TABS: 20.0000 mg | ORAL_TABLET | Freq: Every day | ORAL | 3 refills | Status: DC

## 2022-12-19 DIAGNOSIS — L821 Other seborrheic keratosis: Secondary | ICD-10-CM | POA: Diagnosis not present

## 2022-12-19 DIAGNOSIS — L814 Other melanin hyperpigmentation: Secondary | ICD-10-CM | POA: Diagnosis not present

## 2022-12-19 DIAGNOSIS — D225 Melanocytic nevi of trunk: Secondary | ICD-10-CM | POA: Diagnosis not present

## 2022-12-19 DIAGNOSIS — L570 Actinic keratosis: Secondary | ICD-10-CM | POA: Diagnosis not present

## 2022-12-19 DIAGNOSIS — D3611 Benign neoplasm of peripheral nerves and autonomic nervous system of face, head, and neck: Secondary | ICD-10-CM | POA: Diagnosis not present

## 2022-12-19 DIAGNOSIS — C44712 Basal cell carcinoma of skin of right lower limb, including hip: Secondary | ICD-10-CM | POA: Diagnosis not present

## 2022-12-19 DIAGNOSIS — Z85828 Personal history of other malignant neoplasm of skin: Secondary | ICD-10-CM | POA: Diagnosis not present

## 2022-12-19 DIAGNOSIS — C44719 Basal cell carcinoma of skin of left lower limb, including hip: Secondary | ICD-10-CM | POA: Diagnosis not present

## 2022-12-19 DIAGNOSIS — D2261 Melanocytic nevi of right upper limb, including shoulder: Secondary | ICD-10-CM | POA: Diagnosis not present

## 2022-12-19 DIAGNOSIS — C44612 Basal cell carcinoma of skin of right upper limb, including shoulder: Secondary | ICD-10-CM | POA: Diagnosis not present

## 2023-01-01 DIAGNOSIS — C44712 Basal cell carcinoma of skin of right lower limb, including hip: Secondary | ICD-10-CM | POA: Diagnosis not present

## 2023-01-01 DIAGNOSIS — C44612 Basal cell carcinoma of skin of right upper limb, including shoulder: Secondary | ICD-10-CM | POA: Diagnosis not present

## 2023-01-01 DIAGNOSIS — C44719 Basal cell carcinoma of skin of left lower limb, including hip: Secondary | ICD-10-CM | POA: Diagnosis not present

## 2023-02-04 ENCOUNTER — Ambulatory Visit: Payer: Medicare HMO | Admitting: Family Medicine

## 2023-02-12 NOTE — Assessment & Plan Note (Signed)
Encourage heart healthy diet such as MIND or DASH diet, increase exercise, avoid trans fats, simple carbohydrates and processed foods, consider a krill or fish or flaxseed oil cap daily.  °

## 2023-02-12 NOTE — Assessment & Plan Note (Signed)
He has been walking 2-3 miles a day and eating well, avoiding most sugar. Encouraged ongoing efforts and monitor labs.

## 2023-02-12 NOTE — Assessment & Plan Note (Signed)
hgba1c acceptable, minimize simple carbs. Increase exercise as tolerated.  

## 2023-02-12 NOTE — Assessment & Plan Note (Signed)
Monitor and report any concerns, no changes to meds. Encouraged heart healthy diet such as the DASH diet and exercise as tolerated.  ?

## 2023-02-13 ENCOUNTER — Ambulatory Visit (INDEPENDENT_AMBULATORY_CARE_PROVIDER_SITE_OTHER): Payer: Medicare HMO | Admitting: Family Medicine

## 2023-02-13 VITALS — BP 124/72 | HR 79 | Temp 98.0°F | Resp 16 | Ht 68.0 in | Wt 221.0 lb

## 2023-02-13 DIAGNOSIS — E785 Hyperlipidemia, unspecified: Secondary | ICD-10-CM

## 2023-02-13 DIAGNOSIS — M5440 Lumbago with sciatica, unspecified side: Secondary | ICD-10-CM

## 2023-02-13 DIAGNOSIS — Z6835 Body mass index (BMI) 35.0-35.9, adult: Secondary | ICD-10-CM

## 2023-02-13 DIAGNOSIS — E66812 Obesity, class 2: Secondary | ICD-10-CM | POA: Diagnosis not present

## 2023-02-13 DIAGNOSIS — R739 Hyperglycemia, unspecified: Secondary | ICD-10-CM | POA: Diagnosis not present

## 2023-02-13 DIAGNOSIS — I10 Essential (primary) hypertension: Secondary | ICD-10-CM | POA: Diagnosis not present

## 2023-02-13 LAB — CBC WITH DIFFERENTIAL/PLATELET
Basophils Absolute: 0.1 10*3/uL (ref 0.0–0.1)
Basophils Relative: 1.2 % (ref 0.0–3.0)
Eosinophils Absolute: 0.2 10*3/uL (ref 0.0–0.7)
Eosinophils Relative: 3.6 % (ref 0.0–5.0)
HCT: 43.7 % (ref 39.0–52.0)
Hemoglobin: 14.1 g/dL (ref 13.0–17.0)
Lymphocytes Relative: 27 % (ref 12.0–46.0)
Lymphs Abs: 1.6 10*3/uL (ref 0.7–4.0)
MCHC: 32.3 g/dL (ref 30.0–36.0)
MCV: 94.6 fL (ref 78.0–100.0)
Monocytes Absolute: 0.7 10*3/uL (ref 0.1–1.0)
Monocytes Relative: 12.3 % — ABNORMAL HIGH (ref 3.0–12.0)
Neutro Abs: 3.2 10*3/uL (ref 1.4–7.7)
Neutrophils Relative %: 55.9 % (ref 43.0–77.0)
Platelets: 203 10*3/uL (ref 150.0–400.0)
RBC: 4.62 Mil/uL (ref 4.22–5.81)
RDW: 15.1 % (ref 11.5–15.5)
WBC: 5.8 10*3/uL (ref 4.0–10.5)

## 2023-02-13 LAB — COMPREHENSIVE METABOLIC PANEL
ALT: 24 U/L (ref 0–53)
AST: 22 U/L (ref 0–37)
Albumin: 4.4 g/dL (ref 3.5–5.2)
Alkaline Phosphatase: 76 U/L (ref 39–117)
BUN: 14 mg/dL (ref 6–23)
CO2: 30 meq/L (ref 19–32)
Calcium: 9.7 mg/dL (ref 8.4–10.5)
Chloride: 104 meq/L (ref 96–112)
Creatinine, Ser: 0.85 mg/dL (ref 0.40–1.50)
GFR: 83.48 mL/min (ref >60.00)
Glucose, Bld: 92 mg/dL (ref 70–99)
Potassium: 4.3 meq/L (ref 3.5–5.1)
Sodium: 140 meq/L (ref 135–145)
Total Bilirubin: 0.6 mg/dL (ref 0.2–1.2)
Total Protein: 7.6 g/dL (ref 6.0–8.3)

## 2023-02-13 LAB — TSH: TSH: 1.98 u[IU]/mL (ref 0.35–5.50)

## 2023-02-13 LAB — HEMOGLOBIN A1C: Hgb A1c MFr Bld: 5.6 % (ref 4.6–6.5)

## 2023-02-13 LAB — LIPID PANEL
Cholesterol: 138 mg/dL (ref 0–200)
HDL: 54.2 mg/dL (ref >39.00)
LDL Cholesterol: 63 mg/dL (ref 0–99)
NonHDL: 84.22
Total CHOL/HDL Ratio: 3
Triglycerides: 107 mg/dL (ref 0.0–149.0)
VLDL: 21.4 mg/dL (ref 0.0–40.0)

## 2023-02-13 MED ORDER — TIZANIDINE HCL 2 MG PO TABS: 1.0000 mg | ORAL_TABLET | Freq: Three times a day (TID) | ORAL | 1 refills | Status: DC | PRN

## 2023-02-13 NOTE — Patient Instructions (Addendum)
Boost Tetanus  Proceed with second Shingrix shotHypertension, Adult High blood pressure (hypertension) is when the force of blood pumping through the arteries is too strong. The arteries are the blood vessels that carry blood from the heart throughout the body. Hypertension forces the heart to work harder to pump blood and may cause arteries to become narrow or stiff. Untreated or uncontrolled hypertension can lead to a heart attack, heart failure, a stroke, kidney disease, and other problems. A blood pressure reading consists of a higher number over a lower number. Ideally, your blood pressure should be below 120/80. The first ("top") number is called the systolic pressure. It is a measure of the pressure in your arteries as your heart beats. The second ("bottom") number is called the diastolic pressure. It is a measure of the pressure in your arteries as the heart relaxes. What are the causes? The exact cause of this condition is not known. There are some conditions that result in high blood pressure. What increases the risk? Certain factors may make you more likely to develop high blood pressure. Some of these risk factors are under your control, including: Smoking. Not getting enough exercise or physical activity. Being overweight. Having too much fat, sugar, calories, or salt (sodium) in your diet. Drinking too much alcohol. Other risk factors include: Having a personal history of heart disease, diabetes, high cholesterol, or kidney disease. Stress. Having a family history of high blood pressure and high cholesterol. Having obstructive sleep apnea. Age. The risk increases with age. What are the signs or symptoms? High blood pressure may not cause symptoms. Very high blood pressure (hypertensive crisis) may cause: Headache. Fast or irregular heartbeats (palpitations). Shortness of breath. Nosebleed. Nausea and vomiting. Vision changes. Severe chest pain, dizziness, and seizures. How  is this diagnosed? This condition is diagnosed by measuring your blood pressure while you are seated, with your arm resting on a flat surface, your legs uncrossed, and your feet flat on the floor. The cuff of the blood pressure monitor will be placed directly against the skin of your upper arm at the level of your heart. Blood pressure should be measured at least twice using the same arm. Certain conditions can cause a difference in blood pressure between your right and left arms. If you have a high blood pressure reading during one visit or you have normal blood pressure with other risk factors, you may be asked to: Return on a different day to have your blood pressure checked again. Monitor your blood pressure at home for 1 week or longer. If you are diagnosed with hypertension, you may have other blood or imaging tests to help your health care provider understand your overall risk for other conditions. How is this treated? This condition is treated by making healthy lifestyle changes, such as eating healthy foods, exercising more, and reducing your alcohol intake. You may be referred for counseling on a healthy diet and physical activity. Your health care provider may prescribe medicine if lifestyle changes are not enough to get your blood pressure under control and if: Your systolic blood pressure is above 130. Your diastolic blood pressure is above 80. Your personal target blood pressure may vary depending on your medical conditions, your age, and other factors. Follow these instructions at home: Eating and drinking  Eat a diet that is high in fiber and potassium, and low in sodium, added sugar, and fat. An example of this eating plan is called the DASH diet. DASH stands for Dietary Approaches to  Stop Hypertension. To eat this way: Eat plenty of fresh fruits and vegetables. Try to fill one half of your plate at each meal with fruits and vegetables. Eat whole grains, such as whole-wheat pasta,  brown rice, or whole-grain bread. Fill about one fourth of your plate with whole grains. Eat or drink low-fat dairy products, such as skim milk or low-fat yogurt. Avoid fatty cuts of meat, processed or cured meats, and poultry with skin. Fill about one fourth of your plate with lean proteins, such as fish, chicken without skin, beans, eggs, or tofu. Avoid pre-made and processed foods. These tend to be higher in sodium, added sugar, and fat. Reduce your daily sodium intake. Many people with hypertension should eat less than 1,500 mg of sodium a day. Do not drink alcohol if: Your health care provider tells you not to drink. You are pregnant, may be pregnant, or are planning to become pregnant. If you drink alcohol: Limit how much you have to: 0-1 drink a day for women. 0-2 drinks a day for men. Know how much alcohol is in your drink. In the [place] one drink equals one 12 oz bottle of beer (355 mL), one 5 oz glass of wine (148 mL), or one 1 oz glass of hard liquor (44 mL). Lifestyle  Work with your health care provider to maintain a healthy body weight or to lose weight. Ask what an ideal weight is for you. Get at least 30 minutes of exercise that causes your heart to beat faster (aerobic exercise) most days of the week. Activities may include walking, swimming, or biking. Include exercise to strengthen your muscles (resistance exercise), such as Pilates or lifting weights, as part of your weekly exercise routine. Try to do these types of exercises for 30 minutes at least 3 days a week. Do not use any products that contain nicotine or tobacco. These products include cigarettes, chewing tobacco, and vaping devices, such as e-cigarettes. If you need help quitting, ask your health care provider. Monitor your blood pressure at home as told by your health care provider. Keep all follow-up visits. This is important. Medicines Take over-the-counter and prescription medicines only as told by your health  care provider. Follow directions carefully. Blood pressure medicines must be taken as prescribed. Do not skip doses of blood pressure medicine. Doing this puts you at risk for problems and can make the medicine less effective. Ask your health care provider about side effects or reactions to medicines that you should watch for. Contact a health care provider if you: Think you are having a reaction to a medicine you are taking. Have headaches that keep coming back (recurring). Feel dizzy. Have swelling in your ankles. Have trouble with your vision. Get help right away if you: Develop a severe headache or confusion. Have unusual weakness or numbness. Feel faint. Have severe pain in your chest or abdomen. Vomit repeatedly. Have trouble breathing. These symptoms may be an emergency. Get help right away. Call 911. Do not wait to see if the symptoms will go away. Do not drive yourself to the hospital. Summary Hypertension is when the force of blood pumping through your arteries is too strong. If this condition is not controlled, it may put you at risk for serious complications. Your personal target blood pressure may vary depending on your medical conditions, your age, and other factors. For most people, a normal blood pressure is less than 120/80. Hypertension is treated with lifestyle changes, medicines, or a combination of both. Lifestyle  changes include losing weight, eating a healthy, low-sodium diet, exercising more, and limiting alcohol. This information is not intended to replace advice given to you by your health care provider. Make sure you discuss any questions you have with your health care provider. Document Revised: 03/06/2021 Document Reviewed: 03/06/2021 Elsevier Patient Education  2024 [employer].

## 2023-02-13 NOTE — Assessment & Plan Note (Addendum)
He hurt his lower back lifting and cleaning. This was a couple of weeks ago. He has had a couple of chiropractic adjustments and he is mostly recovered Encouraged moist heat and gentle stretching as tolerated. May try NSAIDs and prescription meds as directed and report if symptoms worsen or seek immediate care

## 2023-02-16 ENCOUNTER — Encounter: Payer: Self-pay | Admitting: Family Medicine

## 2023-02-16 NOTE — Progress Notes (Signed)
Subjective:    Patient ID: Christian [name], male    DOB: 02/08/1946, 77 y.o.   MRN: [id number]  Chief Complaint  Patient presents with  . Follow-up    Follow up    HPI Discussed the use of AI scribe software for clinical note transcription with the patient, who gave verbal consent to proceed.  History of Present Illness   The patient, with a history of hypertension, hyperlipidemia, diabetes, and anticoagulation therapy, presents with concerns about the number of medications they are currently taking. They have been seeing multiple specialists who have each prescribed different medications. The patient feels healthy and has not experienced any adverse effects from their medications. They have been making efforts to stay active and have lost weight, which they attribute to a combination of their medication regimen and their physical activity.  The patient also has a history of skin cancer and has recently had some skin lesions removed. They have been seeing a dermatologist regularly for monitoring and treatment. The patient reports that their back was recently injured, but they have been receiving chiropractic treatment and the pain has improved.        Past Medical History:  Diagnosis Date  . Back pain 06/04/2015  . DVT of lower limb, acute (HCC) 01/13/2015  . High blood pressure   . High cholesterol   . History of chicken pox 01/13/2015  . Hyperlipidemia, mild 01/13/2015  . Hypokalemia 01/13/2015  . Obesity   . Verrucous skin lesion 02/08/2016    Past Surgical History:  Procedure Laterality Date  . CARDIOVERSION N/A 09/30/2022   Procedure: CARDIOVERSION;  Surgeon: Laurey Morale, MD;  Location[facility] INVASIVE CV LAB;  Service: Cardiovascular;  Laterality: N/A;  . EYE SURGERY Bilateral 02/2019   Dr Vonna Kotyk  . RIGHT HEART CATH N/A 12/04/2021   Procedure: RIGHT HEART CATH;  Surgeon: Laurey Morale, MD;  Location: [facility] INVASIVE CV LAB;  Service: Cardiovascular;  Laterality: N/A;    Family  History  Problem Relation Age of Onset  . Stroke Father        from surgery  . Emphysema Father        smoker  . Heart disease Father        carotid artery disease, vasculopathy  . Allergic Disorder Sister     Social History   Socioeconomic History  . Marital status: Married    Spouse name: Not on file  . Number of children: 1  . Years of education: Not on file  . Highest education level: Associate degree: academic program  Occupational History  . Occupation: Retired  Tobacco Use  . Smoking status: Never  . Smokeless tobacco: Never  Substance and Sexual Activity  . Alcohol use: Yes    Alcohol/week: 0.0 standard drinks of alcohol    Comment: 2-3 glasses of wine every night  . Drug use: No  . Sexual activity: Yes    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, retired from English as a second language teacher, no dietary restrictions  Other Topics Concern  . Not on file  Social History Narrative  . Not on file   Social Determinants of Health   Financial Resource Strain: Low Risk  (09/05/2022)   Overall Financial Resource Strain (CARDIA)   . Difficulty of Paying Living Expenses: Not hard at all  Food Insecurity: No Food Insecurity (09/05/2022)   Hunger Vital Sign   . Worried About Programme researcher, broadcasting/film/video in the Last Year: Never true   . Ran Out of Food  in the Last Year: Never true  Transportation Needs: No Transportation Needs (09/05/2022)   PRAPARE - Transportation   . Lack of Transportation (Medical): No   . Lack of Transportation (Non-Medical): No  Physical Activity: Insufficiently Active (09/05/2022)   Exercise Vital Sign   . Days of Exercise per Week: 7 days   . Minutes of Exercise per Session: 20 min  Stress: No Stress Concern Present (09/05/2022)   [employer] of Occupational Health - Occupational Stress Questionnaire   . Feeling of Stress : Not at all  Social Connections: Moderately Isolated (09/05/2022)   Social Connection and Isolation Panel [NHANES]   . Frequency of Communication with  Friends and Family: More than three times a week   . Frequency of Social Gatherings with Friends and Family: Once a week   . Attends Religious Services: Never   . Active Member of Clubs or Organizations: No   . Attends Banker Meetings: Not on file   . Marital Status: Married  Catering manager Violence: Not on file    Outpatient Medications Prior to Visit  Medication Sig Dispense Refill  . apixaban (ELIQUIS) 5 MG TABS tablet Take 1 tablet (5 mg total) by mouth 2 (two) times daily. 60 tablet 11  . FARXIGA 10 MG TABS tablet TAKE 1 TABLET BY MOUTH DAILY BEFORE BREAKFAST. 90 tablet 3  . losartan (COZAAR) 100 MG tablet TAKE 1 TABLET BY MOUTH EVERY DAY 90 tablet 1  . Multiple Vitamins-Minerals (PRESERVISION AREDS 2 PO) Take 1 capsule by mouth 2 (two) times daily.    . potassium chloride SA (KLOR-CON M) 20 MEQ tablet Take 1 tablet (20 mEq total) by mouth daily. 90 tablet 3  . rosuvastatin (CRESTOR) 20 MG tablet Take 1 tablet (20 mg total) by mouth daily. 90 tablet 3  . hydrochlorothiazide (HYDRODIURIL) 25 MG tablet Take 1 tablet (25 mg total) by mouth daily. 30 tablet 8  . Multiple Vitamin (MULTIVITAMIN) tablet Take 1 tablet by mouth daily.     No facility-administered medications prior to visit.    Allergies  Allergen Reactions  . Codeine Anaphylaxis    Review of Systems  Constitutional:  Negative for fever and malaise/fatigue.  HENT:  Negative for congestion.   Eyes:  Negative for blurred vision.  Respiratory:  Negative for shortness of breath.   Cardiovascular:  Negative for chest pain, palpitations and leg swelling.  Gastrointestinal:  Negative for abdominal pain, blood in stool and nausea.  Genitourinary:  Negative for dysuria and frequency.  Musculoskeletal:  Negative for falls.  Skin:  Negative for rash.  Neurological:  Negative for dizziness, loss of consciousness and headaches.  Endo/Heme/Allergies:  Negative for environmental allergies.  Psychiatric/Behavioral:   Negative for depression. The patient is not nervous/anxious.       Objective:    Physical Exam Vitals reviewed.  Constitutional:      Appearance: Normal appearance. He is not ill-appearing.  HENT:     Head: Normocephalic and atraumatic.     Nose: Nose normal.  Eyes:     Conjunctiva/sclera: Conjunctivae normal.  Cardiovascular:     Rate and Rhythm: Normal rate.     Pulses: Normal pulses.     Heart sounds: Normal heart sounds. No murmur heard. Pulmonary:     Effort: Pulmonary effort is normal.     Breath sounds: Normal breath sounds. No wheezing.  Abdominal:     Palpations: Abdomen is soft. There is no mass.     Tenderness: There is no abdominal  tenderness.  Musculoskeletal:     Cervical back: Normal range of motion.     Right lower leg: No edema.     Left lower leg: No edema.  Skin:    General: Skin is warm and dry.  Neurological:     General: No focal deficit present.     Mental Status: He is alert and oriented to person, place, and time.  Psychiatric:        Mood and Affect: Mood normal.   BP 124/72 (BP Location: Left Arm, Patient Position: Sitting, Cuff Size: Normal)   Pulse 79   Temp 98 F (36.7 C) (Oral)   Resp 16   Ht 5\' 8"  (1.727 m)   Wt 221 lb (100.2 kg)   SpO2 96%   BMI 33.60 kg/m  Wt Readings from Last 3 Encounters:  02/13/23 221 lb (100.2 kg)  10/18/22 219 lb (99.3 kg)  09/30/22 220 lb (99.8 kg)    Diabetic Foot Exam - Simple   No data filed    Lab Results  Component Value Date   WBC 5.8 02/13/2023   HGB 14.1 02/13/2023   HCT 43.7 02/13/2023   PLT 203.0 02/13/2023   GLUCOSE 92 02/13/2023   CHOL 138 02/13/2023   TRIG 107.0 02/13/2023   HDL 54.20 02/13/2023   LDLCALC 63 02/13/2023   ALT 24 02/13/2023   AST 22 02/13/2023   NA 140 02/13/2023   K 4.3 02/13/2023   CL 104 02/13/2023   CREATININE 0.85 02/13/2023   BUN 14 02/13/2023   CO2 30 02/13/2023   TSH 1.98 02/13/2023   PSA 1.57 08/30/2020   HGBA1C 5.6 02/13/2023    Lab Results   Component Value Date   TSH 1.98 02/13/2023   Lab Results  Component Value Date   WBC 5.8 02/13/2023   HGB 14.1 02/13/2023   HCT 43.7 02/13/2023   MCV 94.6 02/13/2023   PLT 203.0 02/13/2023   Lab Results  Component Value Date   NA 140 02/13/2023   K 4.3 02/13/2023   CO2 30 02/13/2023   GLUCOSE 92 02/13/2023   BUN 14 02/13/2023   CREATININE 0.85 02/13/2023   BILITOT 0.6 02/13/2023   ALKPHOS 76 02/13/2023   AST 22 02/13/2023   ALT 24 02/13/2023   PROT 7.6 02/13/2023   ALBUMIN 4.4 02/13/2023   CALCIUM 9.7 02/13/2023   ANIONGAP 10 10/29/2022   EGFR 89 11/22/2022   GFR 83.48 02/13/2023   Lab Results  Component Value Date   CHOL 138 02/13/2023   Lab Results  Component Value Date   HDL 54.20 02/13/2023   Lab Results  Component Value Date   LDLCALC 63 02/13/2023   Lab Results  Component Value Date   TRIG 107.0 02/13/2023   Lab Results  Component Value Date   CHOLHDL 3 02/13/2023   Lab Results  Component Value Date   HGBA1C 5.6 02/13/2023       Assessment & Plan:  Benign essential HTN Assessment & Plan: Monitor and report any concerns, no changes to meds. Encouraged heart healthy diet such as the DASH diet and exercise as tolerated.   Orders: -     Comprehensive metabolic panel -     CBC with Differential/Platelet -     TSH  Hyperglycemia Assessment & Plan: hgba1c acceptable, minimize simple carbs. Increase exercise as tolerated.   Orders: -     Comprehensive metabolic panel -     CBC with Differential/Platelet -     Hemoglobin A1c -  TSH  Hyperlipidemia, mild Assessment & Plan: Encourage heart healthy diet such as MIND or DASH diet, increase exercise, avoid trans fats, simple carbohydrates and processed foods, consider a krill or fish or flaxseed oil cap daily.   Orders: -     Lipid panel  Class 2 obesity with body mass index (BMI) of 35.0 to 35.9 in adult, unspecified obesity type, unspecified whether serious comorbidity  present Assessment & Plan: He has been walking 2-3 miles a day and eating well, avoiding most sugar. Encouraged ongoing efforts and monitor labs.    Midline low back pain with sciatica, sciatica laterality unspecified, unspecified chronicity Assessment & Plan: He hurt his lower back lifting and cleaning. This was a couple of weeks ago. He has had a couple of chiropractic adjustments and he is mostly recovered Encouraged moist heat and gentle stretching as tolerated. May try NSAIDs and prescription meds as directed and report if symptoms worsen or seek immediate care    Other orders -     tiZANidine HCl; Take 0.5-2 tablets (1-4 mg total) by mouth every 8 (eight) hours as needed for muscle spasms.  Dispense: 30 tablet; Refill: 1    Assessment and Plan    Polypharmacy Concerns about the number of medications. Discussed the importance and benefits of each medication for managing his various conditions including hypertension, hyperlipidemia, diabetes, and anticoagulation. -Continue current medications as prescribed.  Weight Loss Noted significant weight loss since last visit, likely due to increased physical activity and possibly the addition of Farxiga. -Encouraged to continue current lifestyle modifications.  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 -Continue follow-up with dermatology as recommended.  Back Pain Recent back pain due to heavy lifting, improved with chiropractic adjustment. -Continue current management, seek medical attention if pain worsens or new symptoms develop.  General Health Maintenance -Order comprehensive blood work to monitor current conditions. -Recommend Tetanus booster and second Shingles vaccine at pharmacy. -Schedule follow-up visit in March for physical examination.         Danise Edge, MD

## 2023-03-07 ENCOUNTER — Ambulatory Visit (INDEPENDENT_AMBULATORY_CARE_PROVIDER_SITE_OTHER): Payer: Medicare HMO | Admitting: Family

## 2023-03-07 VITALS — BP 136/78 | HR 70 | Temp 98.4°F | Resp 16 | Ht 68.0 in | Wt 223.0 lb

## 2023-03-07 DIAGNOSIS — M545 Low back pain, unspecified: Secondary | ICD-10-CM | POA: Diagnosis not present

## 2023-03-07 DIAGNOSIS — I10 Essential (primary) hypertension: Secondary | ICD-10-CM | POA: Diagnosis not present

## 2023-03-07 MED ORDER — PREDNISONE 10 MG PO TABS: ORAL_TABLET | ORAL | 0 refills | Status: DC

## 2023-03-07 NOTE — Progress Notes (Unsigned)
Subjective:     Patient ID: Christian [name], male    DOB: 1946-03-19, 77 y.o.   MRN: [id number]  Chief Complaint  Patient presents with   Back Pain    Here for follow up, was  not able to continue zanaflex due to side effect.     HPI  Discussed the use of AI scribe software for clinical note transcription with the patient, who gave verbal consent to proceed.  History of Present Illness   The patient, with a history of back pain for the past six months, presents with a complaint of right lower back pain. The pain is described as a 'striking pain' that is noticeable when walking and standing up. The patient reports that the pain has been fluctuating in intensity over the past six months, with periods of improvement and worsening. The patient denies any weakness in the legs or changes in bowel or bladder function. The patient has been self-managing the pain with Tylenol and chiropractic adjustments, but reports minimal improvement. The patient also notes that the onset of the back pain coincided with the initiation of Eliquis therapy, but understands that this is likely a coincidence. The patient has been trying stretching exercises recommended by a physical therapist friend, but is unsure if these have been beneficial. The patient also mentions a change in mattresses due to back pain during sleep, but this issue seems to have resolved.          Health Maintenance Due  Topic Date Due   DTaP/Tdap/Td (2 - Tdap) 01/11/2017   Medicare Annual Wellness (AWV)  11/04/2017   Zoster Vaccines- Shingrix (2 of 2) 01/15/2021    Past Medical History:  Diagnosis Date   Back pain 06/04/2015   DVT of lower limb, acute (HCC) 01/13/2015   High blood pressure    High cholesterol    History of chicken pox 01/13/2015   Hyperlipidemia, mild 01/13/2015   Hypokalemia 01/13/2015   Obesity    Verrucous skin lesion 02/08/2016    Past Surgical History:  Procedure Laterality Date   CARDIOVERSION N/A 09/30/2022    Procedure: CARDIOVERSION;  Surgeon: Laurey Morale, MD;  Location: [facility] INVASIVE CV LAB;  Service: Cardiovascular;  Laterality: N/A;   EYE SURGERY Bilateral 02/2019   Dr Vonna Kotyk   RIGHT HEART CATH N/A 12/04/2021   Procedure: RIGHT HEART CATH;  Surgeon: Laurey Morale, MD;  Location: [facility] INVASIVE CV LAB;  Service: Cardiovascular;  Laterality: N/A;    Family History  Problem Relation Age of Onset   Stroke Father        from surgery   Emphysema Father        smoker   Heart disease Father        carotid artery disease, vasculopathy   Allergic Disorder Sister     Social History   Socioeconomic History   Marital status: Married    Spouse name: Not on file   Number of children: 1   Years of education: Not on file   Highest education level: Associate degree: academic program  Occupational History   Occupation: Retired  Tobacco Use   Smoking status: Never   Smokeless tobacco: Never  Substance and Sexual Activity   Alcohol use: Yes    Alcohol/week: 0.0 standard drinks of alcohol    Comment: 2-3 glasses of wine every night   Drug use: No   Sexual activity: Yes    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, retired from English as a second language teacher, no dietary restrictions  Other Topics Concern   Not on file  Social History Narrative   Not on file   Social Determinants of Health   Financial Resource Strain: Low Risk  (09/05/2022)   Overall Financial Resource Strain (CARDIA)    Difficulty of Paying Living Expenses: Not hard at all  Food Insecurity: No Food Insecurity (09/05/2022)   Hunger Vital Sign    Worried About Running Out of Food in the Last Year: Never true    Ran Out of Food in the Last Year: Never true  Transportation Needs: No Transportation Needs (09/05/2022)   PRAPARE - Administrator, Civil Service (Medical): No    Lack of Transportation (Non-Medical): No  Physical Activity: Insufficiently Active (09/05/2022)   Exercise Vital Sign    Days of Exercise per Week: 7 days     Minutes of Exercise per Session: 20 min  Stress: No Stress Concern Present (09/05/2022)   [employer] of Occupational Health - Occupational Stress Questionnaire    Feeling of Stress : Not at all  Social Connections: Moderately Isolated (09/05/2022)   Social Connection and Isolation Panel [NHANES]    Frequency of Communication with Friends and Family: More than three times a week    Frequency of Social Gatherings with Friends and Family: Once a week    Attends Religious Services: Never    Database administrator or Organizations: No    Attends Engineer, structural: Not on file    Marital Status: Married  Catering manager Violence: Not on file    Outpatient Medications Prior to Visit  Medication Sig Dispense Refill   apixaban (ELIQUIS) 5 MG TABS tablet Take 1 tablet (5 mg total) by mouth 2 (two) times daily. 60 tablet 11   FARXIGA 10 MG TABS tablet TAKE 1 TABLET BY MOUTH DAILY BEFORE BREAKFAST. 90 tablet 3   hydrochlorothiazide (HYDRODIURIL) 25 MG tablet Take 1 tablet (25 mg total) by mouth daily. 30 tablet 8   losartan (COZAAR) 100 MG tablet TAKE 1 TABLET BY MOUTH EVERY DAY 90 tablet 1   Multiple Vitamins-Minerals (PRESERVISION AREDS 2 PO) Take 1 capsule by mouth 2 (two) times daily.     potassium chloride SA (KLOR-CON M) 20 MEQ tablet Take 1 tablet (20 mEq total) by mouth daily. 90 tablet 3   rosuvastatin (CRESTOR) 20 MG tablet Take 1 tablet (20 mg total) by mouth daily. 90 tablet 3   tiZANidine (ZANAFLEX) 2 MG tablet Take 0.5-2 tablets (1-4 mg total) by mouth every 8 (eight) hours as needed for muscle spasms. 30 tablet 1   No facility-administered medications prior to visit.    Allergies  Allergen Reactions   Codeine Anaphylaxis    ROS See HPI     Objective:    Physical Exam Constitutional:      General: He is not in acute distress.    Appearance: He is well-developed.  HENT:     Head: Normocephalic and atraumatic.  Cardiovascular:     Rate and Rhythm:  Normal rate and regular rhythm.     Heart sounds: No murmur heard. Pulmonary:     Effort: Pulmonary effort is normal. No respiratory distress.     Breath sounds: Normal breath sounds. No wheezing or rales.  Skin:    General: Skin is warm and dry.  Neurological:     Mental Status: He is alert and oriented to person, place, and time.     Deep Tendon Reflexes:     Reflex Scores:  Patellar reflexes are 2+ on the right side and 2+ on the left side.    Comments: Bilateral LE strength is 5/5.    Psychiatric:        Behavior: Behavior normal.        Thought Content: Thought content normal.      BP 136/78   Pulse 70   Temp 98.4 F (36.9 C) (Oral)   Resp 16   Ht 5\' 8"  (1.727 m)   Wt 223 lb (101.2 kg)   SpO2 100%   BMI 33.91 kg/m  Wt Readings from Last 3 Encounters:  03/07/23 223 lb (101.2 kg)  02/13/23 221 lb (100.2 kg)  10/18/22 219 lb (99.3 kg)       Assessment & Plan:   Problem List Items Addressed This Visit       Unprioritized   Low back pain - Primary     Pain localized to the right lower back, ongoing for six months with varying intensity. No associated weakness or changes in bowel or bladder function. No relief with chiropractic adjustments. No contraindications to steroid use. -Prescribe a short course of Prednisone, tapered over a week. -Refer to physical therapy for core training and exercises. -If no improvement after six weeks of physical therapy, consider MRI.       Relevant Medications   predniSONE (DELTASONE) 10 MG tablet   Other Relevant Orders   Ambulatory referral to Physical Therapy   Benign essential HTN     Patient reports home readings of 125-135, but today's initial BP reading was 160. Patient is currently on Hydrochlorothiazide. -Recheck blood pressure at next visit.       I have discontinued Maisie Fus A. Wussow "Tom"'s tiZANidine. I am also having him start on predniSONE. Additionally, I am having him maintain his Multiple  Vitamins-Minerals (PRESERVISION AREDS 2 PO), Farxiga, losartan, apixaban, hydrochlorothiazide, potassium chloride SA, and rosuvastatin.  Meds ordered this encounter  Medications   predniSONE (DELTASONE) 10 MG tablet    Sig: 4 tabs by mouth once daily for 2 days, then 3 tabs daily x 2 days, then 2 tabs daily x 2 days, then 1 tab daily x 2 days    Dispense:  20 tablet    Refill:  0    Order Specific Question:   Supervising Provider    Answer:   Danise Edge A [[id number]]

## 2023-03-09 NOTE — Patient Instructions (Signed)
VISIT SUMMARY:  During today's visit, we discussed your ongoing right lower back pain, which has been present for the past six months. We also reviewed your blood pressure readings and continued your current medication for anticoagulation.  YOUR PLAN:  -CHRONIC LOWER BACK PAIN: Chronic lower back pain is a long-lasting pain in the lower back area. We will start a short course of Prednisone, a steroid, to help reduce inflammation and pain. You are also being referred to physical therapy for core training and exercises. If there is no improvement after six weeks of physical therapy, we will consider getting an MRI to further investigate the cause of your pain.  -HYPERTENSION: Hypertension, or high blood pressure, is when the force of the blood against your artery walls is too high. Your home readings have been between 125-135, but today's reading was 160. We will recheck your blood pressure at your next visit to monitor it closely.  -GENERAL HEALTH MAINTENANCE: Continue taking Eliquis as prescribed for anticoagulation to prevent blood clots.  INSTRUCTIONS:  Please follow the prescribed course of Prednisone and attend physical therapy sessions as referred. We will recheck your blood pressure at your next visit. If your back pain does not improve after six weeks of physical therapy, we will consider an MRI.

## 2023-03-09 NOTE — Assessment & Plan Note (Signed)
  Pain localized to the right lower back, ongoing for six months with varying intensity. No associated weakness or changes in bowel or bladder function. No relief with chiropractic adjustments. No contraindications to steroid use. -Prescribe a short course of Prednisone, tapered over a week. -Refer to physical therapy for core training and exercises. -If no improvement after six weeks of physical therapy, consider MRI.

## 2023-03-09 NOTE — Assessment & Plan Note (Signed)
  Patient reports home readings of 125-135, but today's initial BP reading was 160. Patient is currently on Hydrochlorothiazide. -Recheck blood pressure at next visit.

## 2023-03-11 ENCOUNTER — Other Ambulatory Visit (HOSPITAL_COMMUNITY): Payer: Self-pay | Admitting: Cardiology

## 2023-03-11 ENCOUNTER — Other Ambulatory Visit: Payer: Self-pay | Admitting: Family Medicine

## 2023-03-12 ENCOUNTER — Other Ambulatory Visit (HOSPITAL_COMMUNITY): Payer: Self-pay | Admitting: Cardiology

## 2023-03-12 MED ORDER — HYDROCHLOROTHIAZIDE 25 MG PO TABS: 25.0000 mg | ORAL_TABLET | Freq: Every day | ORAL | 3 refills | Status: DC

## 2023-03-13 ENCOUNTER — Telehealth: Payer: Self-pay | Admitting: Family Medicine

## 2023-03-13 DIAGNOSIS — M5416 Radiculopathy, lumbar region: Secondary | ICD-10-CM

## 2023-03-13 MED ORDER — GABAPENTIN 100 MG PO CAPS: 100.0000 mg | ORAL_CAPSULE | Freq: Three times a day (TID) | ORAL | 3 refills | Status: DC

## 2023-03-13 NOTE — Telephone Encounter (Signed)
Pt.notified

## 2023-03-13 NOTE — Telephone Encounter (Signed)
Pt said he was seen by another provider last week for his back pain and received prednisone. Pt said that the pain is not getting better. He said the pain is now migrating down to his groin area. Pt said he cannot get in to see a PT until after Thanksgiving. Pt said that [employer] advised insurance will not typically cover CT scan until after PT sessions have been completed but the pain is really bothersome. Please call patient to advise if there is something else that Dr. Abner Greenspan suggests that can be done. Possibly a referral to another location who can seem him earlier or something else to alleviate his pain.

## 2023-03-13 NOTE — Telephone Encounter (Signed)
We can call to see if we can get him in somewhere else sooner.  Is there anything else you would like to do since his pain has moved to groin area?

## 2023-03-13 NOTE — Telephone Encounter (Signed)
Pt called back to go over info. All CMAs were unavailable at the time and advised pt we would give him a call back at a later time.

## 2023-03-13 NOTE — Telephone Encounter (Signed)
Let's see if he is open to trying a different location for his PT.  Also, I will refer him to [facility] and send an rx for gabapentin for him to try for the pain.  If this does not help, please schedule a follow up with Dr. Abner Greenspan or myself.

## 2023-03-13 NOTE — Addendum Note (Signed)
Addended by: Sandford Craze on: 03/13/2023 10:31 AM   Modules accepted: Orders

## 2023-03-19 DIAGNOSIS — M47816 Spondylosis without myelopathy or radiculopathy, lumbar region: Secondary | ICD-10-CM | POA: Diagnosis not present

## 2023-03-30 ENCOUNTER — Other Ambulatory Visit (HOSPITAL_COMMUNITY): Payer: Self-pay | Admitting: Cardiology

## 2023-04-07 ENCOUNTER — Ambulatory Visit (HOSPITAL_BASED_OUTPATIENT_CLINIC_OR_DEPARTMENT_OTHER): Payer: Medicare HMO | Attending: Family | Admitting: Physical Therapy

## 2023-04-07 ENCOUNTER — Other Ambulatory Visit: Payer: Self-pay

## 2023-04-07 ENCOUNTER — Encounter (HOSPITAL_BASED_OUTPATIENT_CLINIC_OR_DEPARTMENT_OTHER): Payer: Self-pay | Admitting: Physical Therapy

## 2023-04-07 DIAGNOSIS — M545 Low back pain, unspecified: Secondary | ICD-10-CM | POA: Insufficient documentation

## 2023-04-07 DIAGNOSIS — M6281 Muscle weakness (generalized): Secondary | ICD-10-CM | POA: Diagnosis not present

## 2023-04-07 NOTE — Therapy (Signed)
OUTPATIENT PHYSICAL THERAPY THORACOLUMBAR EVALUATION   Patient Name: Christian [name] MRN: [id number] DOB:Sep 13, 1945, 77 y.o., male Today's Date: 04/07/2023  END OF SESSION:  PT End of Session - 04/07/23 1423     Visit Number 1    Number of Visits 12    Date for PT Re-Evaluation 07/06/23    Authorization Type [employer]    PT Start Time 1315    PT Stop Time 1410    PT Time Calculation (min) 55 min    Activity Tolerance Patient tolerated treatment well    Behavior During Therapy [facility] for tasks assessed/performed             Past Medical History:  Diagnosis Date   Back pain 06/04/2015   DVT of lower limb, acute (HCC) 01/13/2015   High blood pressure    High cholesterol    History of chicken pox 01/13/2015   Hyperlipidemia, mild 01/13/2015   Hypokalemia 01/13/2015   Obesity    Verrucous skin lesion 02/08/2016   Past Surgical History:  Procedure Laterality Date   CARDIOVERSION N/A 09/30/2022   Procedure: CARDIOVERSION;  Surgeon: Laurey Morale, MD;  Location: [facility] INVASIVE CV LAB;  Service: Cardiovascular;  Laterality: N/A;   EYE SURGERY Bilateral 02/2019   Dr Vonna Kotyk   RIGHT HEART CATH N/A 12/04/2021   Procedure: RIGHT HEART CATH;  Surgeon: Laurey Morale, MD;  Location: [facility] INVASIVE CV LAB;  Service: Cardiovascular;  Laterality: N/A;   Patient Active Problem List   Diagnosis Date Noted   Seborrheic dermatitis 04/27/2021   Coronary artery disease involving native heart without angina pectoris 04/27/2021   Osteoarthritis of knee 04/27/2021   Chest pressure 04/23/2021   Right hip pain 03/04/2021   Cataract 11/20/2017   Right shoulder pain 05/20/2017   Diverticulosis 05/20/2017   Preventative health care 05/20/2017   Verrucous skin lesion 02/08/2016   Low back pain 06/04/2015   Nonallergic rhinitis 02/10/2015   Sun-damaged skin 01/29/2015   Benign essential HTN 01/29/2015   Hyperglycemia 01/29/2015   Hyperlipidemia, mild 01/13/2015   History of chicken pox 01/13/2015    Hypokalemia 01/13/2015   Obesity    Obstructive sleep apnea 06/10/2014    PCP: Bradd Canary, MD   REFERRING PROVIDER:  Sandford Craze, NP     REFERRING DIAG:  M54.50 (ICD-10-CM) - Right-sided low back pain without sciatica, unspecified chronicity      Rationale for Evaluation and Treatment: Rehabilitation  THERAPY DIAG:  Pain, lumbar region - Plan: PT plan of care cert/re-cert  Muscle weakness (generalized) - Plan: PT plan of care cert/re-cert  ONSET DATE: May 2025  SUBJECTIVE:  SUBJECTIVE STATEMENT: Pt states he is here today for R sided LBP. Pt denies NT into the legs. Pt does not have sudden weakness into the legs. Pt does have particular difficulty during activity but things like mopping or vacuuming with cause and create pain. 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 Pain has been ongoing for about 6 months. Pt reports maybe lifting something that caused the initial pain. Recently, he started having a sharp, stabbing pain into the R low back and right above the hip. Feels he is more cautious now due to pain. Pt has a gym membership but does not actively use it. Pt reports gradual loss of strength over the years which limits his activity around the home vs back pain limiting.   Pt has seen chiro without much relief.   Pt reports that no issues identified with cardiac testing.   PERTINENT HISTORY:  HLD, HTN, skin cancer, knee OA  PAIN:  Are you having pain? No: NPRS scale: 0/10; 3/10 at worst  Pain location: R sided LBP Pain description: stabbing Aggravating factors: rotation, sitting and then transition  Relieving factors: gabapentin, nothing else helps pain   PRECAUTIONS: None  RED FLAGS: None   WEIGHT BEARING  RESTRICTIONS: No  FALLS:  Has patient fallen in last 6 months? No  LIVING ENVIRONMENT: Lives with: lives with their family and lives with their spouse Lives in: House/apartment Stairs: 3 steps to change floors  Has following equipment at home: None  OCCUPATION: retired  Recreation: walking, walking dogs   PLOF: Independent  PATIENT GOALS: reduce pain and return to normal activity  OBJECTIVE:  Note: Objective measures were completed at Evaluation unless otherwise noted.  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   PATIENT SURVEYS:  FOTO 65 69@ DC 5 pts MCII  SCREENING FOR RED FLAGS: Bowel or bladder incontinence: No Spinal tumors: No Cauda equina syndrome: No Compression fracture: No Abdominal aneurysm: No  COGNITION: Overall cognitive status: Within functional limits for tasks assessed     SENSATION: WFL  MUSCLE LENGTH: Hamstrings: positive supine 90/90 Positive Ely's  POSTURE: rounded shoulders and increased thoracic kyphosis  PALPATION: TTP and significant hypertoncity of bilat R>L lumbar paraspinals, QL, glutes and hip rotators  LUMBAR ROM:   AROM eval  Flexion 50%  Extension 25%  Right lateral flexion 75%  Left lateral flexion 75%  Right rotation 75%  Left rotation 75%   (Blank rows = not tested)  LOWER EXTREMITY ROM:   none painful  Active  Right eval Left eval  Hip flexion 100 100  Hip extension -5 -5  Hip abduction    Hip adduction    Hip internal rotation 20 20  Hip external rotation 35 30  Knee flexion    Knee extension    Ankle dorsiflexion    Ankle plantarflexion    Ankle inversion    Ankle eversion     (Blank rows = not tested)  LOWER EXTREMITY MMT:  none painful  MMT Right eval Left eval  Hip flexion 4+/5 4+/5  Hip extension    Hip abduction 4+/5 4+/5  Hip adduction 4+/5 4+/5  Hip internal rotation    Hip external rotation    Knee flexion    Knee extension 5/5 5/5  Ankle dorsiflexion 5/5  5/5  Ankle plantarflexion    Ankle inversion    Ankle eversion     (Blank rows = not tested)  LUMBAR SPECIAL TESTS:  Slump test: Negative, SI Compression/distraction test: Negative, and FABER test: Negative   GAIT: Distance walked: 62ft Assistive device utilized: None Level of assistance: Complete Independence Comments: decreased bilat step length, lack of hip rotation and hip extension  TODAY'S TREATMENT:                                                                                                                              DATE: 11/25   Exercises - Supine Lower Trunk Rotation  - 2 x daily - 7 x weekly - 2 sets - 10 reps - 2 hold - Supine Bridge  - 2 x daily - 7 x weekly - 2 sets - 10 reps - Seated Quadratus Lumborum Stretch in Chair  - 2 x daily - 7 x weekly - 1 sets - 3 reps - 30 hold - Jefferson Curl  - 2 x daily - 7 x weekly - 1 sets - 5 reps     PATIENT EDUCATION:  Education details:  MOI, diagnosis, prognosis, anatomy, exercise progression, DOMS expectations, muscle firing,  envelope of function, HEP, POC  Person educated: Patient Education method: Explanation, Demonstration, Tactile cues, Verbal cues,  Education comprehension: verbalized understanding, returned demonstration, and verbal cues required     HOME EXERCISE PROGRAM:    Access Code: [email or web address] URL: [email or web address] Date: 04/07/2023 Prepared by: Zebedee Iba             ASSESSMENT:   CLINICAL IMPRESSION:   Patient is a 77  y.o. male who was seen today for physical therapy evaluation and treatment for c/c of LBP. 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 Pt has significant joint stiffness and soft tissue restriction of the posterior aspect of bilat hips R>L. Pt is stiffness dominant with minimally sensitive or irritable pain during clinical testing. Pt has most difficulty with getting lumbar and hip motion. Plan to work on lumbopelvic mobility and generalized strengthening as  tolerated. Plan for joint mobs or soft tissue mobilization PRN with focus on a succinct list of exercise for pt to complete.  Pt would benefit from continued skilled therapy in order to reach goals and maximize functional lumbopelvic strength and ROM for return to normal mobility and ADL.    OBJECTIVE IMPAIRMENTS Abnormal gait, decreased activity tolerance, decreased endurance, decreased mobility, difficulty walking, decreased ROM, decreased strength, hypomobility, increased muscle spasms, impaired flexibility, improper body mechanics, postural dysfunction, and pain.    ACTIVITY LIMITATIONS carrying, lifting, bending, sitting, standing, squatting, sleeping, stairs, transfers, and locomotion level   PARTICIPATION LIMITATIONS: cleaning, laundry, interpersonal relationship, driving, shopping, community activity, occupation, and exercise   PERSONAL FACTORS Age, Fitness, Time since onset of injury/illness/exacerbation, and 1-2 comorbidities:  are also affecting patient's functional outcome.          GOALS:     SHORT TERM GOALS: Target date: 05/19/2023       Pt will become independent with HEP in order to demonstrate synthesis of  PT education.     Goal status: INITIAL   2.  Pt will report at least 2 pt reduction on NPRS scale for pain at worst in order to demonstrate functional improvement with household activity, self care, and ADL.    Goal status: INITIAL   3.  Pt will score at least 5 pt increase on FOTO to demonstrate functional improvement in MCII and pt perceived function.     Goal status: INITIAL     LONG TERM GOALS: Target date: 06/30/2023        Pt  will become independent with final HEP in order to demonstrate synthesis of PT education.    Goal status: INITIAL   2.  Pt will score >/= 69 on FOTO to demonstrate improvement in perceived lumbar function.      Goal status: INITIAL   3.  Pt will be able to demonstrate/report ability to sit/stand/sleep for extended periods of  time without pain in order to demonstrate functional improvement and tolerance to static positioning.    Goal status: INITIAL   4.  Pt will be able to demonstrate/report ability to bend, lift, mop without pain in order to demonstrate functional improvement and tolerance to exercise and community mobility.    Goal status: INITIAL     PLAN:   PLAN: PT FREQUENCY: 1-2x/week   PT DURATION: 12 weeks (plan to D/C in 8 wks)    PLANNED INTERVENTIONS: Therapeutic exercises, Therapeutic activity, Neuromuscular re-education, Balance training, Gait training, Patient/Family education, Joint manipulation, Joint mobilization, Stair training, Orthotic/Fit training, DME instructions, Aquatic Therapy, Dry Needling, Electrical stimulation, Spinal manipulation, Spinal mobilization, Cryotherapy, Moist heat, scar mobilization, Splintting, Taping, Vasopneumatic device, Traction, Ultrasound, Ionotophoresis 4mg /ml Dexamethasone, Manual therapy, and Re-evaluation.   PLAN FOR NEXT SESSION: R QL STM*, R lumbar UPA*; progressive hip and lumbar stretching, hip hinging motions, lumbar flexion and ext mobility  Zebedee Iba, PT 04/07/2023, 2:25 PM

## 2023-04-16 ENCOUNTER — Ambulatory Visit (HOSPITAL_BASED_OUTPATIENT_CLINIC_OR_DEPARTMENT_OTHER): Payer: Medicare HMO | Attending: Family

## 2023-04-16 ENCOUNTER — Encounter (HOSPITAL_BASED_OUTPATIENT_CLINIC_OR_DEPARTMENT_OTHER): Payer: Self-pay

## 2023-04-16 DIAGNOSIS — M6281 Muscle weakness (generalized): Secondary | ICD-10-CM | POA: Insufficient documentation

## 2023-04-16 DIAGNOSIS — M545 Low back pain, unspecified: Secondary | ICD-10-CM | POA: Insufficient documentation

## 2023-04-16 NOTE — Therapy (Signed)
OUTPATIENT PHYSICAL THERAPY THORACOLUMBAR EVALUATION   Patient Name: Christian [name] MRN: [id number] DOB:1945/09/07, 77 y.o., male Today's Date: 04/16/2023  END OF SESSION:  PT End of Session - 04/16/23 1418     Visit Number 2    Number of Visits 12    Date for PT Re-Evaluation 07/06/23    Authorization Type [employer]    PT Start Time 1320    PT Stop Time 1406    PT Time Calculation (min) 46 min    Activity Tolerance Patient tolerated treatment well    Behavior During Therapy [facility] for tasks assessed/performed              Past Medical History:  Diagnosis Date   Back pain 06/04/2015   DVT of lower limb, acute (HCC) 01/13/2015   High blood pressure    High cholesterol    History of chicken pox 01/13/2015   Hyperlipidemia, mild 01/13/2015   Hypokalemia 01/13/2015   Obesity    Verrucous skin lesion 02/08/2016   Past Surgical History:  Procedure Laterality Date   CARDIOVERSION N/A 09/30/2022   Procedure: CARDIOVERSION;  Surgeon: Laurey Morale, MD;  Location: [facility] INVASIVE CV LAB;  Service: Cardiovascular;  Laterality: N/A;   EYE SURGERY Bilateral 02/2019   Dr Vonna Kotyk   RIGHT HEART CATH N/A 12/04/2021   Procedure: RIGHT HEART CATH;  Surgeon: Laurey Morale, MD;  Location: [facility] INVASIVE CV LAB;  Service: Cardiovascular;  Laterality: N/A;   Patient Active Problem List   Diagnosis Date Noted   Seborrheic dermatitis 04/27/2021   Coronary artery disease involving native heart without angina pectoris 04/27/2021   Osteoarthritis of knee 04/27/2021   Chest pressure 04/23/2021   Right hip pain 03/04/2021   Cataract 11/20/2017   Right shoulder pain 05/20/2017   Diverticulosis 05/20/2017   Preventative health care 05/20/2017   Verrucous skin lesion 02/08/2016   Low back pain 06/04/2015   Nonallergic rhinitis 02/10/2015   Sun-damaged skin 01/29/2015   Benign essential HTN 01/29/2015   Hyperglycemia 01/29/2015   Hyperlipidemia, mild 01/13/2015   History of chicken pox 01/13/2015    Hypokalemia 01/13/2015   Obesity    Obstructive sleep apnea 06/10/2014    PCP: Bradd Canary, MD   REFERRING PROVIDER:  Sandford Craze, NP     REFERRING DIAG:  M54.50 (ICD-10-CM) - Right-sided low back pain without sciatica, unspecified chronicity      Rationale for Evaluation and Treatment: Rehabilitation  THERAPY DIAG:  Pain, lumbar region  Muscle weakness (generalized)  ONSET DATE: May 2025  SUBJECTIVE:  SUBJECTIVE STATEMENT: Pt reports improvement from IE and HEP. He notes improved mobility and decreased pain. Has been compliant with his HEP regularly.   IE: Pt states he is here today for R sided LBP. Pt denies NT into the legs. Pt does not have sudden weakness into the legs. Pt does have particular difficulty during activity but things like mopping or vacuuming with cause and create pain. 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 Pain has been ongoing for about 6 months. Pt reports maybe lifting something that caused the initial pain. Recently, he started having a sharp, stabbing pain into the R low back and right above the hip. Feels he is more cautious now due to pain. Pt has a gym membership but does not actively use it. Pt reports gradual loss of strength over the years which limits his activity around the home vs back pain limiting.   Pt has seen chiro without much relief.   Pt reports that no issues identified with cardiac testing.   PERTINENT HISTORY:  HLD, HTN, skin cancer, knee OA  PAIN:  Are you having pain? No: NPRS scale: 0/10; 3/10 at worst  Pain location: R sided LBP Pain description: stabbing Aggravating factors: rotation, sitting and then transition  Relieving factors: gabapentin, nothing else helps pain    PRECAUTIONS: None  RED FLAGS: None   WEIGHT BEARING RESTRICTIONS: No  FALLS:  Has patient fallen in last 6 months? No  LIVING ENVIRONMENT: Lives with: lives with their family and lives with their spouse Lives in: House/apartment Stairs: 3 steps to change floors  Has following equipment at home: None  OCCUPATION: retired  Recreation: walking, walking dogs   PLOF: Independent  PATIENT GOALS: reduce pain and return to normal activity  OBJECTIVE:  Note: Objective measures were completed at Evaluation unless otherwise noted.  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   PATIENT SURVEYS:  FOTO 65 69@ DC 5 pts MCII  SCREENING FOR RED FLAGS: Bowel or bladder incontinence: No Spinal tumors: No Cauda equina syndrome: No Compression fracture: No Abdominal aneurysm: No  COGNITION: Overall cognitive status: Within functional limits for tasks assessed     SENSATION: WFL  MUSCLE LENGTH: Hamstrings: positive supine 90/90 Positive Ely's  POSTURE: rounded shoulders and increased thoracic kyphosis  PALPATION: TTP and significant hypertoncity of bilat R>L lumbar paraspinals, QL, glutes and hip rotators  LUMBAR ROM:   AROM eval  Flexion 50%  Extension 25%  Right lateral flexion 75%  Left lateral flexion 75%  Right rotation 75%  Left rotation 75%   (Blank rows = not tested)  LOWER EXTREMITY ROM:   none painful  Active  Right eval Left eval  Hip flexion 100 100  Hip extension -5 -5  Hip abduction    Hip adduction    Hip internal rotation 20 20  Hip external rotation 35 30  Knee flexion    Knee extension    Ankle dorsiflexion    Ankle plantarflexion    Ankle inversion    Ankle eversion     (Blank rows = not tested)  LOWER EXTREMITY MMT:  none painful  MMT Right eval Left eval  Hip flexion 4+/5 4+/5  Hip extension    Hip abduction 4+/5 4+/5  Hip adduction 4+/5 4+/5  Hip internal rotation    Hip external rotation     Knee flexion    Knee extension 5/5 5/5  Ankle dorsiflexion 5/5  5/5  Ankle plantarflexion    Ankle inversion    Ankle eversion     (Blank rows = not tested)  LUMBAR SPECIAL TESTS:  Slump test: Negative, SI Compression/distraction test: Negative, and FABER test: Negative   GAIT: Distance walked: 30ft Assistive device utilized: None Level of assistance: Complete Independence Comments: decreased bilat step length, lack of hip rotation and hip extension  TODAY'S TREATMENT:                                                                                                                              DATE:   12/4 Nu-step L4 x55min LTR 5" x10ea Bridge 3-second hold x 10 Piriformis stretch 22nd hold 2 x 10 each HSS seated 20seconds x2ea Seated QL stretch 10seconds x3ea side Seated lumbar flexion stretch (reach to floor) 5" x5 Supine march with TAC 2x10ea PPT 3" x10 HEP update and review    11/25   Exercises - Supine Lower Trunk Rotation  - 2 x daily - 7 x weekly - 2 sets - 10 reps - 2 hold - Supine Bridge  - 2 x daily - 7 x weekly - 2 sets - 10 reps - Seated Quadratus Lumborum Stretch in Chair  - 2 x daily - 7 x weekly - 1 sets - 3 reps - 30 hold - Jefferson Curl  - 2 x daily - 7 x weekly - 1 sets - 5 reps     PATIENT EDUCATION:  Education details:  MOI, diagnosis, prognosis, anatomy, exercise progression, DOMS expectations, muscle firing,  envelope of function, HEP, POC  Person educated: Patient Education method: Explanation, Demonstration, Tactile cues, Verbal cues,  Education comprehension: verbalized understanding, returned demonstration, and verbal cues required     HOME EXERCISE PROGRAM:    Access Code: [email or web address] URL: [email or web address] Date: 04/07/2023 Prepared by: Zebedee Iba             ASSESSMENT:   CLINICAL IMPRESSION:   Good tolerance for additional stretches for lateral lumbar and hip musculature.  Reviewed technique with bridges as  pt reports this exercise is difficult. He was instructed to hold for only 2-3 seconds.  Updated HEP to include single knee-to-chest stretch and supine marching with TAC.  Educated patient on proper postural alignment in standing and seated positions and how to improve postural awareness throughout the day.   Eval: Patient is a 77  y.o. male who was seen today for physical therapy evaluation and treatment for c/c of LBP. 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 Pt has significant joint stiffness and soft tissue restriction of the posterior aspect of bilat hips R>L. Pt is stiffness dominant with minimally sensitive or irritable pain during clinical testing. Pt has most difficulty with getting lumbar and hip motion. Plan to work on lumbopelvic mobility and generalized strengthening as tolerated. Plan for joint mobs or soft tissue mobilization PRN with focus on a succinct list of exercise for pt to complete.  Pt  would benefit from continued skilled therapy in order to reach goals and maximize functional lumbopelvic strength and ROM for return to normal mobility and ADL.    OBJECTIVE IMPAIRMENTS Abnormal gait, decreased activity tolerance, decreased endurance, decreased mobility, difficulty walking, decreased ROM, decreased strength, hypomobility, increased muscle spasms, impaired flexibility, improper body mechanics, postural dysfunction, and pain.    ACTIVITY LIMITATIONS carrying, lifting, bending, sitting, standing, squatting, sleeping, stairs, transfers, and locomotion level   PARTICIPATION LIMITATIONS: cleaning, laundry, interpersonal relationship, driving, shopping, community activity, occupation, and exercise   PERSONAL FACTORS Age, Fitness, Time since onset of injury/illness/exacerbation, and 1-2 comorbidities:  are also affecting patient's functional outcome.          GOALS:     SHORT TERM GOALS: Target date: 05/19/2023       Pt will become independent with HEP in order to  demonstrate synthesis of PT education.     Goal status: INITIAL   2.  Pt will report at least 2 pt reduction on NPRS scale for pain at worst in order to demonstrate functional improvement with household activity, self care, and ADL.    Goal status: INITIAL   3.  Pt will score at least 5 pt increase on FOTO to demonstrate functional improvement in MCII and pt perceived function.     Goal status: INITIAL     LONG TERM GOALS: Target date: 06/30/2023        Pt  will become independent with final HEP in order to demonstrate synthesis of PT education.    Goal status: INITIAL   2.  Pt will score >/= 69 on FOTO to demonstrate improvement in perceived lumbar function.      Goal status: INITIAL   3.  Pt will be able to demonstrate/report ability to sit/stand/sleep for extended periods of time without pain in order to demonstrate functional improvement and tolerance to static positioning.    Goal status: INITIAL   4.  Pt will be able to demonstrate/report ability to bend, lift, mop without pain in order to demonstrate functional improvement and tolerance to exercise and community mobility.    Goal status: INITIAL     PLAN:   PLAN: PT FREQUENCY: 1-2x/week   PT DURATION: 12 weeks (plan to D/C in 8 wks)    PLANNED INTERVENTIONS: Therapeutic exercises, Therapeutic activity, Neuromuscular re-education, Balance training, Gait training, Patient/Family education, Joint manipulation, Joint mobilization, Stair training, Orthotic/Fit training, DME instructions, Aquatic Therapy, Dry Needling, Electrical stimulation, Spinal manipulation, Spinal mobilization, Cryotherapy, Moist heat, scar mobilization, Splintting, Taping, Vasopneumatic device, Traction, Ultrasound, Ionotophoresis 4mg /ml Dexamethasone, Manual therapy, and Re-evaluation.   PLAN FOR NEXT SESSION: R QL STM*, R lumbar UPA*; progressive hip and lumbar stretching, hip hinging motions, lumbar flexion and ext mobility  Donnel Saxon Valeda Corzine,  PTA 04/16/2023, 2:19 PM

## 2023-04-21 ENCOUNTER — Encounter (HOSPITAL_BASED_OUTPATIENT_CLINIC_OR_DEPARTMENT_OTHER): Payer: Self-pay | Admitting: Physical Therapy

## 2023-04-21 ENCOUNTER — Ambulatory Visit (HOSPITAL_BASED_OUTPATIENT_CLINIC_OR_DEPARTMENT_OTHER): Payer: Medicare HMO | Admitting: Physical Therapy

## 2023-04-21 DIAGNOSIS — M545 Low back pain, unspecified: Secondary | ICD-10-CM | POA: Diagnosis not present

## 2023-04-21 DIAGNOSIS — M6281 Muscle weakness (generalized): Secondary | ICD-10-CM | POA: Diagnosis not present

## 2023-04-21 NOTE — Therapy (Signed)
OUTPATIENT PHYSICAL THERAPY THORACOLUMBAR EVALUATION   Patient Name: Christian [name] MRN: [id number] DOB:August 12, 1945, 77 y.o., male Today's Date: 04/21/2023  END OF SESSION:  PT End of Session - 04/21/23 1441     Visit Number 3    Number of Visits 12    Date for PT Re-Evaluation 07/06/23    Authorization Type [employer]et[employer]    PT Start Time 1400    PT Stop Time 1443    PT Time Calculation (min) 43 min    Activity Tolerance Patient tolerated treatment well    Behavior During Therapy [facility] for tasks assessed/performed               Past Medical History:  Diagnosis Date   Back pain 06/04/2015   DVT of lower limb, acute (HCC) 01/13/2015   High blood pressure    High cholesterol    History of chicken pox 01/13/2015   Hyperlipidemia, mild 01/13/2015   Hypokalemia 01/13/2015   Obesity    Verrucous skin lesion 02/08/2016   Past Surgical History:  Procedure Laterality Date   CARDIOVERSION N/A 09/30/2022   Procedure: CARDIOVERSION;  Surgeon: Laurey Morale, MD;  Location: [facility] INVASIVE CV LAB;  Service: Cardiovascular;  Laterality: N/A;   EYE SURGERY Bilateral 02/2019   Dr Vonna Kotyk   RIGHT HEART CATH N/A 12/04/2021   Procedure: RIGHT HEART CATH;  Surgeon: Laurey Morale, MD;  Location: [facility] INVASIVE CV LAB;  Service: Cardiovascular;  Laterality: N/A;   Patient Active Problem List   Diagnosis Date Noted   Seborrheic dermatitis 04/27/2021   Coronary artery disease involving native heart without angina pectoris 04/27/2021   Osteoarthritis of knee 04/27/2021   Chest pressure 04/23/2021   Right hip pain 03/04/2021   Cataract 11/20/2017   Right shoulder pain 05/20/2017   Diverticulosis 05/20/2017   Preventative health care 05/20/2017   Verrucous skin lesion 02/08/2016   Low back pain 06/04/2015   Nonallergic rhinitis 02/10/2015   Sun-damaged skin 01/29/2015   Benign essential HTN 01/29/2015   Hyperglycemia 01/29/2015   Hyperlipidemia, mild 01/13/2015   History of chicken pox 01/13/2015    Hypokalemia 01/13/2015   Obesity    Obstructive sleep apnea 06/10/2014    PCP: Bradd Canary, MD   REFERRING PROVIDER:  Sandford Craze, NP     REFERRING DIAG:  M54.50 (ICD-10-CM) - Right-sided low back pain without sciatica, unspecified chronicity      Rationale for Evaluation and Treatment: Rehabilitation  THERAPY DIAG:  Pain, lumbar region  Muscle weakness (generalized)  ONSET DATE: May 2025  SUBJECTIVE:  SUBJECTIVE STATEMENT: Pt states the exercises seems to be helping. He feels stiff today. Laying down and twisting will cause discomfort. "I don't hardly feeling the pain anymore right now."    IE: Pt states he is here today for R sided LBP. Pt denies NT into the legs. Pt does not have sudden weakness into the legs. Pt does have particular difficulty during activity but things like mopping or vacuuming with cause and create pain. 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 Pain has been ongoing for about 6 months. Pt reports maybe lifting something that caused the initial pain. Recently, he started having a sharp, stabbing pain into the R low back and right above the hip. Feels he is more cautious now due to pain. Pt has a gym membership but does not actively use it. Pt reports gradual loss of strength over the years which limits his activity around the home vs back pain limiting.   Pt has seen chiro without much relief.   Pt reports that no issues identified with cardiac testing.   PERTINENT HISTORY:  HLD, HTN, skin cancer, knee OA  PAIN:  Are you having pain? No: NPRS scale: 0/10; 3/10 at worst  Pain location: R sided LBP Pain description: stabbing Aggravating factors: rotation, sitting and then transition  Relieving factors:  gabapentin, nothing else helps pain   PRECAUTIONS: None  RED FLAGS: None   WEIGHT BEARING RESTRICTIONS: No  FALLS:  Has patient fallen in last 6 months? No  LIVING ENVIRONMENT: Lives with: lives with their family and lives with their spouse Lives in: House/apartment Stairs: 3 steps to change floors  Has following equipment at home: None  OCCUPATION: retired  Recreation: walking, walking dogs   PLOF: Independent  PATIENT GOALS: reduce pain and return to normal activity  OBJECTIVE:  Note: Objective measures were completed at Evaluation unless otherwise noted.  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   PATIENT SURVEYS:  FOTO 65 69@ DC 5 pts MCII  SCREENING FOR RED FLAGS: Bowel or bladder incontinence: No Spinal tumors: No Cauda equina syndrome: No Compression fracture: No Abdominal aneurysm: No  COGNITION: Overall cognitive status: Within functional limits for tasks assessed     SENSATION: WFL  MUSCLE LENGTH: Hamstrings: positive supine 90/90 Positive Ely's  POSTURE: rounded shoulders and increased thoracic kyphosis  PALPATION: TTP and significant hypertoncity of bilat R>L lumbar paraspinals, QL, glutes and hip rotators  LUMBAR ROM:   AROM eval  Flexion 50%  Extension 25%  Right lateral flexion 75%  Left lateral flexion 75%  Right rotation 75%  Left rotation 75%   (Blank rows = not tested)  LOWER EXTREMITY ROM:   none painful  Active  Right eval Left eval  Hip flexion 100 100  Hip extension -5 -5  Hip abduction    Hip adduction    Hip internal rotation 20 20  Hip external rotation 35 30  Knee flexion    Knee extension    Ankle dorsiflexion    Ankle plantarflexion    Ankle inversion    Ankle eversion     (Blank rows = not tested)  LOWER EXTREMITY MMT:  none painful  MMT Right eval Left eval  Hip flexion 4+/5 4+/5  Hip extension    Hip abduction 4+/5 4+/5  Hip adduction 4+/5 4+/5  Hip internal  rotation    Hip external rotation    Knee flexion  Knee extension 5/5 5/5  Ankle dorsiflexion 5/5 5/5  Ankle plantarflexion    Ankle inversion    Ankle eversion     (Blank rows = not tested)  LUMBAR SPECIAL TESTS:  Slump test: Negative, SI Compression/distraction test: Negative, and FABER test: Negative   GAIT: Distance walked: 65ft Assistive device utilized: None Level of assistance: Complete Independence Comments: decreased bilat step length, lack of hip rotation and hip extension  TODAY'S TREATMENT:                                                                                                                              DATE:   12/9  L1-5 UPA and CPA grade III-IV STM R lumbar paraspinals and QL STM R glutes and hip rotators  Seated piriformis stretch 30s 3x  STS from mid height table 3x8 Standing wall hip ext 2x15 Seated flexion with stool 3s 10x ea with SB L and R  12/4 Nu-step L4 x29min LTR 5" x10ea Bridge 3-second hold x 10 Piriformis stretch 22nd hold 2 x 10 each HSS seated 20seconds x2ea Seated QL stretch 10seconds x3ea side Seated lumbar flexion stretch (reach to floor) 5" x5 Supine march with TAC 2x10ea PPT 3" x10 HEP update and review    11/25   Exercises - Supine Lower Trunk Rotation  - 2 x daily - 7 x weekly - 2 sets - 10 reps - 2 hold - Supine Bridge  - 2 x daily - 7 x weekly - 2 sets - 10 reps - Seated Quadratus Lumborum Stretch in Chair  - 2 x daily - 7 x weekly - 1 sets - 3 reps - 30 hold - Jefferson Curl  - 2 x daily - 7 x weekly - 1 sets - 5 reps     PATIENT EDUCATION:  Education details:  MOI, diagnosis, prognosis, anatomy, exercise progression, DOMS expectations, muscle firing,  envelope of function, HEP, POC  Person educated: Patient Education method: Explanation, Demonstration, Tactile cues, Verbal cues,  Education comprehension: verbalized understanding, returned demonstration, and verbal cues required     HOME EXERCISE  PROGRAM:    Access Code: [email or web address] URL: [email or web address] Date: 04/07/2023 Prepared by: Zebedee Iba             ASSESSMENT:   CLINICAL IMPRESSION:   Pt session focused on progression of lumbar mobility exercise and progression of hip hinging motions to standing. Pt did find improvement in stiffness following manual therapy and exercise. However, lumbar ext at the wall does increase feeling of stiffness on the R side. Pt advised to reduce range of motion to mitigate discomfort and closing pattern restriction. Plan to continue with lumbopelvic mobility as tolerated and use manual therapy PRN for pain/stiffness management. Pt progressing well with therapy exercises and will likely be able to progress to more strengthening and gym based movements at future sessions. Pt would benefit from continued skilled therapy in order to reach goals and maximize functional lumbopelvic  strength and ROM for return to normal mobility and ADL.    Eval: Patient is a 77  y.o. male who was seen today for physical therapy evaluation and treatment for c/c of LBP. 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 Pt has significant joint stiffness and soft tissue restriction of the posterior aspect of bilat hips R>L. Pt is stiffness dominant with minimally sensitive or irritable pain during clinical testing. Pt has most difficulty with getting lumbar and hip motion. Plan to work on lumbopelvic mobility and generalized strengthening as tolerated. Plan for joint mobs or soft tissue mobilization PRN with focus on a succinct list of exercise for pt to complete.  Pt would benefit from continued skilled therapy in order to reach goals and maximize functional lumbopelvic strength and ROM for return to normal mobility and ADL.    OBJECTIVE IMPAIRMENTS Abnormal gait, decreased activity tolerance, decreased endurance, decreased mobility, difficulty walking, decreased ROM, decreased strength, hypomobility, increased  muscle spasms, impaired flexibility, improper body mechanics, postural dysfunction, and pain.    ACTIVITY LIMITATIONS carrying, lifting, bending, sitting, standing, squatting, sleeping, stairs, transfers, and locomotion level   PARTICIPATION LIMITATIONS: cleaning, laundry, interpersonal relationship, driving, shopping, community activity, occupation, and exercise   PERSONAL FACTORS Age, Fitness, Time since onset of injury/illness/exacerbation, and 1-2 comorbidities:  are also affecting patient's functional outcome.          GOALS:     SHORT TERM GOALS: Target date: 05/19/2023       Pt will become independent with HEP in order to demonstrate synthesis of PT education.     Goal status: INITIAL   2.  Pt will report at least 2 pt reduction on NPRS scale for pain at worst in order to demonstrate functional improvement with household activity, self care, and ADL.    Goal status: INITIAL   3.  Pt will score at least 5 pt increase on FOTO to demonstrate functional improvement in MCII and pt perceived function.     Goal status: INITIAL     LONG TERM GOALS: Target date: 06/30/2023        Pt  will become independent with final HEP in order to demonstrate synthesis of PT education.    Goal status: INITIAL   2.  Pt will score >/= 69 on FOTO to demonstrate improvement in perceived lumbar function.      Goal status: INITIAL   3.  Pt will be able to demonstrate/report ability to sit/stand/sleep for extended periods of time without pain in order to demonstrate functional improvement and tolerance to static positioning.    Goal status: INITIAL   4.  Pt will be able to demonstrate/report ability to bend, lift, mop without pain in order to demonstrate functional improvement and tolerance to exercise and community mobility.    Goal status: INITIAL     PLAN:   PLAN: PT FREQUENCY: 1-2x/week   PT DURATION: 12 weeks (plan to D/C in 8 wks)    PLANNED INTERVENTIONS: Therapeutic  exercises, Therapeutic activity, Neuromuscular re-education, Balance training, Gait training, Patient/Family education, Joint manipulation, Joint mobilization, Stair training, Orthotic/Fit training, DME instructions, Aquatic Therapy, Dry Needling, Electrical stimulation, Spinal manipulation, Spinal mobilization, Cryotherapy, Moist heat, scar mobilization, Splintting, Taping, Vasopneumatic device, Traction, Ultrasound, Ionotophoresis 4mg /ml Dexamethasone, Manual therapy, and Re-evaluation.   PLAN FOR NEXT SESSION: R QL STM*, R lumbar UPA*; progressive hip and lumbar stretching, hip hinging motions, lumbar flexion and ext mobility  Zebedee Iba, PT 04/21/2023, 2:48 PM

## 2023-04-28 ENCOUNTER — Other Ambulatory Visit (HOSPITAL_COMMUNITY): Payer: Medicare HMO

## 2023-04-29 ENCOUNTER — Ambulatory Visit (HOSPITAL_BASED_OUTPATIENT_CLINIC_OR_DEPARTMENT_OTHER): Payer: Medicare HMO | Admitting: Physical Therapy

## 2023-04-29 ENCOUNTER — Encounter (HOSPITAL_BASED_OUTPATIENT_CLINIC_OR_DEPARTMENT_OTHER): Payer: Self-pay | Admitting: Physical Therapy

## 2023-04-29 DIAGNOSIS — M6281 Muscle weakness (generalized): Secondary | ICD-10-CM

## 2023-04-29 DIAGNOSIS — M545 Low back pain, unspecified: Secondary | ICD-10-CM

## 2023-04-29 NOTE — Therapy (Signed)
OUTPATIENT PHYSICAL THERAPY THORACOLUMBAR EVALUATION   Patient Name: Christian [name] MRN: [id number] DOB:06-24-1945, 77 y.o., male Today's Date: 04/29/2023  END OF SESSION:  PT End of Session - 04/29/23 1141     Visit Number 4    Number of Visits 12    Date for PT Re-Evaluation 07/06/23    Authorization Type [employer]    PT Start Time 1145    PT Stop Time 1217    PT Time Calculation (min) 32 min    Activity Tolerance Patient tolerated treatment well    Behavior During Therapy [facility] for tasks assessed/performed               Past Medical History:  Diagnosis Date   Back pain 06/04/2015   DVT of lower limb, acute (HCC) 01/13/2015   High blood pressure    High cholesterol    History of chicken pox 01/13/2015   Hyperlipidemia, mild 01/13/2015   Hypokalemia 01/13/2015   Obesity    Verrucous skin lesion 02/08/2016   Past Surgical History:  Procedure Laterality Date   CARDIOVERSION N/A 09/30/2022   Procedure: CARDIOVERSION;  Surgeon: Laurey Morale, MD;  Location: [facility] INVASIVE CV LAB;  Service: Cardiovascular;  Laterality: N/A;   EYE SURGERY Bilateral 02/2019   Dr Vonna Kotyk   RIGHT HEART CATH N/A 12/04/2021   Procedure: RIGHT HEART CATH;  Surgeon: Laurey Morale, MD;  Location: [facility] INVASIVE CV LAB;  Service: Cardiovascular;  Laterality: N/A;   Patient Active Problem List   Diagnosis Date Noted   Seborrheic dermatitis 04/27/2021   Coronary artery disease involving native heart without angina pectoris 04/27/2021   Osteoarthritis of knee 04/27/2021   Chest pressure 04/23/2021   Right hip pain 03/04/2021   Cataract 11/20/2017   Right shoulder pain 05/20/2017   Diverticulosis 05/20/2017   Preventative health care 05/20/2017   Verrucous skin lesion 02/08/2016   Low back pain 06/04/2015   Nonallergic rhinitis 02/10/2015   Sun-damaged skin 01/29/2015   Benign essential HTN 01/29/2015   Hyperglycemia 01/29/2015   Hyperlipidemia, mild 01/13/2015   History of chicken pox 01/13/2015    Hypokalemia 01/13/2015   Obesity    Obstructive sleep apnea 06/10/2014    PCP: Bradd Canary, MD   REFERRING PROVIDER:  Sandford Craze, NP     REFERRING DIAG:  M54.50 (ICD-10-CM) - Right-sided low back pain without sciatica, unspecified chronicity      Rationale for Evaluation and Treatment: Rehabilitation  THERAPY DIAG:  Pain, lumbar region  Muscle weakness (generalized)  ONSET DATE: May 2025  SUBJECTIVE:  SUBJECTIVE STATEMENT: Pt states he has had no pain since last session. The stiffness is still present in the morning and is gone within a few steps.   IE: Pt states he is here today for R sided LBP. Pt denies NT into the legs. Pt does not have sudden weakness into the legs. Pt does have particular difficulty during activity but things like mopping or vacuuming with cause and create pain. 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 Pain has been ongoing for about 6 months. Pt reports maybe lifting something that caused the initial pain. Recently, he started having a sharp, stabbing pain into the R low back and right above the hip. Feels he is more cautious now due to pain. Pt has a gym membership but does not actively use it. Pt reports gradual loss of strength over the years which limits his activity around the home vs back pain limiting.   Pt has seen chiro without much relief.   Pt reports that no issues identified with cardiac testing.   PERTINENT HISTORY:  HLD, HTN, skin cancer, knee OA  PAIN:  Are you having pain? No: NPRS scale: 0/10; 3/10 at worst  Pain location: R sided LBP Pain description: stabbing Aggravating factors: rotation, sitting and then transition  Relieving factors: gabapentin, nothing else helps pain    PRECAUTIONS: None  RED FLAGS: None   WEIGHT BEARING RESTRICTIONS: No  FALLS:  Has patient fallen in last 6 months? No  LIVING ENVIRONMENT: Lives with: lives with their family and lives with their spouse Lives in: House/apartment Stairs: 3 steps to change floors  Has following equipment at home: None  OCCUPATION: retired  Recreation: walking, walking dogs   PLOF: Independent  PATIENT GOALS: reduce pain and return to normal activity  OBJECTIVE:  Note: Objective measures were completed at Evaluation unless otherwise noted.  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   PATIENT SURVEYS:  FOTO 65 69@ DC 5 pts MCII  SCREENING FOR RED FLAGS: Bowel or bladder incontinence: No Spinal tumors: No Cauda equina syndrome: No Compression fracture: No Abdominal aneurysm: No  COGNITION: Overall cognitive status: Within functional limits for tasks assessed     SENSATION: WFL  MUSCLE LENGTH: Hamstrings: positive supine 90/90 Positive Ely's  POSTURE: rounded shoulders and increased thoracic kyphosis  PALPATION: TTP and significant hypertoncity of bilat R>L lumbar paraspinals, QL, glutes and hip rotators  LUMBAR ROM:   AROM eval  Flexion 50%  Extension 25%  Right lateral flexion 75%  Left lateral flexion 75%  Right rotation 75%  Left rotation 75%   (Blank rows = not tested)  LOWER EXTREMITY ROM:   none painful  Active  Right eval Left eval  Hip flexion 100 100  Hip extension -5 -5  Hip abduction    Hip adduction    Hip internal rotation 20 20  Hip external rotation 35 30  Knee flexion    Knee extension    Ankle dorsiflexion    Ankle plantarflexion    Ankle inversion    Ankle eversion     (Blank rows = not tested)  LOWER EXTREMITY MMT:  none painful  MMT Right eval Left eval  Hip flexion 4+/5 4+/5  Hip extension    Hip abduction 4+/5 4+/5  Hip adduction 4+/5 4+/5  Hip internal rotation    Hip external rotation     Knee flexion    Knee extension 5/5 5/5  Ankle dorsiflexion 5/5 5/5  Ankle plantarflexion    Ankle inversion    Ankle eversion     (Blank rows = not tested)  LUMBAR SPECIAL TESTS:  Slump test: Negative, SI Compression/distraction test: Negative, and FABER test: Negative   GAIT: Distance walked: 71ft Assistive device utilized: None Level of assistance: Complete Independence Comments: decreased bilat step length, lack of hip rotation and hip extension  TODAY'S TREATMENT:                                                                                                                              DATE:   12/17  Seated piriformis stretch 30s 3x  Figure 4 rotation LTR 20x LTR 10x Bridge 3x10 Seated piriformis stretch 30s 2x each Standing hip flexor stretch 30s 3x 5lb jefferson curl 3x5 5lb weighted SB 2x10 each  12/9  L1-5 UPA and CPA grade III-IV STM R lumbar paraspinals and QL STM R glutes and hip rotators  Seated piriformis stretch 30s 3x  STS from mid height table 3x8 Standing wall hip ext 2x15 Seated flexion with stool 3s 10x ea with SB L and R  12/4 Nu-step L4 x41min LTR 5" x10ea Bridge 3-second hold x 10 Piriformis stretch 22nd hold 2 x 10 each HSS seated 20seconds x2ea Seated QL stretch 10seconds x3ea side Seated lumbar flexion stretch (reach to floor) 5" x5 Supine march with TAC 2x10ea PPT 3" x10 HEP update and review    11/25   Exercises - Supine Lower Trunk Rotation  - 2 x daily - 7 x weekly - 2 sets - 10 reps - 2 hold - Supine Bridge  - 2 x daily - 7 x weekly - 2 sets - 10 reps - Seated Quadratus Lumborum Stretch in Chair  - 2 x daily - 7 x weekly - 1 sets - 3 reps - 30 hold - Jefferson Curl  - 2 x daily - 7 x weekly - 1 sets - 5 reps     PATIENT EDUCATION:  Education details:  MOI, diagnosis, prognosis, anatomy, exercise progression, DOMS expectations, muscle firing,  envelope of function, HEP, POC  Person educated: Patient Education  method: Explanation, Demonstration, Tactile cues, Verbal cues,  Education comprehension: verbalized understanding, returned demonstration, and verbal cues required     HOME EXERCISE PROGRAM:    Access Code: [email or web address] URL: [email or web address] Date: 04/07/2023 Prepared by: Zebedee Iba             ASSESSMENT:   CLINICAL IMPRESSION:   Pt with improvement in lumbar stiffness and no pain into the low back since last session. Pt session focused on progressing lumbar mobility exercise and progressively loading lumbar region. HEP updated accordingly. Pt tolerated treatment well with irritation to the lumbar region. Plan to reduce frequency of visits moving forward. Consider working bracing technique at next as pt does tend to "bulge" vs brace with visible diastasis recti noted with supine to sit tranfers. Pt would benefit from  continued skilled therapy in order to reach goals and maximize functional lumbopelvic strength and ROM for return to normal mobility and ADL.    Eval: Patient is a 77  y.o. male who was seen today for physical therapy evaluation and treatment for c/c of LBP. 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 Pt has significant joint stiffness and soft tissue restriction of the posterior aspect of bilat hips R>L. Pt is stiffness dominant with minimally sensitive or irritable pain during clinical testing. Pt has most difficulty with getting lumbar and hip motion. Plan to work on lumbopelvic mobility and generalized strengthening as tolerated. Plan for joint mobs or soft tissue mobilization PRN with focus on a succinct list of exercise for pt to complete.  Pt would benefit from continued skilled therapy in order to reach goals and maximize functional lumbopelvic strength and ROM for return to normal mobility and ADL.    OBJECTIVE IMPAIRMENTS Abnormal gait, decreased activity tolerance, decreased endurance, decreased mobility, difficulty walking, decreased ROM, decreased  strength, hypomobility, increased muscle spasms, impaired flexibility, improper body mechanics, postural dysfunction, and pain.    ACTIVITY LIMITATIONS carrying, lifting, bending, sitting, standing, squatting, sleeping, stairs, transfers, and locomotion level   PARTICIPATION LIMITATIONS: cleaning, laundry, interpersonal relationship, driving, shopping, community activity, occupation, and exercise   PERSONAL FACTORS Age, Fitness, Time since onset of injury/illness/exacerbation, and 1-2 comorbidities:  are also affecting patient's functional outcome.          GOALS:     SHORT TERM GOALS: Target date: 05/19/2023       Pt will become independent with HEP in order to demonstrate synthesis of PT education.     Goal status: INITIAL   2.  Pt will report at least 2 pt reduction on NPRS scale for pain at worst in order to demonstrate functional improvement with household activity, self care, and ADL.    Goal status: INITIAL   3.  Pt will score at least 5 pt increase on FOTO to demonstrate functional improvement in MCII and pt perceived function.     Goal status: INITIAL     LONG TERM GOALS: Target date: 06/30/2023        Pt  will become independent with final HEP in order to demonstrate synthesis of PT education.    Goal status: INITIAL   2.  Pt will score >/= 69 on FOTO to demonstrate improvement in perceived lumbar function.      Goal status: INITIAL   3.  Pt will be able to demonstrate/report ability to sit/stand/sleep for extended periods of time without pain in order to demonstrate functional improvement and tolerance to static positioning.    Goal status: INITIAL   4.  Pt will be able to demonstrate/report ability to bend, lift, mop without pain in order to demonstrate functional improvement and tolerance to exercise and community mobility.    Goal status: INITIAL     PLAN:   PLAN: PT FREQUENCY: 1-2x/week   PT DURATION: 12 weeks (plan to D/C in 8 wks)    PLANNED  INTERVENTIONS: Therapeutic exercises, Therapeutic activity, Neuromuscular re-education, Balance training, Gait training, Patient/Family education, Joint manipulation, Joint mobilization, Stair training, Orthotic/Fit training, DME instructions, Aquatic Therapy, Dry Needling, Electrical stimulation, Spinal manipulation, Spinal mobilization, Cryotherapy, Moist heat, scar mobilization, Splintting, Taping, Vasopneumatic device, Traction, Ultrasound, Ionotophoresis 4mg /ml Dexamethasone, Manual therapy, and Re-evaluation.   PLAN FOR NEXT SESSION: R QL STM*, R lumbar UPA*; progressive hip and lumbar stretching, hip hinging motions, lumbar flexion and ext mobility  Hessie Diener  Vilma Will, PT 04/29/2023, 12:36 PM

## 2023-05-06 ENCOUNTER — Encounter (HOSPITAL_BASED_OUTPATIENT_CLINIC_OR_DEPARTMENT_OTHER): Payer: Medicare HMO | Admitting: Physical Therapy

## 2023-05-13 ENCOUNTER — Ambulatory Visit (HOSPITAL_BASED_OUTPATIENT_CLINIC_OR_DEPARTMENT_OTHER): Payer: Medicare HMO | Admitting: Physical Therapy

## 2023-05-13 ENCOUNTER — Encounter (HOSPITAL_BASED_OUTPATIENT_CLINIC_OR_DEPARTMENT_OTHER): Payer: Self-pay | Admitting: Physical Therapy

## 2023-05-13 DIAGNOSIS — M6281 Muscle weakness (generalized): Secondary | ICD-10-CM | POA: Diagnosis not present

## 2023-05-13 DIAGNOSIS — M545 Low back pain, unspecified: Secondary | ICD-10-CM

## 2023-05-13 NOTE — Therapy (Signed)
 OUTPATIENT PHYSICAL THERAPY THORACOLUMBAR EVALUATION   Patient Name: Christian [name] MRN: [id number] DOB:1945-11-14, 77 y.o., male Today's Date: 05/13/2023  END OF SESSION:  PT End of Session - 05/13/23 1101     Visit Number 5    Number of Visits 12    Date for PT Re-Evaluation 07/06/23    Authorization Type [employer]    PT Start Time 1100    PT Stop Time 1140    PT Time Calculation (min) 40 min    Activity Tolerance Patient tolerated treatment well    Behavior During Therapy [facility] for tasks assessed/performed               Past Medical History:  Diagnosis Date   Back pain 06/04/2015   DVT of lower limb, acute (HCC) 01/13/2015   High blood pressure    High cholesterol    History of chicken pox 01/13/2015   Hyperlipidemia, mild 01/13/2015   Hypokalemia 01/13/2015   Obesity    Verrucous skin lesion 02/08/2016   Past Surgical History:  Procedure Laterality Date   CARDIOVERSION N/A 09/30/2022   Procedure: CARDIOVERSION;  Surgeon: Rolan Ezra RAMAN, MD;  Location: [facility] INVASIVE CV LAB;  Service: Cardiovascular;  Laterality: N/A;   EYE SURGERY Bilateral 02/2019   Dr Lavonia   RIGHT HEART CATH N/A 12/04/2021   Procedure: RIGHT HEART CATH;  Surgeon: Rolan Ezra RAMAN, MD;  Location: [facility] INVASIVE CV LAB;  Service: Cardiovascular;  Laterality: N/A;   Patient Active Problem List   Diagnosis Date Noted   Seborrheic dermatitis 04/27/2021   Coronary artery disease involving native heart without angina pectoris 04/27/2021   Osteoarthritis of knee 04/27/2021   Chest pressure 04/23/2021   Right hip pain 03/04/2021   Cataract 11/20/2017   Right shoulder pain 05/20/2017   Diverticulosis 05/20/2017   Preventative health care 05/20/2017   Verrucous skin lesion 02/08/2016   Low back pain 06/04/2015   Nonallergic rhinitis 02/10/2015   Sun-damaged skin 01/29/2015   Benign essential HTN 01/29/2015   Hyperglycemia 01/29/2015   Hyperlipidemia, mild 01/13/2015   History of chicken pox 01/13/2015    Hypokalemia 01/13/2015   Obesity    Obstructive sleep apnea 06/10/2014    PCP: Domenica Harlene DELENA, MD   REFERRING PROVIDER:  Daryl Setter, NP     REFERRING DIAG:  M54.50 (ICD-10-CM) - Right-sided low back pain without sciatica, unspecified chronicity      Rationale for Evaluation and Treatment: Rehabilitation  THERAPY DIAG:  Pain, lumbar region  Muscle weakness (generalized)  ONSET DATE: May 2025  SUBJECTIVE:  SUBJECTIVE STATEMENT: Pt states he had to change a tire yesterday and had to do more crawling around and pushing/pulling to knock off the spinner/nuts. Pt reports no pain but is very tight and stiff today.   IE: Pt states he is here today for R sided LBP. Pt denies NT into the legs. Pt does not have sudden weakness into the legs. Pt does have particular difficulty during activity but things like mopping or vacuuming with cause and create pain. 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 Pain has been ongoing for about 6 months. Pt reports maybe lifting something that caused the initial pain. Recently, he started having a sharp, stabbing pain into the R low back and right above the hip. Feels he is more cautious now due to pain. Pt has a gym membership but does not actively use it. Pt reports gradual loss of strength over the years which limits his activity around the home vs back pain limiting.   Pt has seen chiro without much relief.   Pt reports that no issues identified with cardiac testing.   PERTINENT HISTORY:  HLD, HTN, skin cancer, knee OA  PAIN:  Are you having pain? No: NPRS scale: 0/10; 3/10 at worst  Pain location: R sided LBP Pain description: stabbing Aggravating factors: rotation, sitting and then transition   Relieving factors: gabapentin , nothing else helps pain   PRECAUTIONS: None  RED FLAGS: None   WEIGHT BEARING RESTRICTIONS: No  FALLS:  Has patient fallen in last 6 months? No  LIVING ENVIRONMENT: Lives with: lives with their family and lives with their spouse Lives in: House/apartment Stairs: 3 steps to change floors  Has following equipment at home: None  OCCUPATION: retired  Recreation: walking, walking dogs   PLOF: Independent  PATIENT GOALS: reduce pain and return to normal activity  OBJECTIVE:  Note: Objective measures were completed at Evaluation unless otherwise noted.  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   PATIENT SURVEYS:  FOTO 65 69@ DC 5 pts MCII  SCREENING FOR RED FLAGS: Bowel or bladder incontinence: No Spinal tumors: No Cauda equina syndrome: No Compression fracture: No Abdominal aneurysm: No  COGNITION: Overall cognitive status: Within functional limits for tasks assessed     SENSATION: WFL  MUSCLE LENGTH: Hamstrings: positive supine 90/90 Positive Ely's  POSTURE: rounded shoulders and increased thoracic kyphosis  PALPATION: TTP and significant hypertoncity of bilat R>L lumbar paraspinals, QL, glutes and hip rotators  LUMBAR ROM:   AROM eval  Flexion 50%  Extension 25%  Right lateral flexion 75%  Left lateral flexion 75%  Right rotation 75%  Left rotation 75%   (Blank rows = not tested)  LOWER EXTREMITY ROM:   none painful  Active  Right eval Left eval  Hip flexion 100 100  Hip extension -5 -5  Hip abduction    Hip adduction    Hip internal rotation 20 20  Hip external rotation 35 30  Knee flexion    Knee extension    Ankle dorsiflexion    Ankle plantarflexion    Ankle inversion    Ankle eversion     (Blank rows = not tested)  LOWER EXTREMITY MMT:  none painful  MMT Right eval Left eval  Hip flexion 4+/5 4+/5  Hip extension    Hip abduction 4+/5 4+/5  Hip adduction 4+/5 4+/5   Hip internal rotation    Hip external rotation  Knee flexion    Knee extension 5/5 5/5  Ankle dorsiflexion 5/5 5/5  Ankle plantarflexion    Ankle inversion    Ankle eversion     (Blank rows = not tested)  LUMBAR SPECIAL TESTS:  Slump test: Negative, SI Compression/distraction test: Negative, and FABER test: Negative   GAIT: Distance walked: 57ft Assistive device utilized: None Level of assistance: Complete Independence Comments: decreased bilat step length, lack of hip rotation and hip extension  TODAY'S TREATMENT:                                                                                                                              DATE:  12/31  L1-5 UPA and CPA grade III-IV STM R lumbar paraspinals and QL Figure 4 rotation LTR 20x Attempted child's pose, unable Strap ITB stretch 15s 4x Strap HS stretch 30s 3x Paloff RTB 2x10   12/17  Seated piriformis stretch 30s 3x  Figure 4 rotation LTR 20x LTR 10x Bridge 3x10 Seated piriformis stretch 30s 2x each Standing hip flexor stretch 30s 3x 5lb jefferson curl 3x5 5lb weighted SB 2x10 each  12/9  L1-5 UPA and CPA grade III-IV STM R lumbar paraspinals and QL STM R glutes and hip rotators  Seated piriformis stretch 30s 3x  STS from mid height table 3x8 Standing wall hip ext 2x15 Seated flexion with stool 3s 10x ea with SB L and R  12/4 Nu-step L4 x30min LTR 5 x10ea Bridge 3-second hold x 10 Piriformis stretch 22nd hold 2 x 10 each HSS seated 20seconds x2ea Seated QL stretch 10seconds x3ea side Seated lumbar flexion stretch (reach to floor) 5 x5 Supine march with TAC 2x10ea PPT 3 x10 HEP update and review    11/25   Exercises - Supine Lower Trunk Rotation  - 2 x daily - 7 x weekly - 2 sets - 10 reps - 2 hold - Supine Bridge  - 2 x daily - 7 x weekly - 2 sets - 10 reps - Seated Quadratus Lumborum Stretch in Chair  - 2 x daily - 7 x weekly - 1 sets - 3 reps - 30 hold - Jefferson Curl  - 2 x  daily - 7 x weekly - 1 sets - 5 reps     PATIENT EDUCATION:  Education details:  MOI, diagnosis, prognosis, anatomy, exercise progression, DOMS expectations, muscle firing,  envelope of function, HEP, POC  Person educated: Patient Education method: Explanation, Demonstration, Tactile cues, Verbal cues,  Education comprehension: verbalized understanding, returned demonstration, and verbal cues required     HOME EXERCISE PROGRAM:    Access Code: [email or web address] URL: [email or web address] Date: 04/07/2023 Prepared by: Dale Call             ASSESSMENT:   CLINICAL IMPRESSION:   Pt presents with increase in lumbar stiffness from recent increase in heavier activity. Pt able to improve stiffness with manual followed by mobility exercise. Pt was also able to demo good  technique with abdominal bracing vs bulging to improve intraabdominal pressure with lifting and heavy activity at home. Pt continues to have well managed pain with current tightness likely to resolve with continued HEP. Plan for last session at next with re-cert needed there is a reoccurrence of pain. Pt would benefit from continued skilled therapy in order to reach goals and maximize functional lumbopelvic strength and ROM for return to normal mobility and ADL.    Eval: Patient is a 77  y.o. male who was seen today for physical therapy evaluation and treatment for c/c of LBP. 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 Pt has significant joint stiffness and soft tissue restriction of the posterior aspect of bilat hips R>L. Pt is stiffness dominant with minimally sensitive or irritable pain during clinical testing. Pt has most difficulty with getting lumbar and hip motion. Plan to work on lumbopelvic mobility and generalized strengthening as tolerated. Plan for joint mobs or soft tissue mobilization PRN with focus on a succinct list of exercise for pt to complete.  Pt would benefit from continued skilled therapy in order  to reach goals and maximize functional lumbopelvic strength and ROM for return to normal mobility and ADL.    OBJECTIVE IMPAIRMENTS Abnormal gait, decreased activity tolerance, decreased endurance, decreased mobility, difficulty walking, decreased ROM, decreased strength, hypomobility, increased muscle spasms, impaired flexibility, improper body mechanics, postural dysfunction, and pain.    ACTIVITY LIMITATIONS carrying, lifting, bending, sitting, standing, squatting, sleeping, stairs, transfers, and locomotion level   PARTICIPATION LIMITATIONS: cleaning, laundry, interpersonal relationship, driving, shopping, community activity, occupation, and exercise   PERSONAL FACTORS Age, Fitness, Time since onset of injury/illness/exacerbation, and 1-2 comorbidities:  are also affecting patient's functional outcome.          GOALS:     SHORT TERM GOALS: Target date: 05/19/2023       Pt will become independent with HEP in order to demonstrate synthesis of PT education.     Goal status: met   2.  Pt will report at least 2 pt reduction on NPRS scale for pain at worst in order to demonstrate functional improvement with household activity, self care, and ADL.    Goal status: met   3.  Pt will score at least 5 pt increase on FOTO to demonstrate functional improvement in MCII and pt perceived function.     Goal status: ongoing     LONG TERM GOALS: Target date: 06/30/2023        Pt  will become independent with final HEP in order to demonstrate synthesis of PT education.    Goal status: INITIAL   2.  Pt will score >/= 69 on FOTO to demonstrate improvement in perceived lumbar function.      Goal status: INITIAL   3.  Pt will be able to demonstrate/report ability to sit/stand/sleep for extended periods of time without pain in order to demonstrate functional improvement and tolerance to static positioning.    Goal status: INITIAL   4.  Pt will be able to demonstrate/report ability to bend,  lift, mop without pain in order to demonstrate functional improvement and tolerance to exercise and community mobility.    Goal status: INITIAL     PLAN:   PLAN: PT FREQUENCY: 1-2x/week   PT DURATION: 12 weeks (plan to D/C in 8 wks)    PLANNED INTERVENTIONS: Therapeutic exercises, Therapeutic activity, Neuromuscular re-education, Balance training, Gait training, Patient/Family education, Joint manipulation, Joint mobilization, Stair training, Orthotic/Fit training, DME instructions, Aquatic Therapy, Dry  Needling, Electrical stimulation, Spinal manipulation, Spinal mobilization, Cryotherapy, Moist heat, scar mobilization, Splintting, Taping, Vasopneumatic device, Traction, Ultrasound, Ionotophoresis 4mg /ml Dexamethasone , Manual therapy, and Re-evaluation.   PLAN FOR NEXT SESSION: R QL STM*, R lumbar UPA*; progressive hip and lumbar stretching, hip hinging motions, lumbar flexion and ext mobility  Dale Call, PT 05/13/2023, 11:44 AM

## 2023-05-20 ENCOUNTER — Ambulatory Visit (HOSPITAL_BASED_OUTPATIENT_CLINIC_OR_DEPARTMENT_OTHER): Payer: Medicare (Managed Care) | Attending: Family | Admitting: Physical Therapy

## 2023-05-20 ENCOUNTER — Encounter (HOSPITAL_BASED_OUTPATIENT_CLINIC_OR_DEPARTMENT_OTHER): Payer: Self-pay | Admitting: Physical Therapy

## 2023-05-20 DIAGNOSIS — M6281 Muscle weakness (generalized): Secondary | ICD-10-CM | POA: Insufficient documentation

## 2023-05-20 DIAGNOSIS — M545 Low back pain, unspecified: Secondary | ICD-10-CM | POA: Diagnosis present

## 2023-05-20 NOTE — Therapy (Signed)
 OUTPATIENT PHYSICAL THERAPY THORACOLUMBAR EVALUATION   PHYSICAL THERAPY DISCHARGE SUMMARY  Visits from Start of Care: 6  Plan: Patient agrees to discharge.  Patient goals were met. Patient is being discharged due to meeting the stated rehab goals.          Patient Name: Christian [name] MRN: [id number] DOB:1946-04-13, 78 y.o., male Today's Date: 05/20/2023  END OF SESSION:  PT End of Session - 05/20/23 1111     Visit Number 6    Number of Visits 12    Date for PT Re-Evaluation 07/06/23    Authorization Type [employer]    PT Start Time 1102    PT Stop Time 1126    PT Time Calculation (min) 24 min    Activity Tolerance Patient tolerated treatment well    Behavior During Therapy [facility] for tasks assessed/performed                Past Medical History:  Diagnosis Date   Back pain 06/04/2015   DVT of lower limb, acute (HCC) 01/13/2015   High blood pressure    High cholesterol    History of chicken pox 01/13/2015   Hyperlipidemia, mild 01/13/2015   Hypokalemia 01/13/2015   Obesity    Verrucous skin lesion 02/08/2016   Past Surgical History:  Procedure Laterality Date   CARDIOVERSION N/A 09/30/2022   Procedure: CARDIOVERSION;  Surgeon: Rolan Ezra RAMAN, MD;  Location: [facility] INVASIVE CV LAB;  Service: Cardiovascular;  Laterality: N/A;   EYE SURGERY Bilateral 02/2019   Dr Lavonia   RIGHT HEART CATH N/A 12/04/2021   Procedure: RIGHT HEART CATH;  Surgeon: Rolan Ezra RAMAN, MD;  Location: [facility] INVASIVE CV LAB;  Service: Cardiovascular;  Laterality: N/A;   Patient Active Problem List   Diagnosis Date Noted   Seborrheic dermatitis 04/27/2021   Coronary artery disease involving native heart without angina pectoris 04/27/2021   Osteoarthritis of knee 04/27/2021   Chest pressure 04/23/2021   Right hip pain 03/04/2021   Cataract 11/20/2017   Right shoulder pain 05/20/2017   Diverticulosis 05/20/2017   Preventative health care 05/20/2017   Verrucous skin lesion 02/08/2016   Low back pain  06/04/2015   Nonallergic rhinitis 02/10/2015   Sun-damaged skin 01/29/2015   Benign essential HTN 01/29/2015   Hyperglycemia 01/29/2015   Hyperlipidemia, mild 01/13/2015   History of chicken pox 01/13/2015   Hypokalemia 01/13/2015   Obesity    Obstructive sleep apnea 06/10/2014    PCP: Domenica Harlene DELENA, MD   REFERRING PROVIDER:  Daryl Setter, NP     REFERRING DIAG:  M54.50 (ICD-10-CM) - Right-sided low back pain without sciatica, unspecified chronicity      Rationale for Evaluation and Treatment: Rehabilitation  THERAPY DIAG:  Pain, lumbar region  Muscle weakness (generalized)  ONSET DATE: May 2025  SUBJECTIVE:  SUBJECTIVE STATEMENT: Pt states his back is doing much better. He has had no issues with his back since last session. The pain is gone. Pt feels he is back to usual. He is getting a new puppy and will be walking more again.   IE: Pt states he is here today for R sided LBP. Pt denies NT into the legs. Pt does not have sudden weakness into the legs. Pt does have particular difficulty during activity but things like mopping or vacuuming with cause and create pain. 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 Pain has been ongoing for about 6 months. Pt reports maybe lifting something that caused the initial pain. Recently, he started having a sharp, stabbing pain into the R low back and right above the hip. Feels he is more cautious now due to pain. Pt has a gym membership but does not actively use it. Pt reports gradual loss of strength over the years which limits his activity around the home vs back pain limiting.   Pt has seen chiro without much relief.   Pt reports that no issues identified with cardiac testing.   PERTINENT  HISTORY:  HLD, HTN, skin cancer, knee OA  PAIN:  Are you having pain? No: NPRS scale: 0/10; 3/10 at worst  Pain location: R sided LBP Pain description: stabbing Aggravating factors: rotation, sitting and then transition  Relieving factors: gabapentin , nothing else helps pain   PRECAUTIONS: None  RED FLAGS: None   WEIGHT BEARING RESTRICTIONS: No  FALLS:  Has patient fallen in last 6 months? No  LIVING ENVIRONMENT: Lives with: lives with their family and lives with their spouse Lives in: House/apartment Stairs: 3 steps to change floors  Has following equipment at home: None  OCCUPATION: retired  Recreation: walking, walking dogs   PLOF: Independent  PATIENT GOALS: reduce pain and return to normal activity  OBJECTIVE:  Note: Objective measures were completed at Evaluation unless otherwise noted.  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   PATIENT SURVEYS:  FOTO 65 69@ DC 5 pts MCII  FOTO 1/7 83% GOAL MET  SCREENING FOR RED FLAGS: Bowel or bladder incontinence: No Spinal tumors: No Cauda equina syndrome: No Compression fracture: No Abdominal aneurysm: No  COGNITION: Overall cognitive status: Within functional limits for tasks assessed     SENSATION: WFL  MUSCLE LENGTH: Hamstrings: positive supine 90/90 Positive Ely's  POSTURE: rounded shoulders and increased thoracic kyphosis  PALPATION: TTP and significant hypertoncity of bilat R>L lumbar paraspinals, QL, glutes and hip rotators  LUMBAR ROM:   AROM 1/7  Flexion 75%  Extension 50%  Right lateral flexion 75%  Left lateral flexion 75%  Right rotation 75%  Left rotation 75%   (Blank rows = not tested)  LOWER EXTREMITY ROM:   none painful and WFL; still tight with leg crossed ER  LOWER EXTREMITY MMT:  none painful  MMT Right 1/7 Left 1/7  Hip flexion 5/5 55  Hip extension    Hip abduction 5/5 5/5  Hip adduction 4+/5 4+/5  Hip internal rotation    Hip external  rotation    Knee flexion    Knee extension 5/5 5/5  Ankle dorsiflexion 5/5 5/5  Ankle plantarflexion    Ankle inversion    Ankle eversion    GAIT: Distance walked: 34ft Assistive device utilized: None Level of assistance: Complete Independence Comments: decreased bilat step length  TODAY'S TREATMENT:  DATE:   1/7 D/C instructions, exercise taper, exercise progression regression, graded exposure  Exercises - Supine Lower Trunk Rotation  - 2 x daily - 7 x weekly - 2 sets - 10 reps - 2 hold - Supine Bridge  - 2 x daily - 7 x weekly - 2 sets - 10 reps - 3seconds hold - Standing Hip Flexor Stretch  - 2 x daily - 7 x weekly - 1 sets - 2 reps - 30 hold - Jefferson Curl  - 2 x daily - 7 x weekly - 1 sets - 5 reps - Sit to Stand  - 1 x daily - 7 x weekly - 3 sets - 8 reps - Seated Piriformis Stretch  - 2 x daily - 7 x weekly - 1 sets - 3 reps - 30 hold - Standing Anti-Rotation Press with Anchored Resistance  - 1 x daily - 7 x weekly - 2 sets - 10 reps  12/31  L1-5 UPA and CPA grade III-IV STM R lumbar paraspinals and QL Figure 4 rotation LTR 20x Attempted child's pose, unable Strap ITB stretch 15s 4x Strap HS stretch 30s 3x Paloff RTB 2x10   12/17  Seated piriformis stretch 30s 3x  Figure 4 rotation LTR 20x LTR 10x Bridge 3x10 Seated piriformis stretch 30s 2x each Standing hip flexor stretch 30s 3x 5lb jefferson curl 3x5 5lb weighted SB 2x10 each  12/9  L1-5 UPA and CPA grade III-IV STM R lumbar paraspinals and QL STM R glutes and hip rotators  Seated piriformis stretch 30s 3x  STS from mid height table 3x8 Standing wall hip ext 2x15 Seated flexion with stool 3s 10x ea with SB L and R  12/4 Nu-step L4 x45min LTR 5 x10ea Bridge 3-second hold x 10 Piriformis stretch 22nd hold 2 x 10 each HSS seated 20seconds x2ea Seated QL stretch  10seconds x3ea side Seated lumbar flexion stretch (reach to floor) 5 x5 Supine march with TAC 2x10ea PPT 3 x10 HEP update and review    11/25   Exercises - Supine Lower Trunk Rotation  - 2 x daily - 7 x weekly - 2 sets - 10 reps - 2 hold - Supine Bridge  - 2 x daily - 7 x weekly - 2 sets - 10 reps - Seated Quadratus Lumborum Stretch in Chair  - 2 x daily - 7 x weekly - 1 sets - 3 reps - 30 hold - Jefferson Curl  - 2 x daily - 7 x weekly - 1 sets - 5 reps     PATIENT EDUCATION:  Education details:  HEP, POC, edu as above  Person educated: Patient Education method: Explanation, Demonstration, Tactile cues, Verbal cues,  Education comprehension: verbalized understanding, returned demonstration, and verbal cues required     HOME EXERCISE PROGRAM:    Access Code: [email or web address] URL: [email or web address] Date: 04/07/2023 Prepared by: Dale Call             ASSESSMENT:   CLINICAL IMPRESSION:   Pt has met all PT goals and no longer has complaint of back pain. Pt is independent with HEP and able to manage back pain with current program. Plan for D/C of current episode as pt has exceeded FOTO goal, improved objective measures, and met PT goals.    Eval: Patient is a 78  y.o. male who was seen today for physical therapy evaluation and treatment for c/c of LBP. 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 Pt has significant joint stiffness and soft tissue  restriction of the posterior aspect of bilat hips R>L. Pt is stiffness dominant with minimally sensitive or irritable pain during clinical testing. Pt has most difficulty with getting lumbar and hip motion. Plan to work on lumbopelvic mobility and generalized strengthening as tolerated. Plan for joint mobs or soft tissue mobilization PRN with focus on a succinct list of exercise for pt to complete.  Pt would benefit from continued skilled therapy in order to reach goals and maximize functional lumbopelvic strength and  ROM for return to normal mobility and ADL.    OBJECTIVE IMPAIRMENTS Abnormal gait, decreased activity tolerance, decreased endurance, decreased mobility, difficulty walking, decreased ROM, decreased strength, hypomobility, increased muscle spasms, impaired flexibility, improper body mechanics, postural dysfunction, and pain.    ACTIVITY LIMITATIONS carrying, lifting, bending, sitting, standing, squatting, sleeping, stairs, transfers, and locomotion level   PARTICIPATION LIMITATIONS: cleaning, laundry, interpersonal relationship, driving, shopping, community activity, occupation, and exercise   PERSONAL FACTORS Age, Fitness, Time since onset of injury/illness/exacerbation, and 1-2 comorbidities:  are also affecting patient's functional outcome.          GOALS:     SHORT TERM GOALS: Target date: 05/19/2023       Pt will become independent with HEP in order to demonstrate synthesis of PT education.     Goal status: met   2.  Pt will report at least 2 pt reduction on NPRS scale for pain at worst in order to demonstrate functional improvement with household activity, self care, and ADL.    Goal status: met   3.  Pt will score at least 5 pt increase on FOTO to demonstrate functional improvement in MCII and pt perceived function.     Goal status: MET     LONG TERM GOALS: Target date: 06/30/2023        Pt  will become independent with final HEP in order to demonstrate synthesis of PT education.    Goal status: met   2.  Pt will score >/= 69 on FOTO to demonstrate improvement in perceived lumbar function.      Goal status: met   3.  Pt will be able to demonstrate/report ability to sit/stand/sleep for extended periods of time without pain in order to demonstrate functional improvement and tolerance to static positioning.    Goal status: met   4.  Pt will be able to demonstrate/report ability to bend, lift, mop without pain in order to demonstrate functional improvement and  tolerance to exercise and community mobility.    Goal status: met     PLAN:   PLAN: PT FREQUENCY: 1-2x/week   PT DURATION: 12 weeks (plan to D/C in 8 wks)    PLANNED INTERVENTIONS: Therapeutic exercises, Therapeutic activity, Neuromuscular re-education, Balance training, Gait training, Patient/Family education, Joint manipulation, Joint mobilization, Stair training, Orthotic/Fit training, DME instructions, Aquatic Therapy, Dry Needling, Electrical stimulation, Spinal manipulation, Spinal mobilization, Cryotherapy, Moist heat, scar mobilization, Splintting, Taping, Vasopneumatic device, Traction, Ultrasound, Ionotophoresis 4mg /ml Dexamethasone , Manual therapy, and Re-evaluation.     Dale Call, PT 05/20/2023, 11:37 AM

## 2023-06-08 ENCOUNTER — Other Ambulatory Visit (HOSPITAL_COMMUNITY): Payer: Self-pay | Admitting: Cardiology

## 2023-06-15 ENCOUNTER — Other Ambulatory Visit: Payer: Self-pay | Admitting: Family Medicine

## 2023-08-17 NOTE — Assessment & Plan Note (Signed)
 Monitor and report any concerns, no changes to meds. Encouraged heart healthy diet such as the DASH diet and exercise as tolerated.  ?

## 2023-08-17 NOTE — Assessment & Plan Note (Signed)
 [id number] acceptable, minimize simple carbs. Increase exercise as tolerated.

## 2023-08-17 NOTE — Assessment & Plan Note (Signed)
He has been walking 2-3 miles a day and eating well, avoiding most sugar. Encouraged ongoing efforts and monitor labs.

## 2023-08-17 NOTE — Assessment & Plan Note (Signed)
 Encourage heart healthy diet such as MIND or DASH diet, increase exercise, avoid trans fats, simple carbohydrates and processed foods, consider a krill or fish or flaxseed oil cap daily.

## 2023-08-18 ENCOUNTER — Ambulatory Visit (INDEPENDENT_AMBULATORY_CARE_PROVIDER_SITE_OTHER): Payer: Medicare HMO | Admitting: Family Medicine

## 2023-08-18 ENCOUNTER — Encounter: Payer: Self-pay | Admitting: Family Medicine

## 2023-08-18 VITALS — BP 142/78 | HR 75 | Temp 98.2°F | Resp 18 | Ht 68.0 in | Wt 227.0 lb

## 2023-08-18 DIAGNOSIS — I5032 Chronic diastolic (congestive) heart failure: Secondary | ICD-10-CM

## 2023-08-18 DIAGNOSIS — E785 Hyperlipidemia, unspecified: Secondary | ICD-10-CM

## 2023-08-18 DIAGNOSIS — R739 Hyperglycemia, unspecified: Secondary | ICD-10-CM

## 2023-08-18 DIAGNOSIS — Z0000 Encounter for general adult medical examination without abnormal findings: Secondary | ICD-10-CM

## 2023-08-18 DIAGNOSIS — I10 Essential (primary) hypertension: Secondary | ICD-10-CM

## 2023-08-18 DIAGNOSIS — E66812 Obesity, class 2: Secondary | ICD-10-CM

## 2023-08-18 NOTE — Patient Instructions (Addendum)
 Prevnar 20 once at pharmacy Tetanus at pharmacy and/or if injured  Shingrix is the new shingles shot, 2 shots over 2-6 months, confirm coverage with insurance and document, then can return here for shots with nurse appt or at [facility] 62 Years and Older, Male Preventive care refers to lifestyle choices and visits with your health care provider that can promote health and wellness. Preventive care visits are also called wellness exams. What can I expect for my preventive care visit? Counseling During your preventive care visit, your health care provider may ask about your: Medical history, including: Past medical problems. Family medical history. History of falls. Current health, including: Emotional well-being. Home life and relationship well-being. Sexual activity. Memory and ability to understand (cognition). Lifestyle, including: Alcohol, nicotine or tobacco, and drug use. Access to firearms. Diet, exercise, and sleep habits. Work and work Astronomer. Sunscreen use. Safety issues such as seatbelt and bike helmet use. Physical exam Your health care provider will check your: Height and weight. These may be used to calculate your BMI (body mass index). BMI is a measurement that tells if you are at a healthy weight. Waist circumference. This measures the distance around your waistline. This measurement also tells if you are at a healthy weight and may help predict your risk of certain diseases, such as type 2 diabetes and high blood pressure. Heart rate and blood pressure. Body temperature. Skin for abnormal spots. What immunizations do I need?  Vaccines are usually given at various ages, according to a schedule. Your health care provider will recommend vaccines for you based on your age, medical history, and lifestyle or other factors, such as travel or where you work. What tests do I need? Screening Your health care provider may recommend screening tests  for certain conditions. This may include: Lipid and cholesterol levels. Diabetes screening. This is done by checking your blood sugar (glucose) after you have not eaten for a while (fasting). Hepatitis C test. Hepatitis B test. HIV (human immunodeficiency virus) test. STI (sexually transmitted infection) testing, if you are at risk. Lung cancer screening. Colorectal cancer screening. Prostate cancer screening. Abdominal aortic aneurysm (AAA) screening. You may need this if you are a current or former smoker. Talk with your health care provider about your test results, treatment options, and if necessary, the need for more tests. Follow these instructions at home: Eating and drinking  Eat a diet that includes fresh fruits and vegetables, whole grains, lean protein, and low-fat dairy products. Limit your intake of foods with high amounts of sugar, saturated fats, and salt. Take vitamin and mineral supplements as recommended by your health care provider. Do not drink alcohol if your health care provider tells you not to drink. If you drink alcohol: Limit how much you have to 0-2 drinks a day. Know how much alcohol is in your drink. In the [place] one drink equals one 12 oz bottle of beer (355 mL), one 5 oz glass of wine (148 mL), or one 1 oz glass of hard liquor (44 mL). Lifestyle Brush your teeth every morning and night with fluoride toothpaste. Floss one time each day. Exercise for at least 30 minutes 5 or more days each week. Do not use any products that contain nicotine or tobacco. These products include cigarettes, chewing tobacco, and vaping devices, such as e-cigarettes. If you need help quitting, ask your health care provider. Do not use drugs. If you are sexually active, practice safe sex. Use a condom or  other form of protection to prevent STIs. Take aspirin only as told by your health care provider. Make sure that you understand how much to take and what form to take. Work with your  health care provider to find out whether it is safe and beneficial for you to take aspirin daily. Ask your health care provider if you need to take a cholesterol-lowering medicine (statin). Find healthy ways to manage stress, such as: Meditation, yoga, or listening to music. Journaling. Talking to a trusted person. Spending time with friends and family. Safety Always wear your seat belt while driving or riding in a vehicle. Do not drive: If you have been drinking alcohol. Do not ride with someone who has been drinking. When you are tired or distracted. While texting. If you have been using any mind-altering substances or drugs. Wear a helmet and other protective equipment during sports activities. If you have firearms in your house, make sure you follow all gun safety procedures. Minimize exposure to UV radiation to reduce your risk of skin cancer. What's next? Visit your health care provider once a year for an annual wellness visit. Ask your health care provider how often you should have your eyes and teeth checked. Stay up to date on all vaccines. This information is not intended to replace advice given to you by your health care provider. Make sure you discuss any questions you have with your health care provider. Document Revised: 10/25/2020 Document Reviewed: 10/25/2020 Elsevier Patient Education  2024 [employer].

## 2023-08-18 NOTE — Progress Notes (Signed)
 Subjective:    Patient ID: Christian [name], male    DOB: 1946-04-23, 78 y.o.   MRN: [id number]  Chief Complaint  Patient presents with   Annual Exam    HPI Discussed the use of AI scribe software for clinical note transcription with the patient, who gave verbal consent to proceed.  History of Present Illness Christian [name] "Christian [name]" is a 78 year old male who presents for a routine follow-up visit.  He has been experiencing clear, mucus-like nasal drainage at night and in the morning for the past month and a half. No fevers have been noted, and the symptoms have remained stable since onset. Drinking water before returning to bed at night seems to alleviate the problem.  He mentions experiencing more general aches, pains, and stiffness, which may be related to his history of arthritis. No new or worsening symptoms have been specified.  He has been dealing with back pain and was prescribed gabapentin by his assistant, Rayford Halsted. The medication has been effective in alleviating his back pain.  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 Physical therapy has been beneficial in managing his pain and improving balance.  He discusses his vaccination history, noting his last tetanus shot was in 2008. He has received the shingles vaccine, but only one of the new series, and has had previous pneumonia vaccines, Prevnar 13 and Pneumovax, but not the new Prevnar 20.  Following the death of his 47 year old dog before Christmas, he acquired a new puppy, a [place], which has required him to wake up early and be more active, marking a significant lifestyle change.  He reports regular bowel movements every day at 9 o'clock, with constipation occurring only about three times a year. He has reduced sugar intake over the past month, except for wine, and has been mindful of his diet and exercise, including stretching exercises recommended by a physical therapist.  No  fevers, trouble swallowing, cough, breathing difficulties, chest pain, or new urinary issues. No recent headaches or changes in hearing or vision.    Past Medical History:  Diagnosis Date   Back pain 06/04/2015   DVT of lower limb, acute (HCC) 01/13/2015   High blood pressure    High cholesterol    History of chicken pox 01/13/2015   Hyperlipidemia, mild 01/13/2015   Hypokalemia 01/13/2015   Obesity    Verrucous skin lesion 02/08/2016    Past Surgical History:  Procedure Laterality Date   CARDIOVERSION N/A 09/30/2022   Procedure: CARDIOVERSION;  Surgeon: Laurey Morale, MD;  Location: [facility] INVASIVE CV LAB;  Service: Cardiovascular;  Laterality: N/A;   EYE SURGERY Bilateral 02/2019   Dr Vonna Kotyk   RIGHT HEART CATH N/A 12/04/2021   Procedure: RIGHT HEART CATH;  Surgeon: Laurey Morale, MD;  Location: [facility] INVASIVE CV LAB;  Service: Cardiovascular;  Laterality: N/A;    Family History  Problem Relation Age of Onset   Stroke Father        from surgery   Emphysema Father        smoker   Heart disease Father        carotid artery disease, vasculopathy   Allergic Disorder Sister     Social History   Socioeconomic History   Marital status: Married    Spouse name: Not on file   Number of children: 1   Years of education: Not on file   Highest education level: Associate degree: academic program  Occupational History   Occupation: Retired  Tobacco Use   Smoking status: Never   Smokeless tobacco: Never  Substance and Sexual Activity   Alcohol use: Yes    Alcohol/week: 0.0 standard drinks of alcohol    Comment: 2-3 glasses of wine every night   Drug use: No   Sexual activity: Yes    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, retired from English as a second language teacher, no dietary restrictions  Other Topics Concern   Not on file  Social History Narrative   Not on file   Social Drivers of Health   Financial Resource Strain: Low Risk  (09/05/2022)   Overall Financial Resource Strain (CARDIA)    Difficulty  of Paying Living Expenses: Not hard at all  Food Insecurity: No Food Insecurity (09/05/2022)   Hunger Vital Sign    Worried About Running Out of Food in the Last Year: Never true    Ran Out of Food in the Last Year: Never true  Transportation Needs: No Transportation Needs (09/05/2022)   PRAPARE - Administrator, Civil Service (Medical): No    Lack of Transportation (Non-Medical): No  Physical Activity: Insufficiently Active (09/05/2022)   Exercise Vital Sign    Days of Exercise per Week: 7 days    Minutes of Exercise per Session: 20 min  Stress: No Stress Concern Present (09/05/2022)   [employer] of Occupational Health - Occupational Stress Questionnaire    Feeling of Stress : Not at all  Social Connections: Moderately Isolated (09/05/2022)   Social Connection and Isolation Panel [NHANES]    Frequency of Communication with Friends and Family: More than three times a week    Frequency of Social Gatherings with Friends and Family: Once a week    Attends Religious Services: Never    Database administrator or Organizations: No    Attends Engineer, structural: Not on file    Marital Status: Married  Catering manager Violence: Not on file    Outpatient Medications Prior to Visit  Medication Sig Dispense Refill   apixaban (ELIQUIS) 5 MG TABS tablet Take 1 tablet (5 mg total) by mouth 2 (two) times daily. 60 tablet 11   FARXIGA 10 MG TABS tablet TAKE 1 TABLET BY MOUTH DAILY BEFORE BREAKFAST. 90 tablet 3   gabapentin (NEURONTIN) 100 MG capsule Take 1 capsule (100 mg total) by mouth 3 (three) times daily. 90 capsule 3   losartan (COZAAR) 100 MG tablet Take 1 tablet (100 mg total) by mouth daily. 90 tablet 1   Multiple Vitamins-Minerals (PRESERVISION AREDS 2 PO) Take 1 capsule by mouth 2 (two) times daily.     potassium chloride SA (KLOR-CON M) 20 MEQ tablet Take 1 tablet (20 mEq total) by mouth daily. 90 tablet 3   rosuvastatin (CRESTOR) 20 MG tablet Take 1 tablet (20  mg total) by mouth daily. 90 tablet 3   hydrochlorothiazide (HYDRODIURIL) 25 MG tablet Take 1 tablet (25 mg total) by mouth daily. 90 tablet 3   predniSONE (DELTASONE) 10 MG tablet 4 tabs by mouth once daily for 2 days, then 3 tabs daily x 2 days, then 2 tabs daily x 2 days, then 1 tab daily x 2 days 20 tablet 0   No facility-administered medications prior to visit.    Allergies  Allergen Reactions   Codeine Anaphylaxis    Review of Systems  Constitutional:  Negative for fever and malaise/fatigue.  HENT:  Negative for congestion.   Eyes:  Negative for blurred vision.  Respiratory:  Negative for shortness of breath.   Cardiovascular:  Negative for chest pain, palpitations and leg swelling.  Gastrointestinal:  Negative for abdominal pain, blood in stool and nausea.  Genitourinary:  Negative for dysuria and frequency.  Musculoskeletal:  Negative for falls.  Skin:  Negative for rash.  Neurological:  Negative for dizziness, loss of consciousness and headaches.  Endo/Heme/Allergies:  Negative for environmental allergies.  Psychiatric/Behavioral:  Negative for depression. The patient is not nervous/anxious.        Objective:    Physical Exam Vitals reviewed.  Constitutional:      General: He is not in acute distress.    Appearance: Normal appearance. He is not ill-appearing or diaphoretic.  HENT:     Head: Normocephalic and atraumatic.     Right Ear: Tympanic membrane, ear canal and external ear normal. There is no impacted cerumen.     Left Ear: Tympanic membrane, ear canal and external ear normal. There is no impacted cerumen.     Nose: Nose normal. No rhinorrhea.     Mouth/Throat:     Pharynx: Oropharynx is clear.  Eyes:     General: No scleral icterus.    Extraocular Movements: Extraocular movements intact.     Conjunctiva/sclera: Conjunctivae normal.     Pupils: Pupils are equal, round, and reactive to light.  Neck:     Thyroid: No thyroid mass or thyroid tenderness.   Cardiovascular:     Rate and Rhythm: Normal rate and regular rhythm.     Pulses: Normal pulses.     Heart sounds: Normal heart sounds. No murmur heard. Pulmonary:     Effort: Pulmonary effort is normal.     Breath sounds: Normal breath sounds. No wheezing.  Abdominal:     General: Bowel sounds are normal.     Palpations: Abdomen is soft. There is no mass.     Tenderness: There is no guarding.  Musculoskeletal:        General: No swelling. Normal range of motion.     Cervical back: Normal range of motion and neck supple. No rigidity.     Right lower leg: No edema.     Left lower leg: No edema.  Lymphadenopathy:     Cervical: No cervical adenopathy.  Skin:    General: Skin is warm and dry.     Findings: No rash.  Neurological:     General: No focal deficit present.     Mental Status: He is alert and oriented to person, place, and time.     Cranial Nerves: No cranial nerve deficit.     Deep Tendon Reflexes: Reflexes normal.  Psychiatric:        Mood and Affect: Mood normal.        Behavior: Behavior normal.     BP (!) 142/78 (BP Location: Left Arm, Patient Position: Sitting, Cuff Size: Large)   Pulse 75   Temp 98.2 F (36.8 C) (Oral)   Resp 18   Ht 5\' 8"  (1.727 m)   Wt 227 lb (103 kg)   SpO2 97%   BMI 34.52 kg/m  Wt Readings from Last 3 Encounters:  08/18/23 227 lb (103 kg)  03/07/23 223 lb (101.2 kg)  02/13/23 221 lb (100.2 kg)    Diabetic Foot Exam - Simple   No data filed    Lab Results  Component Value Date   WBC 5.8 02/13/2023   HGB 14.1 02/13/2023   HCT 43.7 02/13/2023   PLT 203.0 02/13/2023  GLUCOSE 92 02/13/2023   CHOL 138 02/13/2023   TRIG 107.0 02/13/2023   HDL 54.20 02/13/2023   LDLCALC 63 02/13/2023   ALT 24 02/13/2023   AST 22 02/13/2023   NA 140 02/13/2023   K 4.3 02/13/2023   CL 104 02/13/2023   CREATININE 0.85 02/13/2023   BUN 14 02/13/2023   CO2 30 02/13/2023   TSH 1.98 02/13/2023   PSA 1.57 08/30/2020   HGBA1C 5.6 02/13/2023     Lab Results  Component Value Date   TSH 1.98 02/13/2023   Lab Results  Component Value Date   WBC 5.8 02/13/2023   HGB 14.1 02/13/2023   HCT 43.7 02/13/2023   MCV 94.6 02/13/2023   PLT 203.0 02/13/2023   Lab Results  Component Value Date   NA 140 02/13/2023   K 4.3 02/13/2023   CO2 30 02/13/2023   GLUCOSE 92 02/13/2023   BUN 14 02/13/2023   CREATININE 0.85 02/13/2023   BILITOT 0.6 02/13/2023   ALKPHOS 76 02/13/2023   AST 22 02/13/2023   ALT 24 02/13/2023   PROT 7.6 02/13/2023   ALBUMIN 4.4 02/13/2023   CALCIUM 9.7 02/13/2023   ANIONGAP 10 10/29/2022   EGFR 89 11/22/2022   GFR 83.48 02/13/2023   Lab Results  Component Value Date   CHOL 138 02/13/2023   Lab Results  Component Value Date   HDL 54.20 02/13/2023   Lab Results  Component Value Date   LDLCALC 63 02/13/2023   Lab Results  Component Value Date   TRIG 107.0 02/13/2023   Lab Results  Component Value Date   CHOLHDL 3 02/13/2023   Lab Results  Component Value Date   HGBA1C 5.6 02/13/2023       Assessment & Plan:  Benign essential HTN Assessment & Plan: Monitor and report any concerns, no changes to meds. Encouraged heart healthy diet such as the DASH diet and exercise as tolerated.   Orders: -     CBC with Differential/Platelet; Future -     Comprehensive metabolic panel with GFR; Future -     TSH; Future  Hyperglycemia Assessment & Plan: hgba1c acceptable, minimize simple carbs. Increase exercise as tolerated.   Orders: -     CBC with Differential/Platelet; Future -     Comprehensive metabolic panel with GFR; Future -     TSH; Future -     Hemoglobin A1c; Future  Hyperlipidemia, mild Assessment & Plan: Encourage heart healthy diet such as MIND or DASH diet, increase exercise, avoid trans fats, simple carbohydrates and processed foods, consider a krill or fish or flaxseed oil cap daily.   Orders: -     Lipid panel; Future  Class 2 obesity with body mass index (BMI) of 35.0  to 35.9 in adult, unspecified obesity type, unspecified whether serious comorbidity present Assessment & Plan: He has been walking 2-3 miles a day and eating well, avoiding most sugar. Encouraged ongoing efforts and monitor labs.    Chronic diastolic CHF (congestive heart failure) (HCC) -     Basic metabolic panel with GFR  Preventative health care Assessment & Plan: Patient encouraged to maintain heart healthy diet, regular exercise, adequate sleep. Consider daily probiotics. Take medications as prescribed. Labs reviewed. ACP documents UTD. Colonoscopy 2017 repeat in 10 years if appropriate     Assessment and Plan Assessment & Plan Nasal drainage Intermittent nasal drainage likely due to allergies, vasomotor rhinitis, or residual viral effects. Symptoms not severe enough for immediate intervention. - Consider Flonase and  Mucinex for a week if symptoms worsen. - Monitor for changes in symptoms, such as color change in mucus or fever, which may necessitate further evaluation.  Vaccination status Due for tetanus booster. Discussed Prevnar 20 and shingles vaccine benefits, including reduced risk of shingles and dementia. - Recommend tetanus booster, especially if injured. - Consider receiving the Prevnar 20 vaccine at a pharmacy. - Encourage completion of the shingles vaccine series.  Back pain Chronic back pain with pelvic arthritis. Physical therapy beneficial for pain management and balance. - Continue physical therapy as needed for pain management and balance improvement. - Consider future physical therapy sessions if symptoms worsen or for maintenance.  Goals of Care Discussed importance of advanced directives for emergency medical decisions. - Provide advanced directives to the clinic for inclusion in the medical record.  Follow-up Routine follow-up care to monitor health conditions and ensure continuity of care. - Schedule a six-month follow-up visit and a one-year physical  examination. - Ensure communication with the new nurse practitioner, Shanda Bumps, for continuity of care when the primary doctor is unavailable.     Danise Edge, MD

## 2023-08-18 NOTE — Assessment & Plan Note (Signed)
Patient encouraged to maintain heart healthy diet, regular exercise, adequate sleep. Consider daily probiotics. Take medications as prescribed. Labs reviewed. ACP documents UTD. Colonoscopy 2017 repeat in 10 years if appropriate

## 2023-08-19 ENCOUNTER — Encounter: Payer: Medicare HMO | Admitting: Family Medicine

## 2023-08-19 ENCOUNTER — Encounter: Payer: Self-pay | Admitting: Family Medicine

## 2023-08-19 LAB — COMPREHENSIVE METABOLIC PANEL WITH GFR
ALT: 28 U/L (ref 0–53)
AST: 24 U/L (ref 0–37)
Albumin: 4.4 g/dL (ref 3.5–5.2)
Alkaline Phosphatase: 74 U/L (ref 39–117)
BUN: 18 mg/dL (ref 6–23)
CO2: 27 meq/L (ref 19–32)
Calcium: 9.8 mg/dL (ref 8.4–10.5)
Chloride: 102 meq/L (ref 96–112)
Creatinine, Ser: 0.88 mg/dL (ref 0.40–1.50)
GFR: 82.31 mL/min (ref >60.00)
Glucose, Bld: 109 mg/dL — ABNORMAL HIGH (ref 70–99)
Potassium: 3.6 meq/L (ref 3.5–5.1)
Sodium: 140 meq/L (ref 135–145)
Total Bilirubin: 0.5 mg/dL (ref 0.2–1.2)
Total Protein: 7.8 g/dL (ref 6.0–8.3)

## 2023-08-19 LAB — CBC WITH DIFFERENTIAL/PLATELET
Basophils Absolute: 0.1 10*3/uL (ref 0.0–0.1)
Basophils Relative: 0.8 % (ref 0.0–3.0)
Eosinophils Absolute: 0.2 10*3/uL (ref 0.0–0.7)
Eosinophils Relative: 3.8 % (ref 0.0–5.0)
HCT: 42.8 % (ref 39.0–52.0)
Hemoglobin: 14.4 g/dL (ref 13.0–17.0)
Lymphocytes Relative: 29.7 % (ref 12.0–46.0)
Lymphs Abs: 1.9 10*3/uL (ref 0.7–4.0)
MCHC: 33.7 g/dL (ref 30.0–36.0)
MCV: 95.8 fl (ref 78.0–100.0)
Monocytes Absolute: 0.7 10*3/uL (ref 0.1–1.0)
Monocytes Relative: 11.5 % (ref 3.0–12.0)
Neutro Abs: 3.5 10*3/uL (ref 1.4–7.7)
Neutrophils Relative %: 54.2 % (ref 43.0–77.0)
Platelets: 204 10*3/uL (ref 150.0–400.0)
RBC: 4.47 Mil/uL (ref 4.22–5.81)
RDW: 13.2 % (ref 11.5–15.5)
WBC: 6.5 10*3/uL (ref 4.0–10.5)

## 2023-08-19 LAB — TSH: TSH: 2.74 u[IU]/mL (ref 0.35–5.50)

## 2023-08-19 LAB — HEMOGLOBIN A1C: Hgb A1c MFr Bld: 5.8 % (ref 4.6–6.5)

## 2023-08-19 LAB — LIPID PANEL
Cholesterol: 143 mg/dL (ref 0–200)
HDL: 48.4 mg/dL (ref >39.00)
LDL Cholesterol: 72 mg/dL (ref 0–99)
NonHDL: 94.92
Total CHOL/HDL Ratio: 3
Triglycerides: 115 mg/dL (ref 0.0–149.0)
VLDL: 23 mg/dL (ref 0.0–40.0)

## 2023-08-29 ENCOUNTER — Other Ambulatory Visit (HOSPITAL_COMMUNITY): Payer: Self-pay | Admitting: Cardiology

## 2023-08-31 ENCOUNTER — Other Ambulatory Visit (HOSPITAL_COMMUNITY): Payer: Self-pay | Admitting: Cardiology

## 2023-09-01 ENCOUNTER — Telehealth (HOSPITAL_COMMUNITY): Payer: Self-pay

## 2023-09-01 NOTE — Telephone Encounter (Signed)
 Error

## 2023-09-01 NOTE — Telephone Encounter (Signed)
 error

## 2023-09-01 NOTE — Telephone Encounter (Deleted)
 Called to confirm/remind patient of their appointment at [facility] on 09/02/2023 11:45.   Appointment:   [] Confirmed  [] Left mess   [] No answer/No voice mail  [] VM Full/unable to leave message  [] Phone not in service  Patient reminded to bring all medications and/or complete list.  Confirmed patient has transportation. Gave directions, instructed to utilize valet parking.

## 2023-09-02 ENCOUNTER — Telehealth: Payer: Self-pay

## 2023-09-02 NOTE — Telephone Encounter (Signed)
 Called and spoke with patient. He has an appointment next Wednesday (09/10/23) with Camilo Cella to evaluate back pain for referral.

## 2023-09-02 NOTE — Telephone Encounter (Signed)
 Copied from CRM [id number]. Topic: Referral - Request for Referral >> Sep 02, 2023  2:57 PM Marlan Silva wrote: Did the patient discuss referral with their provider in the last year? No (If No - schedule appointment) (If Yes - send message)  Appointment offered? No  Type of order/referral and detailed reason for visit: Orthopedic Doctor for Lower Back Pain  Preference of office, provider, location: [facility]  If referral order, have you been seen by this specialty before? No (If Yes, this issue or another issue? When? Where?  Can we respond through MyChart? Yes, Patient is requesting a call back once the referral is sent.

## 2023-09-03 DIAGNOSIS — I4891 Unspecified atrial fibrillation: Secondary | ICD-10-CM | POA: Insufficient documentation

## 2023-09-04 ENCOUNTER — Telehealth: Admitting: Physician Assistant

## 2023-09-04 ENCOUNTER — Ambulatory Visit: Payer: Self-pay | Admitting: Family Medicine

## 2023-09-04 DIAGNOSIS — M5441 Lumbago with sciatica, right side: Secondary | ICD-10-CM | POA: Diagnosis not present

## 2023-09-04 MED ORDER — METHYLPREDNISOLONE 4 MG PO TBPK: ORAL_TABLET | ORAL | 0 refills | Status: DC

## 2023-09-04 MED ORDER — TIZANIDINE HCL 2 MG PO TABS: 2.0000 mg | ORAL_TABLET | Freq: Three times a day (TID) | ORAL | 0 refills | Status: DC | PRN

## 2023-09-04 NOTE — Progress Notes (Signed)
 Virtual Visit Consent   Christian [name], you are scheduled for a virtual visit with a [facility] provider today. Just as with appointments in the office, your consent must be obtained to participate. Your consent will be active for this visit and any virtual visit you may have with one of our providers in the next 365 days. If you have a MyChart account, a copy of this consent can be sent to you electronically.  As this is a virtual visit, video technology does not allow for your provider to perform a traditional examination. This may limit your provider's ability to fully assess your condition. If your provider identifies any concerns that need to be evaluated in person or the need to arrange testing (such as labs, EKG, etc.), we will make arrangements to do so. Although advances in technology are sophisticated, we cannot ensure that it will always work on either your end or our end. If the connection with a video visit is poor, the visit may have to be switched to a telephone visit. With either a video or telephone visit, we are not always able to ensure that we have a secure connection.  By engaging in this virtual visit, you consent to the provision of healthcare and authorize for your insurance to be billed (if applicable) for the services provided during this visit. Depending on your insurance coverage, you may receive a charge related to this service.  I need to obtain your verbal consent now. Are you willing to proceed with your visit today? Christian [name] has provided verbal consent on 09/04/2023 for a virtual visit (video or telephone). Christian Kelp, PA-C  Date: 09/04/2023 3:01 PM   Virtual Visit via Video Note   I, Christian [name], connected with  Christian [name]  ([id number], [birth year]) on 09/04/23 at  3:00 PM EDT by a video-enabled telemedicine application and verified that I am speaking with the correct person using two identifiers.  Location: Patient: Virtual Visit Location  Patient: Home Provider: Virtual Visit Location Provider: Home Office   I discussed the limitations of evaluation and management by telemedicine and the availability of in person appointments. The patient expressed understanding and agreed to proceed.    History of Present Illness: Christian [name] is a 78 y.o. who identifies as a male who was assigned male at birth, and is being seen today for back pain.  HPI: Back Pain This is a recurrent problem. The current episode started 1 to 4 weeks ago (Sunday, 08/24/23, did some heavy yard work; has a h/o this late last year the had completely resolved). The problem occurs constantly. The problem has been gradually worsening since onset. The pain is present in the lumbar spine. The quality of the pain is described as shooting, stabbing and aching. The pain radiates to the right foot, right thigh and right knee. The pain is moderate. The pain is The same all the time. The symptoms are aggravated by position. Stiffness is present All day. Pertinent negatives include no bladder incontinence, bowel incontinence, headaches, numbness, paresis, paresthesias, perianal numbness, tingling or weakness. He has tried NSAIDs, ice, heat and bed rest (advil, gabapentin ) for the symptoms. The treatment provided no relief.     Problems:  Patient Active Problem List   Diagnosis Date Noted   Atrial fibrillation (HCC) 09/03/2023   Seborrheic dermatitis 04/27/2021   Coronary artery disease involving native heart without angina pectoris 04/27/2021   Osteoarthritis of knee 04/27/2021   Chest pressure 04/23/2021  Right hip pain 03/04/2021   Cataract 11/20/2017   Right shoulder pain 05/20/2017   Diverticulosis 05/20/2017   Preventative health care 05/20/2017   Verrucous skin lesion 02/08/2016   Low back pain 06/04/2015   Nonallergic rhinitis 02/10/2015   Sun-damaged skin 01/29/2015   Benign essential HTN 01/29/2015   Hyperglycemia 01/29/2015   Hyperlipidemia, mild  01/13/2015   History of chicken pox 01/13/2015   Hypokalemia 01/13/2015   Obesity    Obstructive sleep apnea 06/10/2014   Hypertension 04/19/2013    Allergies:  Allergies  Allergen Reactions   Codeine Anaphylaxis   Medications:  Current Outpatient Medications:    methylPREDNISolone  (MEDROL  DOSEPAK) 4 MG TBPK tablet, 6 day taper; take as directed on package instructions, Disp: 21 tablet, Rfl: 0   tiZANidine  (ZANAFLEX ) 2 MG tablet, Take 1 tablet (2 mg total) by mouth every 8 (eight) hours as needed for muscle spasms., Disp: 30 tablet, Rfl: 0   dapagliflozin  propanediol (FARXIGA ) 10 MG TABS tablet, TAKE 1 TABLET BY MOUTH EVERY DAY BEFORE BREAKFAST, Disp: 90 tablet, Rfl: 0   ELIQUIS  5 MG TABS tablet, TAKE 1 TABLET BY MOUTH TWICE A DAY, Disp: 60 tablet, Rfl: 11   gabapentin  (NEURONTIN ) 100 MG capsule, Take 1 capsule (100 mg total) by mouth 3 (three) times daily., Disp: 90 capsule, Rfl: 3   hydrochlorothiazide  (HYDRODIURIL ) 25 MG tablet, Take 1 tablet (25 mg total) by mouth daily., Disp: 90 tablet, Rfl: 3   hydrochlorothiazide  (HYDRODIURIL ) 50 MG tablet, TAKE 1 TABLET BY MOUTH EVERY DAY, Disp: 90 tablet, Rfl: 2   losartan  (COZAAR ) 100 MG tablet, Take 1 tablet (100 mg total) by mouth daily., Disp: 90 tablet, Rfl: 1   Multiple Vitamins-Minerals (PRESERVISION AREDS 2 PO), Take 1 capsule by mouth 2 (two) times daily., Disp: , Rfl:    potassium chloride  SA (KLOR-CON  M) 20 MEQ tablet, Take 1 tablet (20 mEq total) by mouth daily., Disp: 90 tablet, Rfl: 3   rosuvastatin  (CRESTOR ) 20 MG tablet, Take 1 tablet (20 mg total) by mouth daily., Disp: 90 tablet, Rfl: 3  Observations/Objective: Patient is well-developed, well-nourished in no acute distress.  Resting comfortably at home.  Head is normocephalic, atraumatic.  No labored breathing.  Speech is clear and coherent with logical content.  Patient is alert and oriented at baseline.    Assessment and Plan: 1. Acute midline low back pain with  right-sided sciatica (Primary) - methylPREDNISolone  (MEDROL  DOSEPAK) 4 MG TBPK tablet; 6 day taper; take as directed on package instructions  Dispense: 21 tablet; Refill: 0 - tiZANidine  (ZANAFLEX ) 2 MG tablet; Take 1 tablet (2 mg total) by mouth every 8 (eight) hours as needed for muscle spasms.  Dispense: 30 tablet; Refill: 0  - Suspect back strain causing sciatic pain - Failed NSAIDs - Add Medrol  dose pack and Tizanidine  - For today of the Medrol  (Methylprednisolone  he can take 2 tablets when he gets it, 2 at supper, and 2 at bedtime) - Avoid NSAIDs (Ibuprofen/Advil/Motrin or Naproxen/Aleve) while on steroid - Tylenol  if okay for breakthrough pain - Heat to area - Epsom salt soak if able to get in and out of bath tub safely - Back exercises and stretches provided via AVS - Seek in person evaluation if worsening or fails to improve with treatment   Follow Up Instructions: I discussed the assessment and treatment plan with the patient. The patient was provided an opportunity to ask questions and all were answered. The patient agreed with the plan and demonstrated an understanding of  the instructions.  A copy of instructions were sent to the patient via MyChart unless otherwise noted below.    The patient was advised to call back or seek an in-person evaluation if the symptoms worsen or if the condition fails to improve as anticipated.    Christian Kelp, PA-C

## 2023-09-04 NOTE — Patient Instructions (Signed)
 Melene Sportsman, thank you for joining Angelia Kelp, PA-C for today's virtual visit.  While this provider is not your primary care provider (PCP), if your PCP is located in our provider database this encounter information will be shared with them immediately following your visit.   A [facility] MyChart account gives you access to today's visit and all your visits, tests, and labs performed at [facility] " click here if you don't have a [facility] MyChart account or go to [email or web address]  Consent: (Patient) Brandy Cal Demmon provided verbal consent for this virtual visit at the beginning of the encounter.  Current Medications:  Current Outpatient Medications:    methylPREDNISolone  (MEDROL  DOSEPAK) 4 MG TBPK tablet, 6 day taper; take as directed on package instructions, Disp: 21 tablet, Rfl: 0   tiZANidine  (ZANAFLEX ) 2 MG tablet, Take 1 tablet (2 mg total) by mouth every 8 (eight) hours as needed for muscle spasms., Disp: 30 tablet, Rfl: 0   dapagliflozin  propanediol (FARXIGA ) 10 MG TABS tablet, TAKE 1 TABLET BY MOUTH EVERY DAY BEFORE BREAKFAST, Disp: 90 tablet, Rfl: 0   ELIQUIS  5 MG TABS tablet, TAKE 1 TABLET BY MOUTH TWICE A DAY, Disp: 60 tablet, Rfl: 11   gabapentin  (NEURONTIN ) 100 MG capsule, Take 1 capsule (100 mg total) by mouth 3 (three) times daily., Disp: 90 capsule, Rfl: 3   hydrochlorothiazide  (HYDRODIURIL ) 25 MG tablet, Take 1 tablet (25 mg total) by mouth daily., Disp: 90 tablet, Rfl: 3   hydrochlorothiazide  (HYDRODIURIL ) 50 MG tablet, TAKE 1 TABLET BY MOUTH EVERY DAY, Disp: 90 tablet, Rfl: 2   losartan  (COZAAR ) 100 MG tablet, Take 1 tablet (100 mg total) by mouth daily., Disp: 90 tablet, Rfl: 1   Multiple Vitamins-Minerals (PRESERVISION AREDS 2 PO), Take 1 capsule by mouth 2 (two) times daily., Disp: , Rfl:    potassium chloride  SA (KLOR-CON  M) 20 MEQ tablet, Take 1 tablet (20 mEq total) by mouth daily., Disp: 90 tablet, Rfl: 3   rosuvastatin  (CRESTOR ) 20  MG tablet, Take 1 tablet (20 mg total) by mouth daily., Disp: 90 tablet, Rfl: 3   Medications ordered in this encounter:  Meds ordered this encounter  Medications   methylPREDNISolone  (MEDROL  DOSEPAK) 4 MG TBPK tablet    Sig: 6 day taper; take as directed on package instructions    Dispense:  21 tablet    Refill:  0    Supervising Provider:   LAMPTEY, PHILIP O [[id number]]   tiZANidine  (ZANAFLEX ) 2 MG tablet    Sig: Take 1 tablet (2 mg total) by mouth every 8 (eight) hours as needed for muscle spasms.    Dispense:  30 tablet    Refill:  0    Supervising Provider:   LAMPTEY, PHILIP O [[id number]]     *If you need refills on other medications prior to your next appointment, please contact your pharmacy*  Follow-Up: Call back or seek an in-person evaluation if the symptoms worsen or if the condition fails to improve as anticipated.   Virtual Care [phone]  Other Instructions  - Suspect back strain causing sciatic pain - Failed NSAIDs - Add Medrol  dose pack and Tizanidine  - For today of the Medrol  (Methylprednisolone  he can take 2 tablets when he gets it, 2 at supper, and 2 at bedtime) - Avoid NSAIDs (Ibuprofen/Advil/Motrin or Naproxen/Aleve) while on steroid - Tylenol  if okay for breakthrough pain - Heat to area - Epsom salt soak if able to get in and out of bath tub  safely - Back exercises and stretches provided via AVS - Seek in person evaluation if worsening or fails to improve with treatment   Back Exercises The following exercises strengthen the muscles that help to support the trunk (torso) and back. They also help to keep the lower back flexible. Doing these exercises can help to prevent or lessen existing low back pain. If you have back pain or discomfort, try doing these exercises 2-3 times each day or as told by your health care provider. As your pain improves, do them once each day, but increase the number of times that you repeat the steps for each  exercise (do more repetitions). To prevent the recurrence of back pain, continue to do these exercises once each day or as told by your health care provider. Do exercises exactly as told by your health care provider and adjust them as directed. It is normal to feel mild stretching, pulling, tightness, or discomfort as you do these exercises, but you should stop right away if you feel sudden pain or your pain gets worse. Exercises Single knee to chest Repeat these steps 3-5 times for each leg: Lie on your back on a firm bed or the floor with your legs extended. Bring one knee to your chest. Your other leg should stay extended and in contact with the floor. Hold your knee in place by grabbing your knee or thigh with both hands and hold. Pull on your knee until you feel a gentle stretch in your lower back or buttocks. Hold the stretch for 10-30 seconds. Slowly release and straighten your leg.  Pelvic tilt Repeat these steps 5-10 times: Lie on your back on a firm bed or the floor with your legs extended. Bend your knees so they are pointing toward the ceiling and your feet are flat on the floor. Tighten your lower abdominal muscles to press your lower back against the floor. This motion will tilt your pelvis so your tailbone points up toward the ceiling instead of pointing to your feet or the floor. With gentle tension and even breathing, hold this position for 5-10 seconds.  Cat-cow Repeat these steps until your lower back becomes more flexible: Get into a hands-and-knees position on a firm bed or the floor. Keep your hands under your shoulders, and keep your knees under your hips. You may place padding under your knees for comfort. Let your head hang down toward your chest. Contract your abdominal muscles and point your tailbone toward the floor so your lower back becomes rounded like the back of a cat. Hold this position for 5 seconds. Slowly lift your head, let your abdominal muscles relax,  and point your tailbone up toward the ceiling so your back forms a sagging arch like the back of a cow. Hold this position for 5 seconds.  Press-ups Repeat these steps 5-10 times: Lie on your abdomen (face-down) on a firm bed or the floor. Place your palms near your head, about shoulder-width apart. Keeping your back as relaxed as possible and keeping your hips on the floor, slowly straighten your arms to raise the top half of your body and lift your shoulders. Do not use your back muscles to raise your upper torso. You may adjust the placement of your hands to make yourself more comfortable. Hold this position for 5 seconds while you keep your back relaxed. Slowly return to lying flat on the floor.  Bridges Repeat these steps 10 times: Lie on your back on a firm bed or  the floor. Bend your knees so they are pointing toward the ceiling and your feet are flat on the floor. Your arms should be flat at your sides, next to your body. Tighten your buttocks muscles and lift your buttocks off the floor until your waist is at almost the same height as your knees. You should feel the muscles working in your buttocks and the back of your thighs. If you do not feel these muscles, slide your feet 1-2 inches (2.5-5 cm) farther away from your buttocks. Hold this position for 3-5 seconds. Slowly lower your hips to the starting position, and allow your buttocks muscles to relax completely. If this exercise is too easy, try doing it with your arms crossed over your chest. Abdominal crunches Repeat these steps 5-10 times: Lie on your back on a firm bed or the floor with your legs extended. Bend your knees so they are pointing toward the ceiling and your feet are flat on the floor. Cross your arms over your chest. Tip your chin slightly toward your chest without bending your neck. Tighten your abdominal muscles and slowly raise your torso high enough to lift your shoulder blades a tiny bit off the floor. Avoid  raising your torso higher than that because it can put too much stress on your lower back and does not help to strengthen your abdominal muscles. Slowly return to your starting position.  Back lifts Repeat these steps 5-10 times: Lie on your abdomen (face-down) with your arms at your sides, and rest your forehead on the floor. Tighten the muscles in your legs and your buttocks. Slowly lift your chest off the floor while you keep your hips pressed to the floor. Keep the back of your head in line with the curve in your back. Your eyes should be looking at the floor. Hold this position for 3-5 seconds. Slowly return to your starting position.  Contact a health care provider if: Your back pain or discomfort gets much worse when you do an exercise. Your worsening back pain or discomfort does not lessen within 2 hours after you exercise. If you have any of these problems, stop doing these exercises right away. Do not do them again unless your health care provider says that you can. Get help right away if: You develop sudden, severe back pain. If this happens, stop doing the exercises right away. Do not do them again unless your health care provider says that you can. This information is not intended to replace advice given to you by your health care provider. Make sure you discuss any questions you have with your health care provider. Document Revised: 06/02/2022 Document Reviewed: 07/12/2020 Elsevier Patient Education  2024 Elsevier Inc.   If you have been instructed to have an in-person evaluation today at a local Urgent Care facility, please use the link below. It will take you to a list of all of our available [facility], including address, phone number and hours of operation. Please do not delay care.  [facility]  If you or a family member do not have a primary care provider, use the link below to schedule a visit and establish care. When you choose a [facility]  primary care physician or advanced practice provider, you gain a long-term partner in health. Find a Primary Care Provider  Learn more about [facility]'s in-office and virtual care options: [facility] - Get Care Now

## 2023-09-04 NOTE — Telephone Encounter (Signed)
 Chief Complaint: Lower back pain x1 month intermittent  Symptoms: Pain radiates down right leg to foot, affects sleep Frequency: Constant now (8/10 pain level) Pertinent Negatives: Patient denies fever, abdomen pain, burning with urination, blood in urine, numbness, weakness  Disposition: [x] Urgent Care (no appt availability in office)   Additional Notes: Pt states he did strenuous work in the yard one week ago. Pt states the lower back pain came back and has gotten worse. Pt states he took gabapentin  and it is not working. Pt scheduled for a virtual urgent care visit today as no appointment availability in office. This RN educated pt on new-worsening symptoms and when to call back/seek emergent care. Pt verbalized understanding and agrees to plan.    Copied from CRM [id number]. Topic: Clinical - Red Word Triage >> Sep 04, 2023  1:22 PM Rosamond Comes wrote: Red Word that prompted transfer to Nurse Triage: patient calling in. Lower back pain radiates down right leg to foot, not sleeping, hurts to sit,stand,lay down, walk Reason for Disposition  [1] SEVERE back pain (e.g., excruciating, unable to do any normal activities) AND [2] not improved 2 hours after pain medicine  Answer Assessment - Initial Assessment Questions Chief Complaint: Lower back pain x1 month intermittent  Symptoms: Pain radiates down right leg to foot, affects sleep Frequency: Constant now (8/10 pain level)  Pertinent Negatives: Patient denies fever, abdomen pain, burning with urination, blood in urine, numbness, weakness  Protocols used: Back Pain-A-AH

## 2023-09-08 ENCOUNTER — Encounter: Payer: Self-pay | Admitting: Student

## 2023-09-09 NOTE — Progress Notes (Unsigned)
   Acute Office Visit  Subjective:     Patient ID: Christian [name], male    DOB: 08-Aug-1945, 78 y.o.   MRN: [id number]  No chief complaint on file.   HPI Patient is in today for ***  Back Pain  He reports {chronicity:119221} back pain. There {injury:31712} an injury that may have caused the pain. The most recent episode started {onset initial:119223} and is {progression:119226}. The pain is located in the {location:31199}{radiating to:11559}. It is described as {quality:31200}, is {severity:119268} in intensity, occurring {frequency of symptoms:119294}. {Symptoms are worse in the:16448:::1}  {Aggravating factors:16449:::1} {Relieving factors:16449:::1}.  He has tried {treatments tried:173219}{improvement:119303}.   Associated symptoms: {Yes/No:20286} abdominal pain {Yes/No:20286} bowel incontinence  {Yes/No:20286} chest pain {Yes/No:20286} dysuria   {Yes/No:20286} fever {Yes/No:20286} headaches  {Yes/No:20286} joint pains {Yes/No:20286} pelvic pain  {Yes/No:20286} weakness in leg  {Yes/No:20286} tingling in lower extremities  {Yes/No:20286} urinary incontinence {Yes/No:20286} weight loss    -----------------------------------------------------------------------------------------   ROS      Objective:    There were no vitals taken for this visit. {Vitals History (Optional):23777}   Physical Exam  No results found for any visits on 09/10/23.      Assessment & Plan:   Problem List Items Addressed This Visit   None   HTN-  BP Readings from Last 3 Encounters:  08/18/23 (!) 142/78  03/07/23 136/78  02/13/23 124/72     No orders of the defined types were placed in this encounter.   No follow-ups on file.  Omri Bertran L Syliva Mee, NP

## 2023-09-10 ENCOUNTER — Ambulatory Visit (INDEPENDENT_AMBULATORY_CARE_PROVIDER_SITE_OTHER): Admitting: Student

## 2023-09-10 ENCOUNTER — Encounter: Payer: Self-pay | Admitting: Student

## 2023-09-10 ENCOUNTER — Ambulatory Visit (HOSPITAL_BASED_OUTPATIENT_CLINIC_OR_DEPARTMENT_OTHER)
Admission: RE | Admit: 2023-09-10 | Discharge: 2023-09-10 | Disposition: A | Discharge status: Home / Self Care | Source: Ambulatory Visit | Attending: Student | Admitting: Student

## 2023-09-10 VITALS — BP 156/82 | HR 74 | Temp 98.2°F | Resp 12 | Ht 68.0 in | Wt 226.2 lb

## 2023-09-10 DIAGNOSIS — M47816 Spondylosis without myelopathy or radiculopathy, lumbar region: Secondary | ICD-10-CM | POA: Diagnosis not present

## 2023-09-10 DIAGNOSIS — M5441 Lumbago with sciatica, right side: Secondary | ICD-10-CM | POA: Diagnosis not present

## 2023-09-10 DIAGNOSIS — M25551 Pain in right hip: Secondary | ICD-10-CM | POA: Diagnosis not present

## 2023-09-10 DIAGNOSIS — M1611 Unilateral primary osteoarthritis, right hip: Secondary | ICD-10-CM | POA: Diagnosis not present

## 2023-09-10 DIAGNOSIS — M545 Low back pain, unspecified: Secondary | ICD-10-CM | POA: Diagnosis not present

## 2023-09-10 MED ORDER — MELOXICAM 15 MG PO TABS
15.0000 mg | ORAL_TABLET | Freq: Every day | ORAL | 0 refills | Status: DC
Start: 2023-09-10 — End: 2023-09-11

## 2023-09-10 NOTE — Patient Instructions (Signed)
 Avoid other NSAIDs while taking meloxicam  Apply ice/ heat to the affected hip for 15-20 minutes as needed, up to 3 times daily.

## 2023-09-10 NOTE — Assessment & Plan Note (Signed)
 Prescribed Meloxicam  15 mg, take one tablet by mouth daily with food for pain and inflammation. Advised to avoid other NSAIDs while taking this med. Apply ice/ heat to the affected hip for 15-20 minutes as needed, up to 3 times daily.

## 2023-09-11 ENCOUNTER — Ambulatory Visit: Payer: Self-pay

## 2023-09-11 ENCOUNTER — Encounter: Payer: Self-pay | Admitting: Family Medicine

## 2023-09-11 MED ORDER — MELOXICAM 7.5 MG PO TABS: 7.5000 mg | ORAL_TABLET | Freq: Every day | ORAL | 0 refills | Status: DC

## 2023-09-11 NOTE — Telephone Encounter (Unsigned)
 Copied from CRM [id number]. Topic: Clinical - Medication Question >> Sep 10, 2023  5:05 PM Alyse July wrote: Reason for CRM: Patient had an appointment today where he was prescribed meloxicam  (MOBIC ) 15 MG tablet. Patient is currently already on a blood thinner ELIQUIS  5 MG TABS tablet. Patient states the pharmacy flagged this medication as  result. Patient would like a call back on clarification [phone].

## 2023-09-11 NOTE — Addendum Note (Signed)
 Addended by: Jackqueline Mason on: 09/11/2023 02:44 PM   Modules accepted: Orders

## 2023-09-11 NOTE — Telephone Encounter (Signed)
 Reason for CRM: Patient had an appointment today where he was prescribed meloxicam  (MOBIC ) 15 MG tablet. Patient is currently already on a blood thinner ELIQUIS  5 MG TABS tablet. Patient states the pharmacy flagged this medication as  result. Patient would like a call back on clarification [phone].

## 2023-09-12 ENCOUNTER — Encounter: Payer: Self-pay | Admitting: Student

## 2023-09-14 ENCOUNTER — Encounter: Payer: Self-pay | Admitting: Student

## 2023-09-15 DIAGNOSIS — M5459 Other low back pain: Secondary | ICD-10-CM | POA: Diagnosis not present

## 2023-09-15 DIAGNOSIS — M545 Low back pain, unspecified: Secondary | ICD-10-CM | POA: Diagnosis not present

## 2023-09-15 DIAGNOSIS — M1611 Unilateral primary osteoarthritis, right hip: Secondary | ICD-10-CM | POA: Diagnosis not present

## 2023-09-16 DIAGNOSIS — M5441 Lumbago with sciatica, right side: Secondary | ICD-10-CM | POA: Diagnosis not present

## 2023-09-17 DIAGNOSIS — M1611 Unilateral primary osteoarthritis, right hip: Secondary | ICD-10-CM | POA: Diagnosis not present

## 2023-09-17 DIAGNOSIS — M5459 Other low back pain: Secondary | ICD-10-CM | POA: Diagnosis not present

## 2023-09-18 ENCOUNTER — Encounter: Payer: Self-pay | Admitting: Student

## 2023-09-19 ENCOUNTER — Telehealth (HOSPITAL_COMMUNITY): Payer: Self-pay

## 2023-09-19 NOTE — Telephone Encounter (Signed)
  [facility]   Pre-operative Risk Assessment       Request for Surgical Clearance    Procedure:  LUMBAR ESI  Date of Surgery:  Clearance 09/23/23                               Surgeon:  Acie Acosta- SURGEON NOT LISTED  Surgeon's Group or Practice Name:  Acie Acosta Phone number:  [phone] Fax number:  [phone] { Type of Clearance Requested:   - Medical  - Pharmacy:  Hold Apixaban  (Eliquis ) NEEDS TO HOLD 3 DAYS PRIOR- IS THIS OKAY  { Type of Anesthesia:  NOT LISTED  { Additional requests/questions:  Please fax a copy of CLEARANCE  to the surgeon's office.  Signed, Rhianon Zabawa B Chrislynn Mosely   09/19/2023, 10:05 AM   [facility]

## 2023-09-22 NOTE — Telephone Encounter (Signed)
 [facility], [place], [place]  You3 days ago    He is at acceptable CV risk for procedure, OK to  hold Eliquis  3 days prior. Resume as soon as safe to do so afterwards.    Clearance faxed to requesting office via Epic Fax Function.

## 2023-09-23 DIAGNOSIS — M5416 Radiculopathy, lumbar region: Secondary | ICD-10-CM | POA: Diagnosis not present

## 2023-10-15 DIAGNOSIS — H35373 Puckering of macula, bilateral: Secondary | ICD-10-CM | POA: Diagnosis not present

## 2023-10-15 DIAGNOSIS — Z961 Presence of intraocular lens: Secondary | ICD-10-CM | POA: Diagnosis not present

## 2023-11-03 DIAGNOSIS — M5459 Other low back pain: Secondary | ICD-10-CM | POA: Diagnosis not present

## 2023-11-04 DIAGNOSIS — M179 Osteoarthritis of knee, unspecified: Secondary | ICD-10-CM | POA: Diagnosis not present

## 2023-11-04 DIAGNOSIS — M545 Low back pain, unspecified: Secondary | ICD-10-CM | POA: Diagnosis not present

## 2023-11-04 DIAGNOSIS — M5440 Lumbago with sciatica, unspecified side: Secondary | ICD-10-CM | POA: Diagnosis not present

## 2023-11-07 ENCOUNTER — Telehealth: Payer: Self-pay

## 2023-11-07 NOTE — Telephone Encounter (Signed)
 Copied from CRM [id number]. Topic: Clinical - Order For Equipment >> Nov 06, 2023  4:20 PM Rilla B wrote: Reason for CRM : Patient need new supplies... Patient states Dr Neysa had his supplies on auto order but he is in need of a new script for [facility].  Any questions, please call.  Please advise pt that he will need a f/u appointment for further supplies as we have not seen him in a year which is why the auto order is no longer working. Please have pt scheduled for the f/u appointment.

## 2023-11-10 DIAGNOSIS — M545 Low back pain, unspecified: Secondary | ICD-10-CM | POA: Diagnosis not present

## 2023-11-10 NOTE — Telephone Encounter (Signed)
 Attempted to call patient. Left voicemail to give our office a call to get scheduled with Dr.Young to continue to get cpap supplies.

## 2023-11-11 ENCOUNTER — Encounter: Payer: Self-pay | Admitting: Internal Medicine

## 2023-11-11 ENCOUNTER — Ambulatory Visit: Admitting: Internal Medicine

## 2023-11-11 VITALS — BP 126/68 | HR 76 | Temp 98.1°F | Ht 68.5 in | Wt 228.2 lb

## 2023-11-11 DIAGNOSIS — G4733 Obstructive sleep apnea (adult) (pediatric): Secondary | ICD-10-CM | POA: Diagnosis not present

## 2023-11-11 NOTE — Patient Instructions (Signed)
 Order- DME [facility]- please replace old CPAP machine, change to auto 5-15, mask of choice, heated humidification, supplies, AirView/ card  Please call if we can help

## 2023-11-11 NOTE — Telephone Encounter (Signed)
 Appt made

## 2023-11-11 NOTE — Progress Notes (Signed)
 11/11/23- 77 yoM followed for OSA, needing CPAP supplies Medical problem list includes ILD( Christian [name]), HTN, Obesity,AFib/ Eliquis , Pulmonary Hypertension, Low Back Pain,  03/29/2000-split-night sleep study-AHI 69.6, CPAP set pressure of 11 shown to have best control  06/29/2003-echocardiogram-LV ejection fraction 55 to 65%, right ventricle was grossly normal LOV for this problem in 2020 CPAP replaced 2020- 12 cwp/ [facility] Discussed the use of AI scribe software for clinical note transcription with the patient, who gave verbal consent to proceed.  History of Present Illness   Christian [name] is a 78 year old male with sleep apnea who presents for evaluation of his CPAP machine and sleep quality.  He has used this CPAP machine for over twelve years, set at a fixed pressure of twelve. Apnea events are well controlled, with events under one per hour, not exceeding one and a half in recent months. He uses the machine nightly and experiences between one and two breakthrough apneas per hour.  He has no shortness of breath and is not on supplemental oxygen. He is followed by a cardiologist and by Christian [name] for fibrosis.  He inquires about the compatibility of a new CPAP machine with his existing masks and hoses, expressing concern about the availability of his current mask model. His sister gave him her hoses, which remain unused.     Assessment and Plan:   Obstructive sleep apnea Sleep apnea Clearly benefits from CPAP with good compliance and control. Chronic sleep apnea managed with CPAP. Current machine outdated. Discussed autopap benefits and equipment compatibility. Emphasized backup machine importance. Insurance may require follow-up in three months. - Order new autopap machine with pressure range 5-15 cm H2O. - Place order for compatible mask and hoses. - Advise to keep existing CPAP machine as a backup. - Schedule follow-up in one year, unless insurance requires earlier  visit.   Back pain -managed by Orthopedics   Pulmonary Fibrosis/ ILD Followed by Christian [name]    ROS-see HPI   += positive Constitutional:    weight loss, night sweats, fevers, chills, fatigue, lassitude. HEENT:    headaches, difficulty swallowing, tooth/dental problems, sore throat,       sneezing, itching, ear ache, nasal congestion, post nasal drip, snoring CV:    chest pain, orthopnea, PND, swelling in lower extremities, anasarca,                                   dizziness, palpitations Resp:   shortness of breath with exertion or at rest.                productive cough,   non-productive cough, coughing up of blood.              change in color of mucus.  wheezing.   Skin:    rash or lesions. GI:  No-   heartburn, indigestion, abdominal pain, nausea, vomiting, diarrhea,                 change in bowel habits, loss of appetite GU: dysuria, change in color of urine, no urgency or frequency.   flank pain. MS:   joint pain, stiffness, decreased range of motion, back pain. Neuro-     nothing unusual Psych:  change in mood or affect.  depression or anxiety.   memory loss.  OBJ- Physical Exam General- Alert, Oriented, Affect-appropriate, Distress- none acute, +overweight Skin- rash-none, lesions- none, excoriation- none Lymphadenopathy- none  Head- atraumatic            Eyes- Gross vision intact, PERRLA, conjunctivae and secretions clear            Ears- Hearing, canals-normal            Nose- Clear, no-Septal dev, mucus, polyps, erosion, perforation             Throat- Mallampati IV , mucosa clear , drainage- none, tonsils- atrophic, +teeth Neck- flexible , trachea midline, no stridor , thyroid  nl, carotid no bruit Chest - symmetrical excursion , unlabored           Heart/CV- RRR , no murmur , no gallop  , no rub, nl s1 s2                           - JVD- none , edema- none, stasis changes- none, varices- none           Lung- +basilar crackle, wheeze- none, cough- none ,  dullness-none, rub- none           Chest wall-  Abd-  Br/ Gen/ Rectal- Not done, not indicated Extrem- cyanosis- none, clubbing, none, atrophy- none, strength- nl Neuro- grossly intact to observation

## 2023-11-13 DIAGNOSIS — M5417 Radiculopathy, lumbosacral region: Secondary | ICD-10-CM | POA: Diagnosis not present

## 2023-11-18 ENCOUNTER — Telehealth: Payer: Self-pay | Admitting: Internal Medicine

## 2023-11-18 DIAGNOSIS — M5416 Radiculopathy, lumbar region: Secondary | ICD-10-CM | POA: Diagnosis not present

## 2023-11-18 NOTE — Telephone Encounter (Signed)
 Per [name] at [facility]-  I am in need of face 2 face OV notes showing usage and benefit of the pap unit. looks like patient has not been in office for a while reg pap.

## 2023-11-19 ENCOUNTER — Telehealth: Payer: Self-pay

## 2023-11-19 ENCOUNTER — Encounter: Payer: Self-pay | Admitting: Internal Medicine

## 2023-11-19 NOTE — Telephone Encounter (Signed)
 Copied from CRM [id number]. Topic: Clinical - Order For Equipment >> Nov 17, 2023 11:02 AM Leila C wrote: Reason for CRM: Patient [phone] states [facility] has not received an order for a new cpap machine. Informed patient, Dr. Neysa 11/11/23 did place the order for cpap machine and it's showing pending. Please advise and call back.   Called patient.  Spoke with [name] New at [facility] earlier today.  [facility] was needing full OV notes from 11/11/2023 visit.  Dr. Neysa completed the note today and I sent Brad a community message in [facility] informing him that the note was ready and they could proceed with the order for patients new CPAP.  Gave patient this information.  Patient should be hearing from [facility] in next few days regarding new CPAP.

## 2023-11-19 NOTE — Telephone Encounter (Signed)
 done

## 2023-11-19 NOTE — Telephone Encounter (Addendum)
 Christian [name] New with [facility] a community message in [facility] notifying that OV note from 11/11/2023 is now ready for [facility] to receive for information.

## 2023-11-19 NOTE — Telephone Encounter (Signed)
 Called [name].  Informed per Dr. Neysa, patient had OV 11/11/2023.  Dr. Neysa is still working on dictated note for this visit.  [name] is asking that Dr. Neysa put in note that patient is using the CPAP and that he is benefiting from the CPAP use.  Dr. Neysa,  please advise.  Will notify Brad New at [facility] once your note is complete.

## 2023-11-23 ENCOUNTER — Other Ambulatory Visit (HOSPITAL_COMMUNITY): Payer: Self-pay | Admitting: Cardiology

## 2023-11-26 ENCOUNTER — Other Ambulatory Visit (HOSPITAL_COMMUNITY): Payer: Self-pay | Admitting: Cardiology

## 2023-12-04 DIAGNOSIS — G4733 Obstructive sleep apnea (adult) (pediatric): Secondary | ICD-10-CM | POA: Diagnosis not present

## 2023-12-05 ENCOUNTER — Ambulatory Visit (HOSPITAL_COMMUNITY)
Admission: RE | Admit: 2023-12-05 | Discharge: 2023-12-05 | Disposition: A | Discharge status: Home / Self Care | Source: Ambulatory Visit | Attending: Cardiology | Admitting: Cardiology

## 2023-12-05 ENCOUNTER — Encounter (HOSPITAL_COMMUNITY): Payer: Self-pay | Admitting: Cardiology

## 2023-12-05 VITALS — BP 152/82 | HR 95 | Ht 68.5 in | Wt 230.0 lb

## 2023-12-05 DIAGNOSIS — I2510 Atherosclerotic heart disease of native coronary artery without angina pectoris: Secondary | ICD-10-CM | POA: Insufficient documentation

## 2023-12-05 DIAGNOSIS — G4733 Obstructive sleep apnea (adult) (pediatric): Secondary | ICD-10-CM | POA: Insufficient documentation

## 2023-12-05 DIAGNOSIS — I4891 Unspecified atrial fibrillation: Secondary | ICD-10-CM | POA: Insufficient documentation

## 2023-12-05 DIAGNOSIS — Z7901 Long term (current) use of anticoagulants: Secondary | ICD-10-CM | POA: Diagnosis not present

## 2023-12-05 DIAGNOSIS — J849 Interstitial pulmonary disease, unspecified: Secondary | ICD-10-CM | POA: Insufficient documentation

## 2023-12-05 DIAGNOSIS — I110 Hypertensive heart disease with heart failure: Secondary | ICD-10-CM | POA: Insufficient documentation

## 2023-12-05 DIAGNOSIS — I5032 Chronic diastolic (congestive) heart failure: Secondary | ICD-10-CM | POA: Insufficient documentation

## 2023-12-05 MED ORDER — ROSUVASTATIN CALCIUM 40 MG PO TABS: 40.0000 mg | ORAL_TABLET | Freq: Every day | ORAL | 3 refills | Status: AC

## 2023-12-05 NOTE — Patient Instructions (Signed)
 INCREASE Crestor  to 40 mg daily.  Blood work in 2 months.  Please call Dr. Reeves office to arrange follow up appointment.  Check your blood pressure daily for 2 weeks then send results in to Dr. Rolan.  Your physician recommends that you schedule a follow-up appointment in: 1 year ( July 2026) ** PLEASE CALL THE OFFICE IN MAY 2026 TO ARRANGE YOUR FOLLOW UP APPOINTMENT.**  If you have any questions or concerns before your next appointment please send us  a message through [facility] or call our office at [phone].    TO LEAVE A MESSAGE FOR THE NURSE SELECT OPTION 2, PLEASE LEAVE A MESSAGE INCLUDING: YOUR NAME DATE OF BIRTH CALL BACK NUMBER REASON FOR CALL**this is important as we prioritize the call backs  YOU WILL RECEIVE A CALL BACK THE SAME DAY AS LONG AS YOU CALL BEFORE 4:00 PM  At the [facility], you and your health needs are our priority. As part of our continuing mission to provide you with exceptional heart care, we have created designated Provider Care Teams. These Care Teams include your primary Cardiologist (physician) and Advanced Practice Providers (APPs- Physician Assistants and Nurse Practitioners) who all work together to provide you with the care you need, when you need it.   You may see any of the following providers on your designated Care Team at your next follow up: Dr Toribio Fuel Dr Ezra Rolan Dr. Ria Commander Dr. Morene Brownie Amy Lenetta, NP Caffie Shed, [place] [facility] [place], [place] Beckey Coe, NP [place] Lee, NP Ellouise Class, NP Tinnie Redman, PharmD Jaun Bash, PharmD   Please be sure to bring in all your medications bottles to every appointment.    Thank you for choosing [facility]

## 2023-12-07 NOTE — Progress Notes (Signed)
 [facility] Note   PCP: Domenica Harlene LABOR, MD Pulmonology: Dr. Geronimo HF Cardiology: Dr. Rolan  Chief complaint: CHF  78 y.o. with history of HTN and OSA was found to have interstitial lung disease and was referred for workup of possible pulmonary hypertension by Dr. Geronimo.  Patient was generally doing well with minimal medical problems until 12/22.  At that time, he was hospitalized with chest pain.  Coronary CT was done during 12/22 admission showing nonobstructive CAD, but interstitial lung disease was incidentally noted on the CT.  Patient had an echo showing EF 60-65%, mild LVH, mildly decreased RV systolic function, PASP 38 mmHg, IVC dilated.  He was referred to Dr. Geronimo for further workup of ILD, high resolution CT chest showed ILD with NSIP pattern.  Autoimmune serologies were weakly positive for ANA and P-ANCA. He was referred here due to at least mild pulmonary hypertension by echo with mildly decreased RV function.   RHC was done in 7/23 showing mildly elevated PCWP, normal RA pressure, very mild pulmonary venous hypertension likely from diastolic CHF.   Patient has not started anti-fibrotic meds at this point, plan for watchful waiting.   He was seen by Dr. Geronimo 08/27/22 and was noted to be tachycardic. EKG was obtained and showed possible atrial fibrillation w/ CVC, no discernable p-waves. Subsequently sent back to [facility] for further assessment.  Follow up 4/24, found to be in new atrial fibrillation. He was started on Eliquis  (ASA stopped) and arranged for DCCV after 4 weeks of AC. Underwent DCCV 09/30/22 to NSR.   Today he returns for HF follow up.  He remains in NSR.  No palpitations.  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  He has been less active because of this but is starting to exercise more.  No exertional dyspnea or chest pain.  BP elevated today but generally < 130  systolic when he checks at home.  Weight is up 11 lbs, he attributes this to less exercise with sciatica. He is using CPAP regularly.   ECG (personally reviewed): NSR, 1st degree AVB, nonspecific T wave flattening  Labs (12/22): K 4, creatinine 0.78, LDL 109 Labs (7/23): LDL 70 Labs (8/23): K 3.8, creatinine 0.86 Labs (3/24): LDL 72, K 4.1, creatinine 0.9 Labs (4/24): K 3.9, creatinine 0.88 Labs (4/25): LDL 72, TGs 115, K 3.6, creatinine 0.88  PMH: 1. OSA: CPAP 2. HTN 3. Hyperlipidemia 4. Ascending aortic aneurysm: 4.2 cm ascending aorta on 12/22 CT chest.  5. Interstitial lung disease: This was noted on high resolution CT chest in 1/23.  NSIP pattern.   - RF-, SCL-70-, SSA/B-, ESR 51, ANA 1:80, P-ANCA 1:40 - Echo (12/22): EF 60-65%, mild LVH, mildly decreased RV systolic function, PASP 38 mmHg, IVC dilated.  - Echo (7/24): EF 55-60%, normal RV, unable to estimate PASP.  6. CAD: Coronary CTA (12/22) with calcium  score 848 Agatston units (74th percentile), mild nonobstructive CAD. 7. Chronic diastolic CHF: RHC (7/23) with mean RA 6, PA 36/16 mean 25, mean PCWP 17, CI 3.68, PVR 1 WU 8. Atrial fibrillation: Newly diagnosed 4/24. - s/p DCCV (09/30/22)  Social History   Socioeconomic History   Marital status: Married    Spouse name: Not on file   Number of children: 1   Years of education: Not on file   Highest education level: Some college, no degree  Occupational History   Occupation: Retired  Tobacco Use   Smoking status: Never   Smokeless tobacco: Never  Substance and Sexual Activity   Alcohol use: Yes    Alcohol/week: 0.0 standard drinks of alcohol    Comment: 2-3 glasses of wine every night   Drug use: No   Sexual activity: Yes    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, retired from English as a second language teacher, no dietary restrictions  Other Topics Concern   Not on file  Social History Narrative   Not on file   Social Drivers of Health   Financial Resource Strain: Low Risk   (09/04/2023)   Overall Financial Resource Strain (CARDIA)    Difficulty of Paying Living Expenses: Not hard at all  Food Insecurity: No Food Insecurity (09/04/2023)   Hunger Vital Sign    Worried About Running Out of Food in the Last Year: Never true    Ran Out of Food in the Last Year: Never true  Transportation Needs: No Transportation Needs (09/04/2023)   PRAPARE - Administrator, Civil Service (Medical): No    Lack of Transportation (Non-Medical): No  Physical Activity: Insufficiently Active (09/04/2023)   Exercise Vital Sign    Days of Exercise per Week: 7 days    Minutes of Exercise per Session: 10 min  Stress: No Stress Concern Present (09/04/2023)   [employer] of Occupational Health - Occupational Stress Questionnaire    Feeling of Stress : Not at all  Social Connections: Moderately Isolated (09/04/2023)   Social Connection and Isolation Panel    Frequency of Communication with Friends and Family: More than three times a week    Frequency of Social Gatherings with Friends and Family: More than three times a week    Attends Religious Services: Never    Database administrator or Organizations: No    Attends Engineer, structural: Not on file    Marital Status: Married  Catering manager Violence: Not on file   Family History  Problem Relation Age of Onset   Stroke Father        from surgery   Emphysema Father        smoker   Heart disease Father        carotid artery disease, vasculopathy   Allergic Disorder Sister    ROS: All systems reviewed and negative except as per HPI.   Current Outpatient Medications  Medication Sig Dispense Refill   dapagliflozin  propanediol (FARXIGA ) 10 MG TABS tablet TAKE 1 TABLET BY MOUTH EVERY DAY BEFORE BREAKFAST 90 tablet 3   ELIQUIS  5 MG TABS tablet TAKE 1 TABLET BY MOUTH TWICE A DAY 60 tablet 11   hydrochlorothiazide  (HYDRODIURIL ) 25 MG tablet Take 1 tablet (25 mg total) by mouth daily. 90 tablet 3   losartan   (COZAAR ) 100 MG tablet Take 1 tablet (100 mg total) by mouth daily. 90 tablet 1   Multiple Vitamins-Minerals (PRESERVISION AREDS 2 PO) Take 1 capsule by mouth 2 (two) times daily.     potassium chloride  SA (KLOR-CON  M) 20 MEQ tablet Take 1 tablet (20 mEq total) by mouth daily. 90 tablet 3   rosuvastatin  (CRESTOR ) 40 MG tablet Take 1 tablet (40 mg total) by mouth daily. 90 tablet 3   No current facility-administered medications for this encounter.   Wt Readings from Last 3 Encounters:  12/05/23 104.3 kg (230 lb)  11/11/23 103.5 kg (228 lb 3.2 oz)  09/10/23 102.6 kg (226 lb 3.2 oz)   BP (!) 152/82   Pulse 95  Ht 5' 8.5 (1.74 m)   Wt 104.3 kg (230 lb)   SpO2 95%   BMI 34.46 kg/m   PHYSICAL EXAM: General: NAD Neck: No JVD, no thyromegaly or thyroid  nodule.  Lungs: Clear to auscultation bilaterally with normal respiratory effort. CV: Nondisplaced PMI.  Heart regular S1/S2, no S3/S4, no murmur.  Trace ankle edema.  No carotid bruit.  Normal pedal pulses.  Abdomen: Soft, nontender, no hepatosplenomegaly, no distention.  Skin: Intact without lesions or rashes.  Neurologic: Alert and oriented x 3.  Psych: Normal affect. Extremities: No clubbing or cyanosis.  HEENT: Normal.   Assessment/Plan: 1. Atrial Fibrillation:. S/p DCCV 5/24 to NSR. He is in NSR today.  No palpitations, no documented recurrence since 5/24.  - Continue Eliquis  5 mg bid.  - Continue to limit ETOH intake.  - Continue CPAP nightly.  2. Interstitial lung disease: Noted by coronary CTA and confirmed on HRCT in 1/23.  NSIP pattern, weakly positive ANA and P-ANCA.  Has seen Dr. Geronimo, plan for now is watchful waiting (have not started anti-fibrotic meds).   - Needs followup with Dr. Geronimo.  3. Coronary artery disease: Hospitalized with noncardiac chest pain in 12/22.  Coronary CT with calcium  score 848 Agatston units and mild nonobstructive CAD.  - No longer on ASA with need for AC. - Would like to see LDL <  55, increase Crestor  to 40 mg daily with lipids/LFTs in 2 months.   4. OSA: Continue CPAP.  5. HTN: BP elevated in office today but he says SBP < 130 when he checks at home.  - Check BP at home daily after meds x 2 wks and send in readings to me.  - Continue losartan  100 mg daily.  - Continue hydrochlorothiazide  25 mg daily.  6. Chronic diastolic CHF: Echo in 12/22 showed mild pulmonary hypertension (PASP 38 mmHg) with mild RV dilation. Patient had RHC in 7/23 which showed mildly elevated PCWP and PA pressure likely due to diastolic CHF (mild).  Echo in 7/24 with EF 55-60%, normal RV.  NYHA I-II, he is not volume overloaded on exam. Weight is up but think this is caloric as he has been more sedentary with sciatica.  - Continue Farxiga  10 mg daily.  Follow up 1 year  I spent 31 minutes reviewing records, interviewing/examining patient, and managing orders.     Ezra Shuck,  12/07/2023

## 2023-12-10 ENCOUNTER — Other Ambulatory Visit: Payer: Self-pay | Admitting: Family Medicine

## 2023-12-15 ENCOUNTER — Other Ambulatory Visit: Payer: Self-pay

## 2023-12-15 ENCOUNTER — Ambulatory Visit (HOSPITAL_BASED_OUTPATIENT_CLINIC_OR_DEPARTMENT_OTHER): Attending: Specialist | Admitting: Physical Therapy

## 2023-12-15 ENCOUNTER — Encounter (HOSPITAL_BASED_OUTPATIENT_CLINIC_OR_DEPARTMENT_OTHER): Payer: Self-pay | Admitting: Physical Therapy

## 2023-12-15 DIAGNOSIS — M545 Low back pain, unspecified: Secondary | ICD-10-CM | POA: Insufficient documentation

## 2023-12-15 DIAGNOSIS — M6281 Muscle weakness (generalized): Secondary | ICD-10-CM | POA: Insufficient documentation

## 2023-12-15 NOTE — Therapy (Signed)
 OUTPATIENT PHYSICAL THERAPY THORACOLUMBAR EVALUATION   Patient Name: Christian [name] MRN: [id number] DOB:08-15-1945, 78 y.o., male Today's Date: 12/15/2023  END OF SESSION:  PT End of Session - 12/15/23 1421     Visit Number 1    Number of Visits 12    Date for PT Re-Evaluation 03/14/24    Authorization Type HTA    PT Start Time 1401    PT Stop Time 1445    PT Time Calculation (min) 44 min    Activity Tolerance Patient tolerated treatment well    Behavior During Therapy [facility] for tasks assessed/performed           Past Medical History:  Diagnosis Date   Back pain 06/04/2015   DVT of lower limb, acute (HCC) 01/13/2015   High blood pressure    High cholesterol    History of chicken pox 01/13/2015   Hyperlipidemia, mild 01/13/2015   Hypokalemia 01/13/2015   Obesity    Verrucous skin lesion 02/08/2016   Past Surgical History:  Procedure Laterality Date   CARDIOVERSION N/A 09/30/2022   Procedure: CARDIOVERSION;  Surgeon: Rolan Ezra RAMAN, MD;  Location: [facility] INVASIVE CV LAB;  Service: Cardiovascular;  Laterality: N/A;   EYE SURGERY Bilateral 02/2019   Dr Lavonia   RIGHT HEART CATH N/A 12/04/2021   Procedure: RIGHT HEART CATH;  Surgeon: Rolan Ezra RAMAN, MD;  Location: [place] INVASIVE CV LAB;  Service: Cardiovascular;  Laterality: N/A;   Patient Active Problem List   Diagnosis Date Noted   Atrial fibrillation (HCC) 09/03/2023   Seborrheic dermatitis 04/27/2021   Coronary artery disease involving native heart without angina pectoris 04/27/2021   Osteoarthritis of knee 04/27/2021   Chest pressure 04/23/2021   Right hip pain 03/04/2021   Cataract 11/20/2017   Right shoulder pain 05/20/2017   Diverticulosis 05/20/2017   Preventative health care 05/20/2017   Verrucous skin lesion 02/08/2016   Low back pain 06/04/2015   Nonallergic rhinitis 02/10/2015   Sun-damaged skin 01/29/2015   Benign essential HTN 01/29/2015   Hyperglycemia 01/29/2015   Hyperlipidemia, mild 01/13/2015   History of  chicken pox 01/13/2015   Hypokalemia 01/13/2015   Obesity    Obstructive sleep apnea 06/10/2014   Hypertension 04/19/2013    PCP: Domenica Harlene DELENA, MD   REFERRING PROVIDER:  Duwayne Purchase, MD       REFERRING DIAG:  M54.50 (ICD-10-CM) - Low back pain, unspecified      Rationale for Evaluation and Treatment: Rehabilitation  THERAPY DIAG:  Pain, lumbar region  Muscle weakness (generalized)  ONSET DATE: Spring 2025  SUBJECTIVE:  SUBJECTIVE STATEMENT: Pt reports pain/radiating starting a few months ago after working out in the garden. Pt started having pain coming down the leg and into the foot. MRI shows a bulging disc. Pt received a spinal injection that was mildly helpful. Due to time since onset of injury, the pain was gone and the shot did not change is concerns of drop foot. Pt has R foot  weakness and surgeon does not think surgery is indicated. Has NCV test done that shows fairly normal conduction. 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 Pt does have difficulty with losing balance/catching his toe. Initially balance was bad but it has gotten about 80% better. Pt is cautious with standing tasks. Pt does not stiffness in the morning but goes away once he comes up into standing. Pt does have NT into the feet/toes with pain into top of the foot. Pt is has fear of falling now.   PERTINENT HISTORY:  HLD, HTN, skin cancer, knee OA  PAIN:  Are you having pain? Yes: NPRS scale: 2-3/10 at worst Pain location: R sided LBP Pain description: aching, sharp intially  Aggravating factors: rotation, sitting and then transition  Relieving factors: gabapentin , nothing else helps pain   PRECAUTIONS: None  RED FLAGS: None   WEIGHT BEARING RESTRICTIONS: No  FALLS:  Has patient fallen in last 6  months? No  LIVING ENVIRONMENT: Lives with: lives with their family and lives with their spouse Lives in: House/apartment Stairs: 3 steps to change floors  Has following equipment at home: None  OCCUPATION: retired  Recreation: walking, walking dogs   PLOF: Independent  PATIENT GOALS: reduce pain and return to normal activity  OBJECTIVE:  Note: Objective measures were completed at Evaluation unless otherwise noted.  DIAGNOSTIC FINDINGS:  IMPRESSION: 1. No acute osseous abnormality. 2. Moderate osteoarthritis of the right hip.     Electronically Signed   By: Harrietta Sherry M.D.   On: 09/17/2023 14:04  PATIENT SURVEYS:   Modified Oswestry Low Back Pain Disability Questionnaire: 10 / 50 = 20.0 %   SCREENING FOR RED FLAGS: Bowel or bladder incontinence: No Spinal tumors: No Cauda equina syndrome: No Compression fracture: No Abdominal aneurysm: No  COGNITION: Overall cognitive status: Within functional limits for tasks assessed     SENSATION: WFL  MUSCLE LENGTH: Hamstrings: positive supine 90/90 Positive Ely's LUMBAR ROM:   AROM eval  Flexion 75%  Extension 50%  Right lateral flexion 75%  Left lateral flexion 75%  Right rotation 75%  Left rotation 75%   (Blank rows = not tested)  LOWER EXTREMITY ROM:   none painful  Active  Right eval Left eval  Hip flexion 100 100  Hip extension -5 -5  Hip abduction    Hip adduction    Hip internal rotation 25 25  Hip external rotation 30 30  Knee flexion    Knee extension    Ankle dorsiflexion    Ankle plantarflexion    Ankle inversion    Ankle eversion     (Blank rows = not tested)  LOWER EXTREMITY MMT:  none painful  MMT Right eval Left eval  Hip flexion 4+/5 4+/5  Hip extension    Hip abduction 4+/5 3+/5  Hip adduction 4+/5 4+/5  Hip internal rotation    Hip external rotation    Knee flexion    Knee extension 5/5 5/5  Ankle dorsiflexion 5/5 1/5  Ankle plantarflexion    Ankle inversion     Ankle eversion     (  Blank rows = not tested)  Unable to perform great toe extension or toe abduction L4-5 myotomes with myopathic weakness  LUMBAR SPECIAL TESTS:  Slump test: positive SLR: positive GAIT: Distance walked: 27ft Assistive device utilized: None Level of assistance: Complete Independence Comments: decreased bilat step length, 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, ankle eversion/toe out compensation for foot clearance  TODAY'S TREATMENT:                                                                                                                              DATE:   Exercises - Assisted Dorsiflexion  - 3 x daily - 7 x weekly - 1 sets - 20 reps - Supine Sciatic Nerve Glide  - 3 x daily - 7 x weekly - 1 sets - 20 reps - Heel Raises with Counter Support  - 3 x daily - 7 x weekly - 1 sets - 20 reps    PATIENT EDUCATION:  Education details:  MOI, diagnosis, prognosis, anatomy, exercise progression, DOMS expectations, muscle firing,  envelope of function, HEP, POC  Person educated: Patient Education method: Explanation, Demonstration, Tactile cues, Verbal cues,  Education comprehension: verbalized understanding, returned demonstration, and verbal cues required     HOME EXERCISE PROGRAM:    Access Code: [email or web address] URL: [email or web address] Date: 12/15/2023 Prepared by: Dale Call            ASSESSMENT:   CLINICAL IMPRESSION:   Patient is a 78  y.o. male who was seen today for physical therapy evaluation and treatment for c/c of LBP. 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 Pt does have L4-5 weakness and limited DF strength due to history of disc herniation. Pt does have signs of drop foot which poses an increased risk for falls. Pt only with trace muscle activation of tibialis anterior and toe extensors. Pt would greatly benefit from NMES assist with DF and toe extension. Edu provided on home NMES unit and AAROM exercise with focused intent to move into ankle  and toe ext regardless of lack of AROM. Plan to continue with progressive strength, balance, and gait training as tolerated. Pt currently has ankle DF assist brace recommended by MD (not true AFO). Pt advised on using brace when outdoors and around uneven surfaces but may walk without inside his home as he reports clear floor and levels surfaces. Pt would benefit from continued skilled therapy in order to reach goals and maximize functional lumbopelvic strength and ROM for return to normal mobility and ADL.    OBJECTIVE IMPAIRMENTS Abnormal gait, decreased activity tolerance, decreased endurance, decreased mobility, difficulty walking, decreased ROM, decreased strength, hypomobility, increased muscle spasms, impaired flexibility, improper body mechanics, postural dysfunction, and pain.    ACTIVITY LIMITATIONS carrying, lifting, bending, sitting, standing, squatting, sleeping, stairs, transfers, and locomotion level   PARTICIPATION LIMITATIONS: cleaning, laundry, interpersonal relationship, driving, shopping, community activity, occupation, and exercise   PERSONAL FACTORS Age, Fitness, Time since onset of injury/illness/exacerbation, and 1-2 comorbidities:  are also affecting patient's functional outcome.          GOALS:     SHORT TERM GOALS: Target date: 01/26/2024        Pt will become independent with HEP in order to demonstrate synthesis of PT education.     Goal status: INITIAL   2.  Pt will report at least 2 pt reduction on NPRS scale for pain at worst in order to demonstrate functional improvement with household activity, self care, and ADL.    Goal status: INITIAL       LONG TERM GOALS: Target date: 06/30/2023        Pt  will become independent with final HEP in order to demonstrate synthesis of PT education.    Goal status: INITIAL   2. Pt will demonstrate at least a 12.8 improvement in Oswestry Index in order to demonstrate a clinically significant change in LBP and  function.   Goal status: INITIAL   3.  Pt will be able to demonstrate/report ability to walk >15 mins without pain or LOB in order to demonstrate functional improvement and tolerance to exercise and community mobility.    Goal status: INITIAL   4.  Pt will be able to demonstrate/report ability to bend, lift, mop without pain in order to demonstrate functional improvement and tolerance to exercise and community mobility.    Goal status: INITIAL  5. Pt will be able to demonstrate antigravity ankle DF in order to demonstrate functional improvement in LE function for community ambulation and reduced risk of falls.   Goal status: INITIAL     PLAN:   PLAN: PT FREQUENCY: 1-2x/week   PT DURATION: 12 weeks (plan to D/C in 8 wks)    PLANNED INTERVENTIONS: Therapeutic exercises, Therapeutic activity, Neuromuscular re-education, Balance training, Gait training, Patient/Family education, Joint manipulation, Joint mobilization, Stair training, Orthotic/Fit training, DME instructions, Aquatic Therapy, Dry Needling, Electrical stimulation, Spinal manipulation, Spinal mobilization, Cryotherapy, Moist heat, scar mobilization, Splintting, Taping, Vasopneumatic device, Traction, Ultrasound, Ionotophoresis 4mg /ml Dexamethasone , Manual therapy, and Re-evaluation.   PLAN FOR NEXT SESSION: NMES ASSIST for ankle DF*, AAROM ankle motion, standing balance, review of nerve gliding, generalized R LE strength  Dale Call, PT 12/15/2023, 4:14 PM

## 2023-12-23 DIAGNOSIS — M5416 Radiculopathy, lumbar region: Secondary | ICD-10-CM | POA: Diagnosis not present

## 2023-12-24 ENCOUNTER — Ambulatory Visit (HOSPITAL_BASED_OUTPATIENT_CLINIC_OR_DEPARTMENT_OTHER): Admitting: Physical Therapy

## 2023-12-24 DIAGNOSIS — M6281 Muscle weakness (generalized): Secondary | ICD-10-CM

## 2023-12-24 DIAGNOSIS — M545 Low back pain, unspecified: Secondary | ICD-10-CM | POA: Diagnosis not present

## 2023-12-24 NOTE — Therapy (Signed)
 OUTPATIENT PHYSICAL THERAPY THORACOLUMBAR EVALUATION   Patient Name: Christian [name] MRN: [id number] DOB:[birth year], 78 y.o., male Today's Date: 12/24/2023  END OF SESSION:  PT End of Session - 12/24/23 0940     Visit Number 2    Number of Visits 12    Date for PT Re-Evaluation 03/14/24    Authorization Type HTA    PT Start Time 0846    PT Stop Time 0928    PT Time Calculation (min) 42 min    Activity Tolerance Patient tolerated treatment well    Behavior During Therapy [facility] for tasks assessed/performed            Past Medical History:  Diagnosis Date   Back pain 06/04/2015   DVT of lower limb, acute (HCC) 01/13/2015   High blood pressure    High cholesterol    History of chicken pox 01/13/2015   Hyperlipidemia, mild 01/13/2015   Hypokalemia 01/13/2015   Obesity    Verrucous skin lesion 02/08/2016   Past Surgical History:  Procedure Laterality Date   CARDIOVERSION N/A 09/30/2022   Procedure: CARDIOVERSION;  Surgeon: Rolan Ezra RAMAN, MD;  Location: [facility] INVASIVE CV LAB;  Service: Cardiovascular;  Laterality: N/A;   EYE SURGERY Bilateral 02/2019   Dr Lavonia   RIGHT HEART CATH N/A 12/04/2021   Procedure: RIGHT HEART CATH;  Surgeon: Rolan Ezra RAMAN, MD;  Location: [facility] INVASIVE CV LAB;  Service: Cardiovascular;  Laterality: N/A;   Patient Active Problem List   Diagnosis Date Noted   Atrial fibrillation (HCC) 09/03/2023   Seborrheic dermatitis 04/27/2021   Coronary artery disease involving native heart without angina pectoris 04/27/2021   Osteoarthritis of knee 04/27/2021   Chest pressure 04/23/2021   Right hip pain 03/04/2021   Cataract 11/20/2017   Right shoulder pain 05/20/2017   Diverticulosis 05/20/2017   Preventative health care 05/20/2017   Verrucous skin lesion 02/08/2016   Low back pain 06/04/2015   Nonallergic rhinitis 02/10/2015   Sun-damaged skin 01/29/2015   Benign essential HTN 01/29/2015   Hyperglycemia 01/29/2015   Hyperlipidemia, mild 01/13/2015   History  of chicken pox 01/13/2015   Hypokalemia 01/13/2015   Obesity    Obstructive sleep apnea 06/10/2014   Hypertension 04/19/2013    PCP: Domenica Harlene DELENA, MD   REFERRING PROVIDER:  Duwayne Purchase, MD       REFERRING DIAG:  M54.50 (ICD-10-CM) - Low back pain, unspecified      Rationale for Evaluation and Treatment: Rehabilitation  THERAPY DIAG:  Pain, lumbar region  Muscle weakness (generalized)  ONSET DATE: Spring 2025  SUBJECTIVE:  SUBJECTIVE STATEMENT:  Pt reports seeing MD since previous visit and is was advised to continue with PT. Pt has not had any instances of catching of the foot. He is doing HEP and maybe getting mild DF and is walking easily. Pain into the back is minimal but has had toe tingling in the middle of the night.   Eval:  Pt reports pain/radiating starting a few months ago after working out in the garden. Pt started having pain coming down the leg and into the foot. MRI shows a bulging disc. Pt received a spinal injection that was mildly helpful. Due to time since onset of injury, the pain was gone and the shot did not change is concerns of drop foot. Pt has R foot  weakness and surgeon does not think surgery is indicated. Has NCV test done that shows fairly normal conduction. 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 Pt does have difficulty with losing balance/catching his toe. Initially balance was bad but it has gotten about 80% better. Pt is cautious with standing tasks. Pt does not stiffness in the morning but goes away once he comes up into standing. Pt does have NT into the feet/toes with pain into top of the foot. Pt is has fear of falling now.   PERTINENT HISTORY:  HLD, HTN, skin cancer, knee OA  PAIN:  Are you having pain? no: NPRS scale: 2-3/10 at worst Pain  location: R sided LBP Pain description: aching, sharp intially  Aggravating factors: rotation, sitting and then transition  Relieving factors: gabapentin , nothing else helps pain   PRECAUTIONS: None  RED FLAGS: None   WEIGHT BEARING RESTRICTIONS: No  FALLS:  Has patient fallen in last 6 months? No  LIVING ENVIRONMENT: Lives with: lives with their family and lives with their spouse Lives in: House/apartment Stairs: 3 steps to change floors  Has following equipment at home: None  OCCUPATION: retired  Recreation: walking, walking dogs   PLOF: Independent  PATIENT GOALS: reduce pain and return to normal activity  OBJECTIVE:  Note: Objective measures were completed at Evaluation unless otherwise noted.  DIAGNOSTIC FINDINGS:  IMPRESSION: 1. No acute osseous abnormality. 2. Moderate osteoarthritis of the right hip.     Electronically Signed   By: Harrietta Sherry M.D.   On: 09/17/2023 14:04  PATIENT SURVEYS:   Modified Oswestry Low Back Pain Disability Questionnaire: 10 / 50 = 20.0 %   SCREENING FOR RED FLAGS: Bowel or bladder incontinence: No Spinal tumors: No Cauda equina syndrome: No Compression fracture: No Abdominal aneurysm: No  COGNITION: Overall cognitive status: Within functional limits for tasks assessed     SENSATION: WFL  MUSCLE LENGTH: Hamstrings: positive supine 90/90 Positive Ely's LUMBAR ROM:   AROM eval  Flexion 75%  Extension 50%  Right lateral flexion 75%  Left lateral flexion 75%  Right rotation 75%  Left rotation 75%   (Blank rows = not tested)  LOWER EXTREMITY ROM:   none painful  Active  Right eval Left eval  Hip flexion 100 100  Hip extension -5 -5  Hip abduction    Hip adduction    Hip internal rotation 25 25  Hip external rotation 30 30  Knee flexion    Knee extension    Ankle dorsiflexion    Ankle plantarflexion    Ankle inversion    Ankle eversion     (Blank rows = not tested)  LOWER EXTREMITY MMT:  none  painful  MMT Right eval Left eval  Hip flexion 4+/5 4+/5  Hip extension    Hip abduction 4+/5 3+/5  Hip adduction 4+/5 4+/5  Hip internal rotation    Hip external rotation    Knee flexion    Knee extension 5/5 5/5  Ankle dorsiflexion 5/5 1/5  Ankle plantarflexion    Ankle inversion    Ankle eversion     (Blank rows = not tested)  Unable to perform great toe extension or toe abduction L4-5 myotomes with myopathic weakness  LUMBAR SPECIAL TESTS:  Slump test: positive SLR: positive GAIT: Distance walked: 69ft Assistive device utilized: None Level of assistance: Complete Independence Comments: decreased bilat step length, 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, ankle eversion/toe out compensation for foot clearance  TODAY'S TREATMENT:                                                                                                                              DATE:   8/13 [place] Estim R ankle DF: 10/30 on off, 5s ramp, 80 bps, 50% duty, 58 mA 15 min  Assisted DF 10x reps Tandem balance on airex  30s 3x  Self NMES unit set up, pad placement, safety    Eval:   Exercises - Assisted Dorsiflexion  - 3 x daily - 7 x weekly - 1 sets - 20 reps - Supine Sciatic Nerve Glide  - 3 x daily - 7 x weekly - 1 sets - 20 reps - Heel Raises with Counter Support  - 3 x daily - 7 x weekly - 1 sets - 20 reps    PATIENT EDUCATION:  Education details:  MOI, diagnosis, prognosis, anatomy, exercise progression, DOMS expectations, muscle firing,  envelope of function, HEP, POC  Person educated: Patient Education method: Explanation, Demonstration, Tactile cues, Verbal cues,  Education comprehension: verbalized understanding, returned demonstration, and verbal cues required     HOME EXERCISE PROGRAM:    Access Code: [email or web address] URL: [email or web address] Date: 12/15/2023 Prepared by: Dale Call            ASSESSMENT:   CLINICAL IMPRESSION:   Pt session used to set up self home Estim unit.  Parameters set in place for [place] ESTIM NMES. However, unit does not appear to have enough amperage to facilitate full contraction. Parameters provided to pt so that he may continue trying the settings and modes of unit. With in clinic machine, pt was able to get mild contraction though still requires over 58 mA to get light contraction of tibialis anterior being stinging sensation. Ankle stability and balance exercise provided in HEP today. Plan to continue with clinic NMES unit for muscle facilitation and progressive balance exercise. Pt would benefit from continued skilled therapy in order to reach goals and maximize functional lumbopelvic strength and ROM for return to normal mobility and ADL.    OBJECTIVE IMPAIRMENTS Abnormal gait, decreased activity tolerance, decreased endurance, decreased mobility, difficulty walking, decreased ROM, decreased strength, hypomobility, increased muscle spasms, impaired flexibility, improper body mechanics, postural  dysfunction, and pain.    ACTIVITY LIMITATIONS carrying, lifting, bending, sitting, standing, squatting, sleeping, stairs, transfers, and locomotion level   PARTICIPATION LIMITATIONS: cleaning, laundry, interpersonal relationship, driving, shopping, community activity, occupation, and exercise   PERSONAL FACTORS Age, Fitness, Time since onset of injury/illness/exacerbation, and 1-2 comorbidities:  are also affecting patient's functional outcome.          GOALS:     SHORT TERM GOALS: Target date: 01/26/2024        Pt will become independent with HEP in order to demonstrate synthesis of PT education.     Goal status: INITIAL   2.  Pt will report at least 2 pt reduction on NPRS scale for pain at worst in order to demonstrate functional improvement with household activity, self care, and ADL.    Goal status: INITIAL       LONG TERM GOALS: Target date: 06/30/2023        Pt  will become independent with final HEP in order to demonstrate  synthesis of PT education.    Goal status: INITIAL   2. Pt will demonstrate at least a 12.8 improvement in Oswestry Index in order to demonstrate a clinically significant change in LBP and function.   Goal status: INITIAL   3.  Pt will be able to demonstrate/report ability to walk >15 mins without pain or LOB in order to demonstrate functional improvement and tolerance to exercise and community mobility.    Goal status: INITIAL   4.  Pt will be able to demonstrate/report ability to bend, lift, mop without pain in order to demonstrate functional improvement and tolerance to exercise and community mobility.    Goal status: INITIAL  5. Pt will be able to demonstrate antigravity ankle DF in order to demonstrate functional improvement in LE function for community ambulation and reduced risk of falls.   Goal status: INITIAL     PLAN:   PLAN: PT FREQUENCY: 1-2x/week   PT DURATION: 12 weeks (plan to D/C in 8 wks)    PLANNED INTERVENTIONS: Therapeutic exercises, Therapeutic activity, Neuromuscular re-education, Balance training, Gait training, Patient/Family education, Joint manipulation, Joint mobilization, Stair training, Orthotic/Fit training, DME instructions, Aquatic Therapy, Dry Needling, Electrical stimulation, Spinal manipulation, Spinal mobilization, Cryotherapy, Moist heat, scar mobilization, Splintting, Taping, Vasopneumatic device, Traction, Ultrasound, Ionotophoresis 4mg /ml Dexamethasone , Manual therapy, and Re-evaluation.   PLAN FOR NEXT SESSION: NMES ASSIST for ankle DF*, AAROM ankle motion, standing balance, review of nerve gliding, generalized R LE strength  Dale Call, PT 12/24/2023, 9:42 AM

## 2023-12-28 ENCOUNTER — Other Ambulatory Visit (HOSPITAL_COMMUNITY): Payer: Self-pay | Admitting: Family Medicine

## 2023-12-31 ENCOUNTER — Encounter (HOSPITAL_BASED_OUTPATIENT_CLINIC_OR_DEPARTMENT_OTHER): Payer: Self-pay | Admitting: Physical Therapy

## 2023-12-31 ENCOUNTER — Ambulatory Visit (HOSPITAL_BASED_OUTPATIENT_CLINIC_OR_DEPARTMENT_OTHER): Admitting: Physical Therapy

## 2023-12-31 DIAGNOSIS — M545 Low back pain, unspecified: Secondary | ICD-10-CM

## 2023-12-31 DIAGNOSIS — M6281 Muscle weakness (generalized): Secondary | ICD-10-CM

## 2023-12-31 NOTE — Therapy (Signed)
 OUTPATIENT PHYSICAL THERAPY THORACOLUMBAR EVALUATION   Patient Name: Christian [name] MRN: [id number] DOB:March 13, 1946, 78 y.o., male Today's Date: 12/31/2023  END OF SESSION:  PT End of Session - 12/31/23 1544     Visit Number 3    Number of Visits 12    Date for PT Re-Evaluation 03/14/24    Authorization Type HTA    PT Start Time 1530    PT Stop Time 1610    PT Time Calculation (min) 40 min    Activity Tolerance Patient tolerated treatment well    Behavior During Therapy [facility] for tasks assessed/performed            Past Medical History:  Diagnosis Date   Back pain 06/04/2015   DVT of lower limb, acute (HCC) 01/13/2015   High blood pressure    High cholesterol    History of chicken pox 01/13/2015   Hyperlipidemia, mild 01/13/2015   Hypokalemia 01/13/2015   Obesity    Verrucous skin lesion 02/08/2016   Past Surgical History:  Procedure Laterality Date   CARDIOVERSION N/A 09/30/2022   Procedure: CARDIOVERSION;  Surgeon: Rolan Ezra RAMAN, MD;  Location: [facility] INVASIVE CV LAB;  Service: Cardiovascular;  Laterality: N/A;   EYE SURGERY Bilateral 02/2019   Dr Lavonia   RIGHT HEART CATH N/A 12/04/2021   Procedure: RIGHT HEART CATH;  Surgeon: Rolan Ezra RAMAN, MD;  Location: [facility] INVASIVE CV LAB;  Service: Cardiovascular;  Laterality: N/A;   Patient Active Problem List   Diagnosis Date Noted   Atrial fibrillation (HCC) 09/03/2023   Seborrheic dermatitis 04/27/2021   Coronary artery disease involving native heart without angina pectoris 04/27/2021   Osteoarthritis of knee 04/27/2021   Chest pressure 04/23/2021   Right hip pain 03/04/2021   Cataract 11/20/2017   Right shoulder pain 05/20/2017   Diverticulosis 05/20/2017   Preventative health care 05/20/2017   Verrucous skin lesion 02/08/2016   Low back pain 06/04/2015   Nonallergic rhinitis 02/10/2015   Sun-damaged skin 01/29/2015   Benign essential HTN 01/29/2015   Hyperglycemia 01/29/2015   Hyperlipidemia, mild 01/13/2015   History  of chicken pox 01/13/2015   Hypokalemia 01/13/2015   Obesity    Obstructive sleep apnea 06/10/2014   Hypertension 04/19/2013    PCP: Domenica Harlene DELENA, MD   REFERRING PROVIDER:  Duwayne Purchase, MD       REFERRING DIAG:  M54.50 (ICD-10-CM) - Low back pain, unspecified      Rationale for Evaluation and Treatment: Rehabilitation  THERAPY DIAG:  Pain, lumbar region  Muscle weakness (generalized)  ONSET DATE: Spring 2025  SUBJECTIVE:  SUBJECTIVE STATEMENT:  Pt notes that the ankle is moving into DF more. He has been trying to use his e-stim unit daily to try and get the ankle to move. Has felt walking is better.   Eval:  Pt reports pain/radiating starting a few months ago after working out in the garden. Pt started having pain coming down the leg and into the foot. MRI shows a bulging disc. Pt received a spinal injection that was mildly helpful. Due to time since onset of injury, the pain was gone and the shot did not change is concerns of drop foot. Pt has R foot  weakness and surgeon does not think surgery is indicated. Has NCV test done that shows fairly normal conduction. 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 Pt does have difficulty with losing balance/catching his toe. Initially balance was bad but it has gotten about 80% better. Pt is cautious with standing tasks. Pt does not stiffness in the morning but goes away once he comes up into standing. Pt does have NT into the feet/toes with pain into top of the foot. Pt is has fear of falling now.   PERTINENT HISTORY:  HLD, HTN, skin cancer, knee OA  PAIN:  Are you having pain? no: NPRS scale: 2-3/10 at worst Pain location: R sided LBP Pain description: aching, sharp intially  Aggravating factors: rotation, sitting and then transition   Relieving factors: gabapentin , nothing else helps pain   PRECAUTIONS: None  RED FLAGS: None   WEIGHT BEARING RESTRICTIONS: No  FALLS:  Has patient fallen in last 6 months? No  LIVING ENVIRONMENT: Lives with: lives with their family and lives with their spouse Lives in: House/apartment Stairs: 3 steps to change floors  Has following equipment at home: None  OCCUPATION: retired  Recreation: walking, walking dogs   PLOF: Independent  PATIENT GOALS: reduce pain and return to normal activity  OBJECTIVE:  Note: Objective measures were completed at Evaluation unless otherwise noted.  DIAGNOSTIC FINDINGS:  IMPRESSION: 1. No acute osseous abnormality. 2. Moderate osteoarthritis of the right hip.     Electronically Signed   By: Harrietta Sherry M.D.   On: 09/17/2023 14:04  PATIENT SURVEYS:   Modified Oswestry Low Back Pain Disability Questionnaire: 10 / 50 = 20.0 %   SCREENING FOR RED FLAGS: Bowel or bladder incontinence: No Spinal tumors: No Cauda equina syndrome: No Compression fracture: No Abdominal aneurysm: No  COGNITION: Overall cognitive status: Within functional limits for tasks assessed     SENSATION: WFL  MUSCLE LENGTH: Hamstrings: positive supine 90/90 Positive Ely's LUMBAR ROM:   AROM eval  Flexion 75%  Extension 50%  Right lateral flexion 75%  Left lateral flexion 75%  Right rotation 75%  Left rotation 75%   (Blank rows = not tested)  LOWER EXTREMITY ROM:   none painful  Active  Right eval Left eval  Hip flexion 100 100  Hip extension -5 -5  Hip abduction    Hip adduction    Hip internal rotation 25 25  Hip external rotation 30 30  Knee flexion    Knee extension    Ankle dorsiflexion    Ankle plantarflexion    Ankle inversion    Ankle eversion     (Blank rows = not tested)  LOWER EXTREMITY MMT:  none painful  MMT Right eval Left eval  Hip flexion 4+/5 4+/5  Hip extension    Hip abduction 4+/5 3+/5  Hip adduction  4+/5 4+/5  Hip internal rotation  Hip external rotation    Knee flexion    Knee extension 5/5 5/5  Ankle dorsiflexion 5/5 1/5  Ankle plantarflexion    Ankle inversion    Ankle eversion     (Blank rows = not tested)  Unable to perform great toe extension or toe abduction L4-5 myotomes with myopathic weakness  LUMBAR SPECIAL TESTS:  Slump test: positive SLR: positive GAIT: Distance walked: 12ft Assistive device utilized: None Level of assistance: Complete Independence Comments: decreased bilat step length, 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, ankle eversion/toe out compensation for foot clearance  TODAY'S TREATMENT:                                                                                                                              DATE:  8/20   [place] Estim supine R ankle DF: 10/20 on off, 5s ramp, 80 bps, 50% duty, 58 mA 15 min  Tandem balance on airex  30s 3x Airex march 2x20 Airex NBOS 30s 4x EC STS from knee height chair 2x10 no UE  HEP review, progress to anti-gravity DF position   8/13 [place] Estim R ankle DF: 10/30 on off, 5s ramp, 80 bps, 50% duty, 58 mA 15 min  Assisted DF 10x reps Tandem balance on airex  30s 3x  Self NMES unit set up, pad placement, safety    Eval:   Exercises - Assisted Dorsiflexion  - 3 x daily - 7 x weekly - 1 sets - 20 reps - Supine Sciatic Nerve Glide  - 3 x daily - 7 x weekly - 1 sets - 20 reps - Heel Raises with Counter Support  - 3 x daily - 7 x weekly - 1 sets - 20 reps    PATIENT EDUCATION:  Education details:  MOI, diagnosis, prognosis, anatomy, exercise progression, DOMS expectations, muscle firing,  envelope of function, HEP, POC  Person educated: Patient Education method: Explanation, Demonstration, Tactile cues, Verbal cues,  Education comprehension: verbalized understanding, returned demonstration, and verbal cues required     HOME EXERCISE PROGRAM:    Access Code: [email or web address] URL:  [email or web address] Date: 12/15/2023 Prepared by: Dale Call            ASSESSMENT:   CLINICAL IMPRESSION:   Pt now able to demo partial ROM, gravity eliminated R ankle DF today with palpable contraction of tibialis anterior. NMES able to elicit AROM ankle DF in supine position. Pt able to also progress pliant surface balance as well ans STS with focus on even weight distribution and progressive R LE WB tolerance. Plan to continue with ankle DF strength and balance/stability. Consider hurdle steps to aid in hip flexion compensation for obstacle navigation. Pt would benefit from continued skilled therapy in order to reach goals and maximize functional lumbopelvic strength and ROM for return to normal mobility and ADL.    OBJECTIVE IMPAIRMENTS Abnormal gait, decreased activity tolerance, decreased endurance, decreased mobility, difficulty walking, decreased ROM, decreased strength, hypomobility, increased muscle spasms,  impaired flexibility, improper body mechanics, postural dysfunction, and pain.    ACTIVITY LIMITATIONS carrying, lifting, bending, sitting, standing, squatting, sleeping, stairs, transfers, and locomotion level   PARTICIPATION LIMITATIONS: cleaning, laundry, interpersonal relationship, driving, shopping, community activity, occupation, and exercise   PERSONAL FACTORS Age, Fitness, Time since onset of injury/illness/exacerbation, and 1-2 comorbidities:  are also affecting patient's functional outcome.          GOALS:     SHORT TERM GOALS: Target date: 01/26/2024        Pt will become independent with HEP in order to demonstrate synthesis of PT education.     Goal status: INITIAL   2.  Pt will report at least 2 pt reduction on NPRS scale for pain at worst in order to demonstrate functional improvement with household activity, self care, and ADL.    Goal status: INITIAL       LONG TERM GOALS: Target date: 06/30/2023        Pt  will become  independent with final HEP in order to demonstrate synthesis of PT education.    Goal status: INITIAL   2. Pt will demonstrate at least a 12.8 improvement in Oswestry Index in order to demonstrate a clinically significant change in LBP and function.   Goal status: INITIAL   3.  Pt will be able to demonstrate/report ability to walk >15 mins without pain or LOB in order to demonstrate functional improvement and tolerance to exercise and community mobility.    Goal status: INITIAL   4.  Pt will be able to demonstrate/report ability to bend, lift, mop without pain in order to demonstrate functional improvement and tolerance to exercise and community mobility.    Goal status: INITIAL  5. Pt will be able to demonstrate antigravity ankle DF in order to demonstrate functional improvement in LE function for community ambulation and reduced risk of falls.   Goal status: INITIAL     PLAN:   PLAN: PT FREQUENCY: 1-2x/week   PT DURATION: 12 weeks (plan to D/C in 8 wks)    PLANNED INTERVENTIONS: Therapeutic exercises, Therapeutic activity, Neuromuscular re-education, Balance training, Gait training, Patient/Family education, Joint manipulation, Joint mobilization, Stair training, Orthotic/Fit training, DME instructions, Aquatic Therapy, Dry Needling, Electrical stimulation, Spinal manipulation, Spinal mobilization, Cryotherapy, Moist heat, scar mobilization, Splintting, Taping, Vasopneumatic device, Traction, Ultrasound, Ionotophoresis 4mg /ml Dexamethasone , Manual therapy, and Re-evaluation.   PLAN FOR NEXT SESSION: NMES ASSIST for ankle DF*, AAROM ankle motion, standing balance, review of nerve gliding, generalized R LE strength  Dale Call, PT 12/31/2023, 4:13 PM

## 2024-01-04 DIAGNOSIS — G4733 Obstructive sleep apnea (adult) (pediatric): Secondary | ICD-10-CM | POA: Diagnosis not present

## 2024-01-06 ENCOUNTER — Encounter: Payer: Self-pay | Admitting: Family Medicine

## 2024-01-07 ENCOUNTER — Ambulatory Visit (HOSPITAL_BASED_OUTPATIENT_CLINIC_OR_DEPARTMENT_OTHER): Payer: Self-pay | Admitting: Physical Therapy

## 2024-01-07 DIAGNOSIS — M6281 Muscle weakness (generalized): Secondary | ICD-10-CM

## 2024-01-07 DIAGNOSIS — M545 Low back pain, unspecified: Secondary | ICD-10-CM

## 2024-01-07 MED ORDER — AMLODIPINE BESYLATE 2.5 MG PO TABS: 2.5000 mg | ORAL_TABLET | Freq: Every day | ORAL | 3 refills | Status: AC

## 2024-01-07 NOTE — Therapy (Signed)
 OUTPATIENT PHYSICAL THERAPY THORACOLUMBAR EVALUATION   Patient Name: Christian [name] MRN: [id number] DOB:December 23, 1945, 78 y.o., male Today's Date: 01/07/2024  END OF SESSION:  PT End of Session - 01/07/24 1500     Visit Number 4    Number of Visits 12    Date for PT Re-Evaluation 03/14/24    Authorization Type HTA    PT Start Time 1447    PT Stop Time 1526    PT Time Calculation (min) 39 min    Activity Tolerance Patient tolerated treatment well    Behavior During Therapy [facility] for tasks assessed/performed             Past Medical History:  Diagnosis Date   Back pain 06/04/2015   DVT of lower limb, acute (HCC) 01/13/2015   High blood pressure    High cholesterol    History of chicken pox 01/13/2015   Hyperlipidemia, mild 01/13/2015   Hypokalemia 01/13/2015   Obesity    Verrucous skin lesion 02/08/2016   Past Surgical History:  Procedure Laterality Date   CARDIOVERSION N/A 09/30/2022   Procedure: CARDIOVERSION;  Surgeon: Rolan Ezra RAMAN, MD;  Location: [facility] INVASIVE CV LAB;  Service: Cardiovascular;  Laterality: N/A;   EYE SURGERY Bilateral 02/2019   Dr Lavonia   RIGHT HEART CATH N/A 12/04/2021   Procedure: RIGHT HEART CATH;  Surgeon: Rolan Ezra RAMAN, MD;  Location: [facility] INVASIVE CV LAB;  Service: Cardiovascular;  Laterality: N/A;   Patient Active Problem List   Diagnosis Date Noted   Atrial fibrillation (HCC) 09/03/2023   Seborrheic dermatitis 04/27/2021   Coronary artery disease involving native heart without angina pectoris 04/27/2021   Osteoarthritis of knee 04/27/2021   Chest pressure 04/23/2021   Right hip pain 03/04/2021   Cataract 11/20/2017   Right shoulder pain 05/20/2017   Diverticulosis 05/20/2017   Preventative health care 05/20/2017   Verrucous skin lesion 02/08/2016   Low back pain 06/04/2015   Nonallergic rhinitis 02/10/2015   Sun-damaged skin 01/29/2015   Benign essential HTN 01/29/2015   Hyperglycemia 01/29/2015   Hyperlipidemia, mild 01/13/2015   History  of chicken pox 01/13/2015   Hypokalemia 01/13/2015   Obesity    Obstructive sleep apnea 06/10/2014   Hypertension 04/19/2013    PCP: Domenica Harlene DELENA, MD   REFERRING PROVIDER:  Duwayne Purchase, MD       REFERRING DIAG:  M54.50 (ICD-10-CM) - Low back pain, unspecified      Rationale for Evaluation and Treatment: Rehabilitation  THERAPY DIAG:  Pain, lumbar region  Muscle weakness (generalized)  ONSET DATE: Spring 2025  SUBJECTIVE:  SUBJECTIVE STATEMENT:  Pt notes that the ankle is moving into DF more. He has been trying to use his e-stim unit daily to try and get the ankle to move. Has felt walking is better.   Eval:  Pt reports pain/radiating starting a few months ago after working out in the garden. Pt started having pain coming down the leg and into the foot. MRI shows a bulging disc. Pt received a spinal injection that was mildly helpful. Due to time since onset of injury, the pain was gone and the shot did not change is concerns of drop foot. Pt has R foot  weakness and surgeon does not think surgery is indicated. Has NCV test done that shows fairly normal conduction. 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 Pt does have difficulty with losing balance/catching his toe. Initially balance was bad but it has gotten about 80% better. Pt is cautious with standing tasks. Pt does not stiffness in the morning but goes away once he comes up into standing. Pt does have NT into the feet/toes with pain into top of the foot. Pt is has fear of falling now.   PERTINENT HISTORY:  HLD, HTN, skin cancer, knee OA  PAIN:  Are you having pain? no: NPRS scale: 2-3/10 at worst Pain location: R sided LBP Pain description: aching, sharp intially  Aggravating factors: rotation, sitting and then transition   Relieving factors: gabapentin , nothing else helps pain   PRECAUTIONS: None  RED FLAGS: None   WEIGHT BEARING RESTRICTIONS: No  FALLS:  Has patient fallen in last 6 months? No  LIVING ENVIRONMENT: Lives with: lives with their family and lives with their spouse Lives in: House/apartment Stairs: 3 steps to change floors  Has following equipment at home: None  OCCUPATION: retired  Recreation: walking, walking dogs   PLOF: Independent  PATIENT GOALS: reduce pain and return to normal activity  OBJECTIVE:  Note: Objective measures were completed at Evaluation unless otherwise noted.  DIAGNOSTIC FINDINGS:  IMPRESSION: 1. No acute osseous abnormality. 2. Moderate osteoarthritis of the right hip.     Electronically Signed   By: Harrietta Sherry M.D.   On: 09/17/2023 14:04  PATIENT SURVEYS:   Modified Oswestry Low Back Pain Disability Questionnaire: 10 / 50 = 20.0 %   SCREENING FOR RED FLAGS: Bowel or bladder incontinence: No Spinal tumors: No Cauda equina syndrome: No Compression fracture: No Abdominal aneurysm: No  COGNITION: Overall cognitive status: Within functional limits for tasks assessed     SENSATION: WFL  MUSCLE LENGTH: Hamstrings: positive supine 90/90 Positive Ely's LUMBAR ROM:   AROM eval  Flexion 75%  Extension 50%  Right lateral flexion 75%  Left lateral flexion 75%  Right rotation 75%  Left rotation 75%   (Blank rows = not tested)  LOWER EXTREMITY ROM:   none painful  Active  Right eval Left eval  Hip flexion 100 100  Hip extension -5 -5  Hip abduction    Hip adduction    Hip internal rotation 25 25  Hip external rotation 30 30  Knee flexion    Knee extension    Ankle dorsiflexion    Ankle plantarflexion    Ankle inversion    Ankle eversion     (Blank rows = not tested)  LOWER EXTREMITY MMT:  none painful  MMT Right eval Left eval  Hip flexion 4+/5 4+/5  Hip extension    Hip abduction 4+/5 3+/5  Hip adduction  4+/5 4+/5  Hip internal rotation  Hip external rotation    Knee flexion    Knee extension 5/5 5/5  Ankle dorsiflexion 5/5 1/5  Ankle plantarflexion    Ankle inversion    Ankle eversion     (Blank rows = not tested)  Unable to perform great toe extension or toe abduction L4-5 myotomes with myopathic weakness  LUMBAR SPECIAL TESTS:  Slump test: positive SLR: positive GAIT: Distance walked: 36ft Assistive device utilized: None Level of assistance: Complete Independence Comments: decreased bilat step length, 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, ankle eversion/toe out compensation for foot clearance  TODAY'S TREATMENT:                                                                                                                              DATE:   8/27   Russian Estim supine R ankle DF: 10/20 on off, 5s ramp, 80 bps, 50% duty, 58 mA 18 min  Hurdle step over 2x10 ea side 8 step up 3x8 ea side   8/20   Russian Estim supine R ankle DF: 10/20 on off, 5s ramp, 80 bps, 50% duty, 58 mA 15 min  Tandem balance on airex  30s 3x Airex march 2x20 Airex NBOS 30s 4x EC STS from knee height chair 2x10 no UE  HEP review, progress to anti-gravity DF position   8/13 [place] Estim R ankle DF: 10/30 on off, 5s ramp, 80 bps, 50% duty, 58 mA 15 min  Assisted DF 10x reps Tandem balance on airex  30s 3x  Self NMES unit set up, pad placement, safety    Eval:   Exercises - Assisted Dorsiflexion  - 3 x daily - 7 x weekly - 1 sets - 20 reps - Supine Sciatic Nerve Glide  - 3 x daily - 7 x weekly - 1 sets - 20 reps - Heel Raises with Counter Support  - 3 x daily - 7 x weekly - 1 sets - 20 reps    PATIENT EDUCATION:  Education details:  MOI, diagnosis, prognosis, anatomy, exercise progression, DOMS expectations, muscle firing,  envelope of function, HEP, POC  Person educated: Patient Education method: Explanation, Demonstration, Tactile cues, Verbal cues,  Education comprehension: verbalized  understanding, returned demonstration, and verbal cues required     HOME EXERCISE PROGRAM:    Access Code: [email or web address] URL: [email or web address] Date: 12/15/2023 Prepared by: Dale Call            ASSESSMENT:   CLINICAL IMPRESSION:   Pt continues to have improvement in assisted ankle DF with partial ROM against gravity and requires less amperage to achieve twitch response. Pt HEP progressed for SL stability and hip flexion strategy to prevent catching of toes on obstacles. Pt is making steady progress with therapy and is to continue with HEP daily. Pt does have lack of balance on R>L during SLS.  Pt would benefit from continued skilled therapy in order to reach goals and maximize functional lumbopelvic strength and ROM for return to  normal mobility and ADL.    OBJECTIVE IMPAIRMENTS Abnormal gait, decreased activity tolerance, decreased endurance, decreased mobility, difficulty walking, decreased ROM, decreased strength, hypomobility, increased muscle spasms, impaired flexibility, improper body mechanics, postural dysfunction, and pain.    ACTIVITY LIMITATIONS carrying, lifting, bending, sitting, standing, squatting, sleeping, stairs, transfers, and locomotion level   PARTICIPATION LIMITATIONS: cleaning, laundry, interpersonal relationship, driving, shopping, community activity, occupation, and exercise   PERSONAL FACTORS Age, Fitness, Time since onset of injury/illness/exacerbation, and 1-2 comorbidities:  are also affecting patient's functional outcome.          GOALS:     SHORT TERM GOALS: Target date: 01/26/2024        Pt will become independent with HEP in order to demonstrate synthesis of PT education.     Goal status: INITIAL   2.  Pt will report at least 2 pt reduction on NPRS scale for pain at worst in order to demonstrate functional improvement with household activity, self care, and ADL.    Goal status: INITIAL       LONG TERM GOALS: Target date:  06/30/2023        Pt  will become independent with final HEP in order to demonstrate synthesis of PT education.    Goal status: INITIAL   2. Pt will demonstrate at least a 12.8 improvement in Oswestry Index in order to demonstrate a clinically significant change in LBP and function.   Goal status: INITIAL   3.  Pt will be able to demonstrate/report ability to walk >15 mins without pain or LOB in order to demonstrate functional improvement and tolerance to exercise and community mobility.    Goal status: INITIAL   4.  Pt will be able to demonstrate/report ability to bend, lift, mop without pain in order to demonstrate functional improvement and tolerance to exercise and community mobility.    Goal status: INITIAL  5. Pt will be able to demonstrate antigravity ankle DF in order to demonstrate functional improvement in LE function for community ambulation and reduced risk of falls.   Goal status: INITIAL     PLAN:   PLAN: PT FREQUENCY: 1-2x/week   PT DURATION: 12 weeks (plan to D/C in 8 wks)    PLANNED INTERVENTIONS: Therapeutic exercises, Therapeutic activity, Neuromuscular re-education, Balance training, Gait training, Patient/Family education, Joint manipulation, Joint mobilization, Stair training, Orthotic/Fit training, DME instructions, Aquatic Therapy, Dry Needling, Electrical stimulation, Spinal manipulation, Spinal mobilization, Cryotherapy, Moist heat, scar mobilization, Splintting, Taping, Vasopneumatic device, Traction, Ultrasound, Ionotophoresis 4mg /ml Dexamethasone , Manual therapy, and Re-evaluation.   PLAN FOR NEXT SESSION: NMES ASSIST for ankle DF*, AAROM ankle motion, standing balance, review of nerve gliding, generalized R LE strength  Dale Call, PT 01/07/2024, 3:48 PM

## 2024-01-14 ENCOUNTER — Ambulatory Visit (HOSPITAL_BASED_OUTPATIENT_CLINIC_OR_DEPARTMENT_OTHER): Attending: Specialist | Admitting: Physical Therapy

## 2024-01-14 ENCOUNTER — Encounter (HOSPITAL_BASED_OUTPATIENT_CLINIC_OR_DEPARTMENT_OTHER): Payer: Self-pay | Admitting: Physical Therapy

## 2024-01-14 DIAGNOSIS — M545 Low back pain, unspecified: Secondary | ICD-10-CM | POA: Diagnosis not present

## 2024-01-14 DIAGNOSIS — M6281 Muscle weakness (generalized): Secondary | ICD-10-CM | POA: Diagnosis not present

## 2024-01-14 NOTE — Therapy (Signed)
 OUTPATIENT PHYSICAL THERAPY THORACOLUMBAR EVALUATION   Patient Name: Christian [name] MRN: [id number] DOB:10/18/1945, 78 y.o., male Today's Date: 01/14/2024  END OF SESSION:  PT End of Session - 01/14/24 0907     Visit Number 5    Number of Visits 12    Date for PT Re-Evaluation 03/14/24    Authorization Type HTA    PT Start Time 0850    PT Stop Time 0925    PT Time Calculation (min) 35 min    Activity Tolerance Patient tolerated treatment well    Behavior During Therapy [facility] for tasks assessed/performed              Past Medical History:  Diagnosis Date   Back pain 06/04/2015   DVT of lower limb, acute (HCC) 01/13/2015   High blood pressure    High cholesterol    History of chicken pox 01/13/2015   Hyperlipidemia, mild 01/13/2015   Hypokalemia 01/13/2015   Obesity    Verrucous skin lesion 02/08/2016   Past Surgical History:  Procedure Laterality Date   CARDIOVERSION N/A 09/30/2022   Procedure: CARDIOVERSION;  Surgeon: Rolan Ezra RAMAN, MD;  Location: [facility] INVASIVE CV LAB;  Service: Cardiovascular;  Laterality: N/A;   EYE SURGERY Bilateral 02/2019   Dr Lavonia   RIGHT HEART CATH N/A 12/04/2021   Procedure: RIGHT HEART CATH;  Surgeon: Rolan Ezra RAMAN, MD;  Location: [facility] INVASIVE CV LAB;  Service: Cardiovascular;  Laterality: N/A;   Patient Active Problem List   Diagnosis Date Noted   Atrial fibrillation (HCC) 09/03/2023   Seborrheic dermatitis 04/27/2021   Coronary artery disease involving native heart without angina pectoris 04/27/2021   Osteoarthritis of knee 04/27/2021   Chest pressure 04/23/2021   Right hip pain 03/04/2021   Cataract 11/20/2017   Right shoulder pain 05/20/2017   Diverticulosis 05/20/2017   Preventative health care 05/20/2017   Verrucous skin lesion 02/08/2016   Low back pain 06/04/2015   Nonallergic rhinitis 02/10/2015   Sun-damaged skin 01/29/2015   Benign essential HTN 01/29/2015   Hyperglycemia 01/29/2015   Hyperlipidemia, mild 01/13/2015    History of chicken pox 01/13/2015   Hypokalemia 01/13/2015   Obesity    Obstructive sleep apnea 06/10/2014   Hypertension 04/19/2013    PCP: Domenica Harlene DELENA, MD   REFERRING PROVIDER:  Duwayne Purchase, MD       REFERRING DIAG:  M54.50 (ICD-10-CM) - Low back pain, unspecified      Rationale for Evaluation and Treatment: Rehabilitation  THERAPY DIAG:  Pain, lumbar region  Muscle weakness (generalized)  ONSET DATE: Spring 2025  SUBJECTIVE:  SUBJECTIVE STATEMENT: Pt reports nos stumbles since last time. However, pt was trying to go up stairs without railings and found it was very difficult due to instability and had to have a friend pull him up the steps. Pt notes DF continues to gradually improve and toe motion improves.   Eval:  Pt reports pain/radiating starting a few months ago after working out in the garden. Pt started having pain coming down the leg and into the foot. MRI shows a bulging disc. Pt received a spinal injection that was mildly helpful. Due to time since onset of injury, the pain was gone and the shot did not change is concerns of drop foot. Pt has R foot  weakness and surgeon does not think surgery is indicated. Has NCV test done that shows fairly normal conduction. 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 Pt does have difficulty with losing balance/catching his toe. Initially balance was bad but it has gotten about 80% better. Pt is cautious with standing tasks. Pt does not stiffness in the morning but goes away once he comes up into standing. Pt does have NT into the feet/toes with pain into top of the foot. Pt is has fear of falling now.   PERTINENT HISTORY:  HLD, HTN, skin cancer, knee OA  PAIN:  Are you having pain? no: NPRS scale: no longer has pain Pain location: R  sided LBP Pain description: aching, sharp intially  Aggravating factors: rotation, sitting and then transition  Relieving factors: gabapentin , nothing else helps pain   PRECAUTIONS: None  RED FLAGS: None   WEIGHT BEARING RESTRICTIONS: No  FALLS:  Has patient fallen in last 6 months? No  LIVING ENVIRONMENT: Lives with: lives with their family and lives with their spouse Lives in: House/apartment Stairs: 3 steps to change floors  Has following equipment at home: None  OCCUPATION: retired  Recreation: walking, walking dogs   PLOF: Independent  PATIENT GOALS: reduce pain and return to normal activity  OBJECTIVE:  Note: Objective measures were completed at Evaluation unless otherwise noted.  DIAGNOSTIC FINDINGS:  IMPRESSION: 1. No acute osseous abnormality. 2. Moderate osteoarthritis of the right hip.     Electronically Signed   By: Harrietta Sherry M.D.   On: 09/17/2023 14:04  PATIENT SURVEYS:   Modified Oswestry Low Back Pain Disability Questionnaire: 10 / 50 = 20.0 %   SCREENING FOR RED FLAGS: Bowel or bladder incontinence: No Spinal tumors: No Cauda equina syndrome: No Compression fracture: No Abdominal aneurysm: No  COGNITION: Overall cognitive status: Within functional limits for tasks assessed     SENSATION: WFL  MUSCLE LENGTH: Hamstrings: positive supine 90/90 Positive Ely's LUMBAR ROM:   AROM eval  Flexion 75%  Extension 50%  Right lateral flexion 75%  Left lateral flexion 75%  Right rotation 75%  Left rotation 75%   (Blank rows = not tested)  LOWER EXTREMITY ROM:   none painful  Active  Right eval Left eval  Hip flexion 100 100  Hip extension -5 -5  Hip abduction    Hip adduction    Hip internal rotation 25 25  Hip external rotation 30 30  Knee flexion    Knee extension    Ankle dorsiflexion    Ankle plantarflexion    Ankle inversion    Ankle eversion     (Blank rows = not tested)  LOWER EXTREMITY MMT:  none  painful  MMT Right eval Left eval  Hip flexion 4+/5 4+/5  Hip extension  Hip abduction 4+/5 3+/5  Hip adduction 4+/5 4+/5  Hip internal rotation    Hip external rotation    Knee flexion    Knee extension 5/5 5/5  Ankle dorsiflexion 5/5 1/5  Ankle plantarflexion    Ankle inversion    Ankle eversion     (Blank rows = not tested)  Unable to perform great toe extension or toe abduction L4-5 myotomes with myopathic weakness  LUMBAR SPECIAL TESTS:  Slump test: positive SLR: positive GAIT: Distance walked: 13ft Assistive device utilized: None Level of assistance: Complete Independence Comments: decreased bilat step length, 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, ankle eversion/toe out compensation for foot clearance  TODAY'S TREATMENT:                                                                                                                              DATE:   9/3  Russian Estim supine R ankle DF: 10/20 on off, 5s ramp, 80 bps, 50% duty, 43 mA 12 min  SLS with toe touch on L 30s 4x   8/27   Russian Estim supine R ankle DF: 10/20 on off, 5s ramp, 80 bps, 50% duty, 58 mA 18 min  Hurdle step over 2x10 ea side 8 step up 3x8 ea side   8/20   [place] Estim supine R ankle DF: 10/20 on off, 5s ramp, 80 bps, 50% duty, 58 mA 15 min  Tandem balance on airex  30s 3x Airex march 2x20 Airex NBOS 30s 4x EC STS from knee height chair 2x10 no UE  HEP review, progress to anti-gravity DF position   8/13 [place] Estim R ankle DF: 10/30 on off, 5s ramp, 80 bps, 50% duty, 58 mA 15 min  Assisted DF 10x reps Tandem balance on airex  30s 3x  Self NMES unit set up, pad placement, safety    Eval:   Exercises - Assisted Dorsiflexion  - 3 x daily - 7 x weekly - 1 sets - 20 reps - Supine Sciatic Nerve Glide  - 3 x daily - 7 x weekly - 1 sets - 20 reps - Heel Raises with Counter Support  - 3 x daily - 7 x weekly - 1 sets - 20 reps    PATIENT EDUCATION:  Education details:  anatomy,  exercise progression, DOMS expectations, muscle firing,  envelope of function, HEP, POC  Person educated: Patient Education method: Explanation, Demonstration, Tactile cues, Verbal cues,  Education comprehension: verbalized understanding, returned demonstration, and verbal cues required     HOME EXERCISE PROGRAM:    Access Code: [email or web address] URL: [email or web address] Date: 12/15/2023 Prepared by: Dale Call            ASSESSMENT:   CLINICAL IMPRESSION:   Pt continues to improve weekly with AAROM DF with more visible contraction of tib anterior and requiring less amperage to facilitate contraction. Pt HEP progressed to simulate more SL activity as pt's greatest community mobility complaint are uneven surfaces and stairs  without UE support. Plan to continue with current therapy and consider decrease in frequency once pt is able to demo AROM DF against gravity.  Pt would benefit from continued skilled therapy in order to reach goals and maximize functional lumbopelvic strength and ROM for return to normal mobility and ADL.    OBJECTIVE IMPAIRMENTS Abnormal gait, decreased activity tolerance, decreased endurance, decreased mobility, difficulty walking, decreased ROM, decreased strength, hypomobility, increased muscle spasms, impaired flexibility, improper body mechanics, postural dysfunction, and pain.    ACTIVITY LIMITATIONS carrying, lifting, bending, sitting, standing, squatting, sleeping, stairs, transfers, and locomotion level   PARTICIPATION LIMITATIONS: cleaning, laundry, interpersonal relationship, driving, shopping, community activity, occupation, and exercise   PERSONAL FACTORS Age, Fitness, Time since onset of injury/illness/exacerbation, and 1-2 comorbidities:  are also affecting patient's functional outcome.          GOALS:     SHORT TERM GOALS: Target date: 01/26/2024        Pt will become independent with HEP in order to demonstrate synthesis of PT  education.     Goal status: MET   2.  Pt will report at least 2 pt reduction on NPRS scale for pain at worst in order to demonstrate functional improvement with household activity, self care, and ADL.    Goal status: MET       LONG TERM GOALS: Target date: 06/30/2023        Pt  will become independent with final HEP in order to demonstrate synthesis of PT education.    Goal status: INITIAL   2. Pt will demonstrate at least a 12.8 improvement in Oswestry Index in order to demonstrate a clinically significant change in LBP and function.   Goal status: INITIAL   3.  Pt will be able to demonstrate/report ability to walk >15 mins without pain or LOB in order to demonstrate functional improvement and tolerance to exercise and community mobility.    Goal status: INITIAL   4.  Pt will be able to demonstrate/report ability to bend, lift, mop without pain in order to demonstrate functional improvement and tolerance to exercise and community mobility.    Goal status: INITIAL  5. Pt will be able to demonstrate antigravity ankle DF in order to demonstrate functional improvement in LE function for community ambulation and reduced risk of falls.   Goal status: INITIAL     PLAN:   PLAN: PT FREQUENCY: 1-2x/week   PT DURATION: 12 weeks (plan to D/C in 8 wks)    PLANNED INTERVENTIONS: Therapeutic exercises, Therapeutic activity, Neuromuscular re-education, Balance training, Gait training, Patient/Family education, Joint manipulation, Joint mobilization, Stair training, Orthotic/Fit training, DME instructions, Aquatic Therapy, Dry Needling, Electrical stimulation, Spinal manipulation, Spinal mobilization, Cryotherapy, Moist heat, scar mobilization, Splintting, Taping, Vasopneumatic device, Traction, Ultrasound, Ionotophoresis 4mg /ml Dexamethasone , Manual therapy, and Re-evaluation.   PLAN FOR NEXT SESSION: NMES ASSIST for ankle DF*, AAROM ankle motion, standing balance, review of nerve  gliding, generalized R LE strength  Dale Call, PT 01/14/2024, 9:29 AM

## 2024-01-21 ENCOUNTER — Encounter (HOSPITAL_BASED_OUTPATIENT_CLINIC_OR_DEPARTMENT_OTHER): Payer: Self-pay | Admitting: Physical Therapy

## 2024-01-21 ENCOUNTER — Ambulatory Visit (HOSPITAL_BASED_OUTPATIENT_CLINIC_OR_DEPARTMENT_OTHER): Admitting: Physical Therapy

## 2024-01-21 DIAGNOSIS — M6281 Muscle weakness (generalized): Secondary | ICD-10-CM

## 2024-01-21 DIAGNOSIS — M545 Low back pain, unspecified: Secondary | ICD-10-CM

## 2024-01-21 NOTE — Therapy (Signed)
 OUTPATIENT PHYSICAL THERAPY THORACOLUMBAR EVALUATION   Patient Name: Christian [name] MRN: [id number] DOB:[birth year], 78 y.o., male Today's Date: 01/21/2024  END OF SESSION:  PT End of Session - 01/21/24 0844     Visit Number 6    Number of Visits 12    Date for PT Re-Evaluation 03/14/24    Authorization Type HTA    PT Start Time 0845    PT Stop Time 0925    PT Time Calculation (min) 40 min    Activity Tolerance Patient tolerated treatment well    Behavior During Therapy [facility] for tasks assessed/performed              Past Medical History:  Diagnosis Date   Back pain 06/04/2015   DVT of lower limb, acute (HCC) 01/13/2015   High blood pressure    High cholesterol    History of chicken pox 01/13/2015   Hyperlipidemia, mild 01/13/2015   Hypokalemia 01/13/2015   Obesity    Verrucous skin lesion 02/08/2016   Past Surgical History:  Procedure Laterality Date   CARDIOVERSION N/A 09/30/2022   Procedure: CARDIOVERSION;  Surgeon: Rolan Ezra RAMAN, MD;  Location: [facility] INVASIVE CV LAB;  Service: Cardiovascular;  Laterality: N/A;   EYE SURGERY Bilateral 02/2019   Dr Lavonia   RIGHT HEART CATH N/A 12/04/2021   Procedure: RIGHT HEART CATH;  Surgeon: Rolan Ezra RAMAN, MD;  Location: [facility] INVASIVE CV LAB;  Service: Cardiovascular;  Laterality: N/A;   Patient Active Problem List   Diagnosis Date Noted   Atrial fibrillation (HCC) 09/03/2023   Seborrheic dermatitis 04/27/2021   Coronary artery disease involving native heart without angina pectoris 04/27/2021   Osteoarthritis of knee 04/27/2021   Chest pressure 04/23/2021   Right hip pain 03/04/2021   Cataract 11/20/2017   Right shoulder pain 05/20/2017   Diverticulosis 05/20/2017   Preventative health care 05/20/2017   Verrucous skin lesion 02/08/2016   Low back pain 06/04/2015   Nonallergic rhinitis 02/10/2015   Sun-damaged skin 01/29/2015   Benign essential HTN 01/29/2015   Hyperglycemia 01/29/2015   Hyperlipidemia, mild 01/13/2015    History of chicken pox 01/13/2015   Hypokalemia 01/13/2015   Obesity    Obstructive sleep apnea 06/10/2014   Hypertension 04/19/2013    PCP: Domenica Harlene DELENA, MD   REFERRING PROVIDER:  Duwayne Purchase, MD       REFERRING DIAG:  M54.50 (ICD-10-CM) - Low back pain, unspecified      Rationale for Evaluation and Treatment: Rehabilitation  THERAPY DIAG:  Pain, lumbar region  Muscle weakness (generalized)  ONSET DATE: Spring 2025  SUBJECTIVE:  SUBJECTIVE STATEMENT: Pt reports no falls or stumbles since last session. He is continuing to work on the balance. Pt notes the R ankle does hurt a little as he works on the stairs. Feels it in the bend of the ankle and goes away in about 3 steps. Pt no longer using the AFO and does not need the device. Has not had any back pain since last session.   Eval:  Pt reports pain/radiating starting a few months ago after working out in the garden. Pt started having pain coming down the leg and into the foot. MRI shows a bulging disc. Pt received a spinal injection that was mildly helpful. Due to time since onset of injury, the pain was gone and the shot did not change is concerns of drop foot. Pt has R foot  weakness and surgeon does not think surgery is indicated. Has NCV test done that shows fairly normal conduction. 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 Pt does have difficulty with losing balance/catching his toe. Initially balance was bad but it has gotten about 80% better. Pt is cautious with standing tasks. Pt does not stiffness in the morning but goes away once he comes up into standing. Pt does have NT into the feet/toes with pain into top of the foot. Pt is has fear of falling now.   PERTINENT HISTORY:  HLD, HTN, skin cancer, knee OA  PAIN:  Are you having  pain? no: NPRS scale: no longer has pain Pain location: R sided LBP Pain description: aching, sharp intially  Aggravating factors: rotation, sitting and then transition  Relieving factors: gabapentin , nothing else helps pain   PRECAUTIONS: None  RED FLAGS: None   WEIGHT BEARING RESTRICTIONS: No  FALLS:  Has patient fallen in last 6 months? No  LIVING ENVIRONMENT: Lives with: lives with their family and lives with their spouse Lives in: House/apartment Stairs: 3 steps to change floors  Has following equipment at home: None  OCCUPATION: retired  Recreation: walking, walking dogs   PLOF: Independent  PATIENT GOALS: reduce pain and return to normal activity  OBJECTIVE:  Note: Objective measures were completed at Evaluation unless otherwise noted.  DIAGNOSTIC FINDINGS:  IMPRESSION: 1. No acute osseous abnormality. 2. Moderate osteoarthritis of the right hip.     Electronically Signed   By: Harrietta Sherry M.D.   On: 09/17/2023 14:04  PATIENT SURVEYS:   Modified Oswestry Low Back Pain Disability Questionnaire: 10 / 50 = 20.0 %   SCREENING FOR RED FLAGS: Bowel or bladder incontinence: No Spinal tumors: No Cauda equina syndrome: No Compression fracture: No Abdominal aneurysm: No  COGNITION: Overall cognitive status: Within functional limits for tasks assessed     SENSATION: WFL  MUSCLE LENGTH: Hamstrings: positive supine 90/90 Positive Ely's LUMBAR ROM:   AROM eval  Flexion 75%  Extension 50%  Right lateral flexion 75%  Left lateral flexion 75%  Right rotation 75%  Left rotation 75%   (Blank rows = not tested)  LOWER EXTREMITY ROM:   none painful  Active  Right eval Left eval  Hip flexion 100 100  Hip extension -5 -5  Hip abduction    Hip adduction    Hip internal rotation 25 25  Hip external rotation 30 30  Knee flexion    Knee extension    Ankle dorsiflexion    Ankle plantarflexion    Ankle inversion    Ankle eversion     (Blank  rows = not tested)  LOWER  EXTREMITY MMT:  none painful  MMT Right eval Left eval  Hip flexion 4+/5 4+/5  Hip extension    Hip abduction 4+/5 3+/5  Hip adduction 4+/5 4+/5  Hip internal rotation    Hip external rotation    Knee flexion    Knee extension 5/5 5/5  Ankle dorsiflexion 5/5 1/5  Ankle plantarflexion    Ankle inversion    Ankle eversion     (Blank rows = not tested)  Unable to perform great toe extension or toe abduction L4-5 myotomes with myopathic weakness  LUMBAR SPECIAL TESTS:  Slump test: positive SLR: positive GAIT: Distance walked: 61ft Assistive device utilized: None Level of assistance: Complete Independence Comments: decreased bilat step length, 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, ankle eversion/toe out compensation for foot clearance  TODAY'S TREATMENT:                                                                                                                              DATE:   9/10   Russian Estim supine R ankle DF: 10/30 on off, 5s ramp, 80 bps, 50% duty, 31 mA 15 min  SLS with toe touch on L 30s 4x  Yellow TB ankle IV, EV, PF 3x10 ea  HEP modifications, HEP exercise selection, POC 9/3  [place] Estim supine R ankle DF: 10/20 on off, 5s ramp, 80 bps, 50% duty, 43 mA 12 min  SLS with toe touch on L 30s 4x   8/27   [place] Estim supine R ankle DF: 10/20 on off, 5s ramp, 80 bps, 50% duty, 58 mA 18 min  Hurdle step over 2x10 ea side 8 step up 3x8 ea side   8/20   [place] Estim supine R ankle DF: 10/20 on off, 5s ramp, 80 bps, 50% duty, 58 mA 15 min  Tandem balance on airex  30s 3x Airex march 2x20 Airex NBOS 30s 4x EC STS from knee height chair 2x10 no UE  HEP review, progress to anti-gravity DF position   8/13 [place] Estim R ankle DF: 10/30 on off, 5s ramp, 80 bps, 50% duty, 58 mA 15 min  Assisted DF 10x reps Tandem balance on airex  30s 3x  Self NMES unit set up, pad placement, safety    Eval:   Exercises - Assisted  Dorsiflexion  - 3 x daily - 7 x weekly - 1 sets - 20 reps - Supine Sciatic Nerve Glide  - 3 x daily - 7 x weekly - 1 sets - 20 reps - Heel Raises with Counter Support  - 3 x daily - 7 x weekly - 1 sets - 20 reps    PATIENT EDUCATION:  Education details:  anatomy, exercise progression, DOMS expectations, muscle firing,  envelope of function, HEP, POC  Person educated: Patient Education method: Explanation, Demonstration, Tactile cues, Verbal cues,  Education comprehension: verbalized understanding, returned demonstration, and verbal cues required     HOME EXERCISE PROGRAM:    Access  Code: [email or web address] URL: [email or web address] Date: 12/15/2023 Prepared by: Dale Call            ASSESSMENT:   CLINICAL IMPRESSION: Pt contraction intensity able to be sustained with less amperage today with NMES assist. Antigravity motion is still lacking but able to continue with IV/EV resistance with mild hip compensations. Pt has not had complaints of stumbles or near falls. Plan to decrease frequency with newly updated HEP as pt will likely continue to benefit from current exercise plan in place. Plan to continue with standing exercise as tolerated at future visits. Pt would benefit from continued skilled therapy in order to reach goals and maximize functional lumbopelvic strength and ROM for return to normal mobility and ADL.    OBJECTIVE IMPAIRMENTS Abnormal gait, decreased activity tolerance, decreased endurance, decreased mobility, difficulty walking, decreased ROM, decreased strength, hypomobility, increased muscle spasms, impaired flexibility, improper body mechanics, postural dysfunction, and pain.    ACTIVITY LIMITATIONS carrying, lifting, bending, sitting, standing, squatting, sleeping, stairs, transfers, and locomotion level   PARTICIPATION LIMITATIONS: cleaning, laundry, interpersonal relationship, driving, shopping, community activity, occupation, and exercise   PERSONAL FACTORS  Age, Fitness, Time since onset of injury/illness/exacerbation, and 1-2 comorbidities:  are also affecting patient's functional outcome.          GOALS:     SHORT TERM GOALS: Target date: 01/26/2024        Pt will become independent with HEP in order to demonstrate synthesis of PT education.     Goal status: MET   2.  Pt will report at least 2 pt reduction on NPRS scale for pain at worst in order to demonstrate functional improvement with household activity, self care, and ADL.    Goal status: MET       LONG TERM GOALS: Target date: 06/30/2023        Pt  will become independent with final HEP in order to demonstrate synthesis of PT education.    Goal status: INITIAL   2. Pt will demonstrate at least a 12.8 improvement in Oswestry Index in order to demonstrate a clinically significant change in LBP and function.   Goal status: INITIAL   3.  Pt will be able to demonstrate/report ability to walk >15 mins without pain or LOB in order to demonstrate functional improvement and tolerance to exercise and community mobility.    Goal status: INITIAL   4.  Pt will be able to demonstrate/report ability to bend, lift, mop without pain in order to demonstrate functional improvement and tolerance to exercise and community mobility.    Goal status: INITIAL  5. Pt will be able to demonstrate antigravity ankle DF in order to demonstrate functional improvement in LE function for community ambulation and reduced risk of falls.   Goal status: INITIAL     PLAN:   PLAN: PT FREQUENCY: 1-2x/week   PT DURATION: 12 weeks (plan to D/C in 8 wks)    PLANNED INTERVENTIONS: Therapeutic exercises, Therapeutic activity, Neuromuscular re-education, Balance training, Gait training, Patient/Family education, Joint manipulation, Joint mobilization, Stair training, Orthotic/Fit training, DME instructions, Aquatic Therapy, Dry Needling, Electrical stimulation, Spinal manipulation, Spinal mobilization,  Cryotherapy, Moist heat, scar mobilization, Splintting, Taping, Vasopneumatic device, Traction, Ultrasound, Ionotophoresis 4mg /ml Dexamethasone , Manual therapy, and Re-evaluation.   PLAN FOR NEXT SESSION: NMES ASSIST for ankle DF*, AAROM ankle motion, standing balance, review of nerve gliding, generalized R LE strength  Dale Call, PT 01/21/2024, 9:28 AM

## 2024-01-28 ENCOUNTER — Encounter (HOSPITAL_BASED_OUTPATIENT_CLINIC_OR_DEPARTMENT_OTHER): Admitting: Physical Therapy

## 2024-01-31 NOTE — Therapy (Unsigned)
 OUTPATIENT PHYSICAL THERAPY THORACOLUMBAR TREATMENT   Patient Name: ZYMIR NAPOLI MRN: [id number] DOB:Oct 31, 1945, 78 y.o., male Today's Date: 02/04/2024  END OF SESSION:  PT End of Session - 02/04/24 1102     Visit Number 7    Number of Visits 12    Date for Recertification  03/14/24    Authorization Type HTA    PT Start Time 1015    PT Stop Time 1055    PT Time Calculation (min) 40 min    Activity Tolerance Patient tolerated treatment well    Behavior During Therapy [facility] for tasks assessed/performed               Past Medical History:  Diagnosis Date   Back pain 06/04/2015   DVT of lower limb, acute (HCC) 01/13/2015   High blood pressure    High cholesterol    History of chicken pox 01/13/2015   Hyperlipidemia, mild 01/13/2015   Hypokalemia 01/13/2015   Obesity    Verrucous skin lesion 02/08/2016   Past Surgical History:  Procedure Laterality Date   CARDIOVERSION N/A 09/30/2022   Procedure: CARDIOVERSION;  Surgeon: Rolan Ezra RAMAN, MD;  Location: [facility] INVASIVE CV LAB;  Service: Cardiovascular;  Laterality: N/A;   EYE SURGERY Bilateral 02/2019   Dr Lavonia   RIGHT HEART CATH N/A 12/04/2021   Procedure: RIGHT HEART CATH;  Surgeon: Rolan Ezra RAMAN, MD;  Location: [facility] INVASIVE CV LAB;  Service: Cardiovascular;  Laterality: N/A;   Patient Active Problem List   Diagnosis Date Noted   Atrial fibrillation (HCC) 09/03/2023   Seborrheic dermatitis 04/27/2021   Coronary artery disease involving native heart without angina pectoris 04/27/2021   Osteoarthritis of knee 04/27/2021   Chest pressure 04/23/2021   Right hip pain 03/04/2021   Cataract 11/20/2017   Right shoulder pain 05/20/2017   Diverticulosis 05/20/2017   Preventative health care 05/20/2017   Verrucous skin lesion 02/08/2016   Low back pain 06/04/2015   Nonallergic rhinitis 02/10/2015   Sun-damaged skin 01/29/2015   Benign essential HTN 01/29/2015   Hyperglycemia 01/29/2015   Hyperlipidemia, mild 01/13/2015    History of chicken pox 01/13/2015   Hypokalemia 01/13/2015   Obesity    Obstructive sleep apnea 06/10/2014   Hypertension 04/19/2013    PCP: Domenica Harlene DELENA, MD   REFERRING PROVIDER:  Duwayne Purchase, MD       REFERRING DIAG:  M54.50 (ICD-10-CM) - Low back pain, unspecified      Rationale for Evaluation and Treatment: Rehabilitation  THERAPY DIAG:  Pain, lumbar region  Muscle weakness (generalized)  ONSET DATE: Spring 2025  SUBJECTIVE:  SUBJECTIVE STATEMENT: I walked at [facility] with no issue. I had a walking stick with me, but did not feel the need to use it.   Eval:  Pt reports pain/radiating starting a few months ago after working out in the garden. Pt started having pain coming down the leg and into the foot. MRI shows a bulging disc. Pt received a spinal injection that was mildly helpful. Due to time since onset of injury, the pain was gone and the shot did not change is concerns of drop foot. Pt has R foot  weakness and surgeon does not think surgery is indicated. Has NCV test done that shows fairly normal conduction. 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 Pt does have difficulty with losing balance/catching his toe. Initially balance was bad but it has gotten about 80% better. Pt is cautious with standing tasks. Pt does not stiffness in the morning but goes away once he comes up into standing. Pt does have NT into the feet/toes with pain into top of the foot. Pt is has fear of falling now.   PERTINENT HISTORY:  HLD, HTN, skin cancer, knee OA  PAIN:  Are you having pain? no: NPRS scale: no longer has pain Pain location: R sided LBP Pain description: aching, sharp intially  Aggravating factors: rotation, sitting and then transition  Relieving factors: gabapentin , nothing else helps pain    PRECAUTIONS: None  RED FLAGS: None   WEIGHT BEARING RESTRICTIONS: No  FALLS:  Has patient fallen in last 6 months? No  LIVING ENVIRONMENT: Lives with: lives with their family and lives with their spouse Lives in: House/apartment Stairs: 3 steps to change floors  Has following equipment at home: None  OCCUPATION: retired  Recreation: walking, walking dogs   PLOF: Independent  PATIENT GOALS: reduce pain and return to normal activity  OBJECTIVE:  Note: Objective measures were completed at Evaluation unless otherwise noted.  DIAGNOSTIC FINDINGS:  IMPRESSION: 1. No acute osseous abnormality. 2. Moderate osteoarthritis of the right hip.     Electronically Signed   By: Harrietta Sherry M.D.   On: 09/17/2023 14:04  PATIENT SURVEYS:   Modified Oswestry Low Back Pain Disability Questionnaire: 10 / 50 = 20.0 %   SCREENING FOR RED FLAGS: Bowel or bladder incontinence: No Spinal tumors: No Cauda equina syndrome: No Compression fracture: No Abdominal aneurysm: No  COGNITION: Overall cognitive status: Within functional limits for tasks assessed     SENSATION: WFL  MUSCLE LENGTH: Hamstrings: positive supine 90/90 Positive Ely's LUMBAR ROM:   AROM eval  Flexion 75%  Extension 50%  Right lateral flexion 75%  Left lateral flexion 75%  Right rotation 75%  Left rotation 75%   (Blank rows = not tested)  LOWER EXTREMITY ROM:   none painful  Active  Right eval Left eval  Hip flexion 100 100  Hip extension -5 -5  Hip abduction    Hip adduction    Hip internal rotation 25 25  Hip external rotation 30 30  Knee flexion    Knee extension    Ankle dorsiflexion    Ankle plantarflexion    Ankle inversion    Ankle eversion     (Blank rows = not tested)  LOWER EXTREMITY MMT:  none painful  MMT Right eval Left eval  Hip flexion 4+/5 4+/5  Hip extension    Hip abduction 4+/5 3+/5  Hip adduction 4+/5 4+/5  Hip internal rotation    Hip external rotation     Knee  flexion    Knee extension 5/5 5/5  Ankle dorsiflexion 5/5 1/5  Ankle plantarflexion    Ankle inversion    Ankle eversion     (Blank rows = not tested)  Unable to perform great toe extension or toe abduction L4-5 myotomes with myopathic weakness  LUMBAR SPECIAL TESTS:  Slump test: positive SLR: positive GAIT: Distance walked: 60ft Assistive device utilized: None Level of assistance: Complete Independence Comments: decreased bilat step length, 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, ankle eversion/toe out compensation for foot clearance  TODAY'S TREATMENT:                                                                                                                              DATE:  09/24  Russian Estim supine R ankle DF: 10/30 on off, 5s ramp, 80 bps, 50% duty, 28 mA 15 min  Airex SL stance Airex tandem heel to toe Toe touch onto 2 step  Two up one down Heel raises  9/10   [place] Estim supine R ankle DF: 10/30 on off, 5s ramp, 80 bps, 50% duty, 31 mA 15 min  SLS with toe touch on L 30s 4x  Yellow TB ankle IV, EV, PF 3x10 ea  HEP modifications, HEP exercise selection, POC 9/3  [place] Estim supine R ankle DF: 10/20 on off, 5s ramp, 80 bps, 50% duty, 43 mA 12 min  SLS with toe touch on L 30s 4x   8/27   [place] Estim supine R ankle DF: 10/20 on off, 5s ramp, 80 bps, 50% duty, 58 mA 18 min  Hurdle step over 2x10 ea side 8 step up 3x8 ea side   8/20   [place] Estim supine R ankle DF: 10/20 on off, 5s ramp, 80 bps, 50% duty, 58 mA 15 min  Tandem balance on airex  30s 3x Airex march 2x20 Airex NBOS 30s 4x EC STS from knee height chair 2x10 no UE  HEP review, progress to anti-gravity DF position   8/13 [place] Estim R ankle DF: 10/30 on off, 5s ramp, 80 bps, 50% duty, 58 mA 15 min  Assisted DF 10x reps Tandem balance on airex  30s 3x  Self NMES unit set up, pad placement, safety    Eval:   Exercises - Assisted Dorsiflexion  - 3 x daily - 7 x weekly - 1  sets - 20 reps - Supine Sciatic Nerve Glide  - 3 x daily - 7 x weekly - 1 sets - 20 reps - Heel Raises with Counter Support  - 3 x daily - 7 x weekly - 1 sets - 20 reps    PATIENT EDUCATION:  Education details:  anatomy, exercise progression, DOMS expectations, muscle firing,  envelope of function, HEP, POC  Person educated: Patient Education method: Explanation, Demonstration, Tactile cues, Verbal cues,  Education comprehension: verbalized understanding, returned demonstration, and verbal cues required     HOME EXERCISE PROGRAM:    Access Code: [email or web address] URL:  [email or web address] Date: 12/15/2023 Prepared by: Dale Call            ASSESSMENT:   CLINICAL IMPRESSION: Pt contraction intensity able to be sustained with less amperage today with NMES assist. Antigravity motion is still lacking. Pt has not had complaints of stumbles or near falls. Increased tolerance with standing exercises today with good. Pt would benefit from continued skilled therapy in order to reach goals and maximize functional lumbopelvic strength and ROM for return to normal mobility and ADL.    OBJECTIVE IMPAIRMENTS Abnormal gait, decreased activity tolerance, decreased endurance, decreased mobility, difficulty walking, decreased ROM, decreased strength, hypomobility, increased muscle spasms, impaired flexibility, improper body mechanics, postural dysfunction, and pain.    ACTIVITY LIMITATIONS carrying, lifting, bending, sitting, standing, squatting, sleeping, stairs, transfers, and locomotion level   PARTICIPATION LIMITATIONS: cleaning, laundry, interpersonal relationship, driving, shopping, community activity, occupation, and exercise   PERSONAL FACTORS Age, Fitness, Time since onset of injury/illness/exacerbation, and 1-2 comorbidities:  are also affecting patient's functional outcome.          GOALS:     SHORT TERM GOALS: Target date: 01/26/2024        Pt will become independent with  HEP in order to demonstrate synthesis of PT education.     Goal status: MET   2.  Pt will report at least 2 pt reduction on NPRS scale for pain at worst in order to demonstrate functional improvement with household activity, self care, and ADL.    Goal status: MET       LONG TERM GOALS: Target date: 06/30/2023        Pt  will become independent with final HEP in order to demonstrate synthesis of PT education.    Goal status: INITIAL   2. Pt will demonstrate at least a 12.8 improvement in Oswestry Index in order to demonstrate a clinically significant change in LBP and function.   Goal status: INITIAL   3.  Pt will be able to demonstrate/report ability to walk >15 mins without pain or LOB in order to demonstrate functional improvement and tolerance to exercise and community mobility.    Goal status: INITIAL   4.  Pt will be able to demonstrate/report ability to bend, lift, mop without pain in order to demonstrate functional improvement and tolerance to exercise and community mobility.    Goal status: INITIAL  5. Pt will be able to demonstrate antigravity ankle DF in order to demonstrate functional improvement in LE function for community ambulation and reduced risk of falls.   Goal status: INITIAL     PLAN:   PLAN: PT FREQUENCY: 1-2x/week   PT DURATION: 12 weeks (plan to D/C in 8 wks)    PLANNED INTERVENTIONS: Therapeutic exercises, Therapeutic activity, Neuromuscular re-education, Balance training, Gait training, Patient/Family education, Joint manipulation, Joint mobilization, Stair training, Orthotic/Fit training, DME instructions, Aquatic Therapy, Dry Needling, Electrical stimulation, Spinal manipulation, Spinal mobilization, Cryotherapy, Moist heat, scar mobilization, Splintting, Taping, Vasopneumatic device, Traction, Ultrasound, Ionotophoresis 4mg /ml Dexamethasone , Manual therapy, and Re-evaluation.   PLAN FOR NEXT SESSION: NMES ASSIST for ankle DF*, AAROM ankle  motion, standing balance, review of nerve gliding, generalized R LE strength  Rojean JONELLE Batten, PT 02/04/2024, 11:30 AM

## 2024-02-04 ENCOUNTER — Ambulatory Visit (HOSPITAL_BASED_OUTPATIENT_CLINIC_OR_DEPARTMENT_OTHER): Admitting: Physical Therapy

## 2024-02-04 ENCOUNTER — Encounter (HOSPITAL_BASED_OUTPATIENT_CLINIC_OR_DEPARTMENT_OTHER): Payer: Self-pay | Admitting: Physical Therapy

## 2024-02-04 DIAGNOSIS — G4733 Obstructive sleep apnea (adult) (pediatric): Secondary | ICD-10-CM | POA: Diagnosis not present

## 2024-02-04 DIAGNOSIS — M545 Low back pain, unspecified: Secondary | ICD-10-CM | POA: Diagnosis not present

## 2024-02-04 DIAGNOSIS — M6281 Muscle weakness (generalized): Secondary | ICD-10-CM

## 2024-02-05 ENCOUNTER — Ambulatory Visit (HOSPITAL_COMMUNITY): Payer: Self-pay | Admitting: Cardiology

## 2024-02-05 ENCOUNTER — Ambulatory Visit (HOSPITAL_COMMUNITY)
Admission: RE | Admit: 2024-02-05 | Discharge: 2024-02-05 | Disposition: A | Discharge status: Home / Self Care | Source: Ambulatory Visit | Attending: Internal Medicine | Admitting: Internal Medicine

## 2024-02-05 DIAGNOSIS — I5032 Chronic diastolic (congestive) heart failure: Secondary | ICD-10-CM | POA: Diagnosis not present

## 2024-02-05 LAB — HEPATIC FUNCTION PANEL
ALT: 38 U/L (ref 0–44)
AST: 31 U/L (ref 15–41)
Albumin: 3.9 g/dL (ref 3.5–5.0)
Alkaline Phosphatase: 70 U/L (ref 38–126)
Bilirubin, Direct: 0.1 mg/dL (ref 0.0–0.2)
Indirect Bilirubin: 0.9 mg/dL (ref 0.3–0.9)
Total Bilirubin: 1 mg/dL (ref 0.0–1.2)
Total Protein: 7.6 g/dL (ref 6.5–8.1)

## 2024-02-05 LAB — LIPID PANEL
Cholesterol: 137 mg/dL (ref 0–200)
HDL: 52 mg/dL (ref >40)
LDL Cholesterol: 68 mg/dL (ref 0–99)
Total CHOL/HDL Ratio: 2.6 ratio
Triglycerides: 87 mg/dL (ref <150)
VLDL: 17 mg/dL (ref 0–40)

## 2024-02-11 ENCOUNTER — Encounter (HOSPITAL_BASED_OUTPATIENT_CLINIC_OR_DEPARTMENT_OTHER): Payer: Self-pay

## 2024-02-11 ENCOUNTER — Emergency Department (HOSPITAL_BASED_OUTPATIENT_CLINIC_OR_DEPARTMENT_OTHER)
Admission: EM | Admit: 2024-02-11 | Discharge: 2024-02-11 | Disposition: A | Discharge status: Home / Self Care | Attending: Emergency Medicine | Admitting: Emergency Medicine

## 2024-02-11 ENCOUNTER — Other Ambulatory Visit: Payer: Self-pay

## 2024-02-11 ENCOUNTER — Emergency Department (HOSPITAL_BASED_OUTPATIENT_CLINIC_OR_DEPARTMENT_OTHER)

## 2024-02-11 DIAGNOSIS — I6782 Cerebral ischemia: Secondary | ICD-10-CM | POA: Diagnosis not present

## 2024-02-11 DIAGNOSIS — I480 Paroxysmal atrial fibrillation: Secondary | ICD-10-CM | POA: Insufficient documentation

## 2024-02-11 DIAGNOSIS — I10 Essential (primary) hypertension: Secondary | ICD-10-CM | POA: Insufficient documentation

## 2024-02-11 DIAGNOSIS — S161XXA Strain of muscle, fascia and tendon at neck level, initial encounter: Secondary | ICD-10-CM | POA: Diagnosis not present

## 2024-02-11 DIAGNOSIS — I6523 Occlusion and stenosis of bilateral carotid arteries: Secondary | ICD-10-CM | POA: Diagnosis not present

## 2024-02-11 DIAGNOSIS — I672 Cerebral atherosclerosis: Secondary | ICD-10-CM | POA: Diagnosis not present

## 2024-02-11 DIAGNOSIS — Z79899 Other long term (current) drug therapy: Secondary | ICD-10-CM | POA: Insufficient documentation

## 2024-02-11 DIAGNOSIS — E876 Hypokalemia: Secondary | ICD-10-CM | POA: Diagnosis not present

## 2024-02-11 DIAGNOSIS — M542 Cervicalgia: Secondary | ICD-10-CM | POA: Diagnosis not present

## 2024-02-11 DIAGNOSIS — Z7901 Long term (current) use of anticoagulants: Secondary | ICD-10-CM | POA: Diagnosis not present

## 2024-02-11 DIAGNOSIS — R9089 Other abnormal findings on diagnostic imaging of central nervous system: Secondary | ICD-10-CM | POA: Diagnosis not present

## 2024-02-11 LAB — CBC
HCT: 45.1 % (ref 39.0–52.0)
Hemoglobin: 14.9 g/dL (ref 13.0–17.0)
MCH: 31.1 pg (ref 26.0–34.0)
MCHC: 33 g/dL (ref 30.0–36.0)
MCV: 94.2 fL (ref 80.0–100.0)
Platelets: 199 K/uL (ref 150–400)
RBC: 4.79 MIL/uL (ref 4.22–5.81)
RDW: 13.4 % (ref 11.5–15.5)
WBC: 8.8 K/uL (ref 4.0–10.5)
nRBC: 0 % (ref 0.0–0.2)

## 2024-02-11 LAB — BASIC METABOLIC PANEL WITH GFR
Anion gap: 14 (ref 5–15)
BUN: 15 mg/dL (ref 8–23)
CO2: 24 mmol/L (ref 22–32)
Calcium: 10.3 mg/dL (ref 8.9–10.3)
Chloride: 99 mmol/L (ref 98–111)
Creatinine, Ser: 0.76 mg/dL (ref 0.61–1.24)
GFR, Estimated: >60 mL/min (ref >60)
Glucose, Bld: 122 mg/dL — ABNORMAL HIGH (ref 70–99)
Potassium: 3.3 mmol/L — ABNORMAL LOW (ref 3.5–5.1)
Sodium: 137 mmol/L (ref 135–145)

## 2024-02-11 MED ORDER — IOHEXOL 350 MG/ML SOLN
75.0000 mL | Freq: Once | INTRAVENOUS | Status: AC | PRN
  Administered 2024-02-11: 75 mL via INTRAVENOUS

## 2024-02-11 NOTE — Discharge Instructions (Addendum)
 Your neck pain today seems to be due to a muscle strain.  You may take up to 1000mg  of tylenol  every 6 hours as needed for pain.  Do not take more then 4g per day.  You may use up to 600mg  ibuprofen every 6 hours as needed for pain.  Do not exceed 2.4g of ibuprofen per day.  Apply warm compresses or heating packs to your neck to help with muscle stiffness. Please perform gentle range of motion of the neck (looking up, down, left, right) to loosen that muscle.  You were found to have a slightly low potassium level on your labs today. Please increase your dietary intake of potassium with foods such as avocados, potatoes, bananas, spinach, salmon. Please have your PCP monitor this value.    There was an abnormal finding on your CT scan today.  There is a piece of tissue along the anterior skull base. You need to make your PCP aware of this finding within the next 2 weeks so they can order an MRI of your brain for further evaluation of this abnormality.  I have included your CT scan results below so that you can show your PCP.  Please return the ER for any dizziness, headaches, vision changes, any other new or concerning symptoms   CTA HEAD AND NECK WITH AND WITHOUT  02/11/2024 06:43:48 PM    TECHNIQUE:  CTA of the head and neck was performed with and without the administration of  75 mL of iohexol  (OMNIPAQUE ) 350 MG/ML injection. Multiplanar 2D and/or 3D  reformatted images are provided for review. Automated exposure control,  iterative reconstruction, and/or weight based adjustment of the mA/kV was  utilized to reduce the radiation dose to as low as reasonably achievable.  Stenosis of the internal carotid arteries measured using NASCET criteria.    COMPARISON:  None available    CLINICAL HISTORY:  Neuro deficit, acute, stroke suspected. Triage note: Pt reports woke up last  night w/ stiff neck, has been ongoing and worsening since. Limited ROM in  triage, reports pain with movement  L/R and up. Most painful on R side. 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 500 mg Tylenol  at 1100 and 1500 today, unsure if gave relief.  Denies any headache/dizziness/numbness/weakness.    FINDINGS:    CTA NECK:    AORTIC ARCH AND ARCH VESSELS:  Moderate atherosclerosis of the visualized aortic arch. Left vertebral artery  originates on the aortic arch. No dissection or arterial injury. No significant  stenosis of the brachiocephalic or subclavian arteries.    CERVICAL CAROTID ARTERIES:  The right carotid artery is patent from the origin to the skull base with mild  calcified atherosclerosis at the right carotid bifurcation without  hemodynamically significant stenosis and mild tortuosity of the distal right  cervical ICA. The left carotid artery is patent from the origin to the skull  base with mild calcified atherosclerosis at the left carotid bifurcation  without hemodynamically significant stenosis and tortuosity of the distal left  cervical ICA. Partial retropharyngeal course of the distal common carotid  arteries and proximal cervical ICAs bilaterally. No dissection or arterial  injury.    CERVICAL VERTEBRAL ARTERIES:  Atherosclerosis at the origin of the left vertebral artery resulting in mild  stenosis. The vertebral arteries are patent from the origins to the  vertebrobasilar confluence. No dissection or arterial injury.    LUNGS AND MEDIASTINUM:  Similar appearance of mediastinal lymph nodes. Unremarkable.    SOFT TISSUES:  Bilateral lens replacement. Scattered  mucosal thickening throughout the  paranasal sinuses most pronounced in the ethmoid sinuses. Additional possible  mucous retention cysts in the maxillary sinuses and left sphenoid sinus. No  acute abnormality.    BONES:  Degenerative changes in the visualized spine. No acute abnormality.    CTA HEAD:    ANTERIOR CIRCULATION:  The intracranial internal carotid arteries are patent bilaterally. There is  mild  atherosclerosis of the carotid siphons without significant stenosis. The  anterior cerebral arteries are patent bilaterally. The middle cerebral arteries  are patent bilaterally. No aneurysm.    POSTERIOR CIRCULATION:  The posterior cerebral arteries are patent bilaterally. The basilar artery is  patent. The intracranial vertebral arteries are patent. No aneurysm.    OTHER:  No acute intracranial hemorrhage. 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 There  is no significant associated mass effect on the adjacent parenchyma. Consider  contrast enhanced MRI for further evaluation. Nonspecific hypoattenuation in  the periventricular and subcortical white matter, most likely representing  chronic microvascular ischemic changes. Mild parenchymal volume loss. No dural  venous sinus thrombosis on this non-dedicated study.    IMPRESSION:  1. No acute intracranial abnormality.  2. No large vessel occlusion.  3. 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 Consider contrast-enhanced MRI for further evaluation.  4. Mild atherosclerosis of the carotid siphons without significant stenosis.  5. Atherosclerosis at the origin of the left vertebral artery resulting in mild  stenosis.  6. Mild chronic microvascular ischemic changes and mild parenchymal volume loss.    Electronically signed by: Donnice Mania MD 02/11/2024 08:02 PM EDT RP  Workstation: [id number]

## 2024-02-11 NOTE — ED Notes (Signed)
 Reviewed discharge instructions and follow-up care with pt. Pt verbalized understanding and had no further questions. Pt exited ED without complications.

## 2024-02-11 NOTE — ED Provider Notes (Signed)
  Physical Exam  BP (!) 152/76 (BP Location: Left Arm)   Pulse 78   Temp 98.7 F (37.1 C) (Oral)   Resp 18   Ht 5' 8.5 (1.74 m)   Wt 99.8 kg   SpO2 96%   BMI 32.96 kg/m   Physical Exam Vitals and nursing note reviewed.  Constitutional:      Appearance: Normal appearance.  HENT:     Head: Atraumatic.  Neck:     Comments: Able to look left and right, up and down, but with somewhat limited range of motion due to pain  No overlying skin change Cardiovascular:     Rate and Rhythm: Normal rate and regular rhythm.  Pulmonary:     Effort: Pulmonary effort is normal.  Musculoskeletal:     Comments: No cervical spine tenderness to palpation  Neurological:     General: No focal deficit present.     Mental Status: He is alert.  Psychiatric:        Mood and Affect: Mood normal.        Behavior: Behavior normal.     Procedures  Procedures  ED Course / MDM    Medical Decision Making Amount and/or Complexity of Data Reviewed Labs: ordered. Radiology: ordered.  Risk Prescription drug management.   Patient accepted at shift change.  Please see prior providers note for full details.  In short, patient presenting with right sided neck pain that he noticed this morning after he woke up.  He does not have pain when sitting still, but does have pain when performing range of motion of the neck.  Denies any other associate symptoms such as headache, dizziness, vision changes.  CTA head and neck was ordered by previous team for evaluation of any potential vascular abnormalities due to patient's reported family history of his father having a blockage in one of the vessels in his neck.   CTA head and neck did not show any acute intracranial abnormality or any large vessel occlusion or dissection.  There was a soft tissue abnormality on the anterior skull base, possibly a meningioma.  I discussed the incidental finding of this soft tissue abnormality with patient.  He understands he needs  to obtain a outpatient MRI of the brain from his PCP.  He states he has an appointment with his PCP in 2 weeks, and will discuss this finding with his PCP.  Given reassuring imaging, and pain in his neck only with movement, suspect muscle strain.  Stable and appropriate for discharge home.       Veta Palma, PA-C 02/11/24 2223    Mannie Pac T, DO 02/16/24 [phone]

## 2024-02-11 NOTE — ED Triage Notes (Signed)
 Pt reports woke up last night w/ stiff neck, has been ongoing and worsening since. Limited ROM in triage, reports pain with movement L/R and up. Most painful on R side. 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 500 mg Tylenol  at 1100 and 1500 today, unsure if gave relief. Denies any headache/dizziness/numbness/weakness.

## 2024-02-11 NOTE — ED Provider Notes (Signed)
 [place] EMERGENCY DEPARTMENT AT [facility] Provider Note   CSN: [id number] Arrival date & time: 02/11/24  1628     Patient presents with: Neck Pain   Christian [name] is a 78 y.o. male with past medical history significant for hypertension, hyperlipidemia, paroxysmal A-fib, previous DVT, arthritis who does take Eliquis .  He does have some CAD who presents with concern for sudden onset right neck stiffness with right arm pain.  No significant proving with Tylenol .  Concerned about possible blockage in one of his neck arteries as he has strong family history of this.    Neck Pain      Prior to Admission medications   Medication Sig Start Date End Date Taking? Authorizing Provider  amLODipine  (NORVASC ) 2.5 MG tablet Take 1 tablet (2.5 mg total) by mouth daily. 01/07/24 04/06/24  Rolan Ezra RAMAN, MD  dapagliflozin  propanediol (FARXIGA ) 10 MG TABS tablet TAKE 1 TABLET BY MOUTH EVERY DAY BEFORE BREAKFAST 11/26/23   Rolan Ezra RAMAN, MD  ELIQUIS  5 MG TABS tablet TAKE 1 TABLET BY MOUTH TWICE A DAY 09/02/23   Rolan Ezra RAMAN, MD  hydrochlorothiazide  (HYDRODIURIL ) 25 MG tablet Take 1 tablet (25 mg total) by mouth daily. 03/12/23 12/05/23  Rolan Ezra RAMAN, MD  losartan  (COZAAR ) 100 MG tablet TAKE 1 TABLET BY MOUTH EVERY DAY 12/10/23   Domenica Harlene LABOR, MD  Multiple Vitamins-Minerals (PRESERVISION AREDS 2 PO) Take 1 capsule by mouth 2 (two) times daily.    [provider]  potassium chloride  SA (KLOR-CON  M) 20 MEQ tablet TAKE 1 TABLET BY MOUTH EVERY DAY 12/29/23   Rolan Ezra RAMAN, MD  rosuvastatin  (CRESTOR ) 40 MG tablet Take 1 tablet (40 mg total) by mouth daily. 12/05/23   Rolan Ezra RAMAN, MD    Allergies: Codeine    Review of Systems  Musculoskeletal:  Positive for neck pain.  All other systems reviewed and are negative.   Updated Vital Signs BP (!) 149/77   Pulse 81   Temp 98.6 F (37 C)   Resp 20   Ht 5' 8.5 (1.74 m)   Wt 99.8 kg   SpO2 95%   BMI 32.96 kg/m    Physical Exam Vitals and nursing note reviewed.  Constitutional:      General: He is not in acute distress.    Appearance: Normal appearance.  HENT:     Head: Normocephalic and atraumatic.  Eyes:     General:        Right eye: No discharge.        Left eye: No discharge.  Cardiovascular:     Rate and Rhythm: Normal rate and regular rhythm.     Pulses: Normal pulses.     Heart sounds: No murmur heard.    No friction rub. No gallop.  Pulmonary:     Effort: Pulmonary effort is normal.     Breath sounds: Normal breath sounds.  Abdominal:     General: Bowel sounds are normal.     Palpations: Abdomen is soft.  Skin:    General: Skin is warm and dry.     Capillary Refill: Capillary refill takes less than 2 seconds.  Neurological:     Mental Status: He is alert and oriented to person, place, and time.     Comments: Cranial nerves II through XII grossly intact.  Intact finger-nose, intact heel-to-shin.  Romberg negative, gait normal.  Alert and oriented x3.  Moves all 4 limbs spontaneously, normal coordination.  No pronator drift.  Intact strength 5 out of 5 bilateral upper and lower extremities.    Psychiatric:        Mood and Affect: Mood normal.        Behavior: Behavior normal.     (all labs ordered are listed, but only abnormal results are displayed) Labs Reviewed  BASIC METABOLIC PANEL WITH GFR - Abnormal; Notable for the following components:      Result Value   Potassium 3.3 (*)    Glucose, Bld 122 (*)    All other components within normal limits  CBC    EKG: None  Radiology: No results found.   Procedures   Medications Ordered in the ED  iohexol  (OMNIPAQUE ) 350 MG/ML injection 75 mL (75 mLs Intravenous Contrast Given 02/11/24 1837)                                    Medical Decision Making Amount and/or Complexity of Data Reviewed Labs: ordered.   This patient is a 78 y.o. male  who presents to the ED for concern of neck pain, head pain.    Differential diagnoses prior to evaluation: The emergent differential diagnosis includes, but is not limited to, head or neck vascular occlusion, stroke, cervical spine pathology including facet disease, arthritis, less clinical concern for acute unstable cervical spinal fracture given no traumatic injury, may be simple muscle spasm or cervical radiculopathy. This is not an exhaustive differential.   Past Medical History / Co-morbidities / Social History: hypertension, hyperlipidemia, paroxysmal A-fib, previous DVT, arthritis who does take Eliqui  Additional history: Chart reviewed. Pertinent results include: Reviewed lab work, imaging from previous emergency department visits, outpatient cardiology visits  Physical Exam: Physical exam performed. The pertinent findings include: Overall neurologically intact, he is somewhat hypertensive, blood pressure 183/84.  I can feel palpable carotid pulses in the neck bilaterally.  No significant tenderness to palpation of the trapezius, SCM on the right.  Normal range of motion to flexion, extension, abduction, adduction and  Lab Tests/Imaging studies: I personally interpreted labs/imaging and the pertinent results include: CBC unremarkable, BMP with mild hypokalemia potassium 3.3, otherwise unremarkable.  I agree with the radiologist interpretation.   6:46 PM Care of Christian [name] transferred to [facility] and Dr. Mannie at the end of my shift as the patient will require reassessment once labs/imaging have resulted. Patient presentation, ED course, and plan of care discussed with review of all pertinent labs and imaging. Please see his/her note for further details regarding further ED course and disposition. Plan at time of handoff is results of CTA head and neck, likely stable for discharge, suspect musculoskeletal neck pain, versus cervical radiculopathy. This may be altered or completely changed at the discretion of the oncoming team pending  results of further workup.   Final diagnoses:  None    ED Discharge Orders     None          Rosan Sherlean DEL, PA-C 02/11/24 1847    Mannie Pac T, DO 02/16/24 [phone]

## 2024-02-11 NOTE — ED Notes (Signed)
 Patient transported to CT

## 2024-02-23 ENCOUNTER — Encounter (HOSPITAL_BASED_OUTPATIENT_CLINIC_OR_DEPARTMENT_OTHER): Payer: Self-pay | Admitting: Physical Therapy

## 2024-02-23 ENCOUNTER — Ambulatory Visit (HOSPITAL_BASED_OUTPATIENT_CLINIC_OR_DEPARTMENT_OTHER): Attending: Specialist | Admitting: Physical Therapy

## 2024-02-23 DIAGNOSIS — M6281 Muscle weakness (generalized): Secondary | ICD-10-CM | POA: Diagnosis not present

## 2024-02-23 DIAGNOSIS — M545 Low back pain, unspecified: Secondary | ICD-10-CM | POA: Insufficient documentation

## 2024-02-23 NOTE — Therapy (Signed)
 OUTPATIENT PHYSICAL THERAPY THORACOLUMBAR TREATMENT   Patient Name: Christian [name] MRN: [id number] DOB:14-Dec-1945, 78 y.o., male Today's Date: 02/23/2024  END OF SESSION:  PT End of Session - 02/23/24 1121     Visit Number 8    Number of Visits 12    Date for Recertification  03/14/24    Authorization Type HTA    PT Start Time 1100    PT Stop Time 1134    PT Time Calculation (min) 34 min    Activity Tolerance Patient tolerated treatment well    Behavior During Therapy [facility] for tasks assessed/performed                Past Medical History:  Diagnosis Date   Back pain 06/04/2015   DVT of lower limb, acute (HCC) 01/13/2015   High blood pressure    High cholesterol    History of chicken pox 01/13/2015   Hyperlipidemia, mild 01/13/2015   Hypokalemia 01/13/2015   Obesity    Verrucous skin lesion 02/08/2016   Past Surgical History:  Procedure Laterality Date   CARDIOVERSION N/A 09/30/2022   Procedure: CARDIOVERSION;  Surgeon: Rolan Ezra RAMAN, MD;  Location: [facility] INVASIVE CV LAB;  Service: Cardiovascular;  Laterality: N/A;   EYE SURGERY Bilateral 02/2019   Dr Lavonia   RIGHT HEART CATH N/A 12/04/2021   Procedure: RIGHT HEART CATH;  Surgeon: Rolan Ezra RAMAN, MD;  Location: [facility] INVASIVE CV LAB;  Service: Cardiovascular;  Laterality: N/A;   Patient Active Problem List   Diagnosis Date Noted   Atrial fibrillation (HCC) 09/03/2023   Seborrheic dermatitis 04/27/2021   Coronary artery disease involving native heart without angina pectoris 04/27/2021   Osteoarthritis of knee 04/27/2021   Chest pressure 04/23/2021   Right hip pain 03/04/2021   Cataract 11/20/2017   Right shoulder pain 05/20/2017   Diverticulosis 05/20/2017   Preventative health care 05/20/2017   Verrucous skin lesion 02/08/2016   Low back pain 06/04/2015   Nonallergic rhinitis 02/10/2015   Sun-damaged skin 01/29/2015   Benign essential HTN 01/29/2015   Hyperglycemia 01/29/2015   Hyperlipidemia, mild 01/13/2015    History of chicken pox 01/13/2015   Hypokalemia 01/13/2015   Obesity    Obstructive sleep apnea 06/10/2014   Hypertension 04/19/2013    PCP: Domenica Harlene DELENA, MD   REFERRING PROVIDER:  Duwayne Purchase, MD       REFERRING DIAG:  M54.50 (ICD-10-CM) - Low back pain, unspecified      Rationale for Evaluation and Treatment: Rehabilitation  THERAPY DIAG:  Pain, lumbar region  Muscle weakness (generalized)  ONSET DATE: Spring 2025  SUBJECTIVE:  SUBJECTIVE STATEMENT: Pt reports in the last 3-4 days, his balance has improve greatly. He feels more steady and hasn't had back pain in a while. He is still unable to raise the toes fully but it has improved.   Eval:  Pt reports pain/radiating starting a few months ago after working out in the garden. Pt started having pain coming down the leg and into the foot. MRI shows a bulging disc. Pt received a spinal injection that was mildly helpful. Due to time since onset of injury, the pain was gone and the shot did not change is concerns of drop foot. Pt has R foot  weakness and surgeon does not think surgery is indicated. Has NCV test done that shows fairly normal conduction. 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 Pt does have difficulty with losing balance/catching his toe. Initially balance was bad but it has gotten about 80% better. Pt is cautious with standing tasks. Pt does not stiffness in the morning but goes away once he comes up into standing. Pt does have NT into the feet/toes with pain into top of the foot. Pt is has fear of falling now.   PERTINENT HISTORY:  HLD, HTN, skin cancer, knee OA  PAIN:  Are you having pain? no: NPRS scale: no longer has pain Pain location: R sided LBP Pain description: aching, sharp intially  Aggravating factors:  rotation, sitting and then transition  Relieving factors: gabapentin , nothing else helps pain   PRECAUTIONS: None  RED FLAGS: None   WEIGHT BEARING RESTRICTIONS: No  FALLS:  Has patient fallen in last 6 months? No  LIVING ENVIRONMENT: Lives with: lives with their family and lives with their spouse Lives in: House/apartment Stairs: 3 steps to change floors  Has following equipment at home: None  OCCUPATION: retired  Recreation: walking, walking dogs   PLOF: Independent  PATIENT GOALS: reduce pain and return to normal activity  OBJECTIVE:  Note: Objective measures were completed at Evaluation unless otherwise noted.  DIAGNOSTIC FINDINGS:  IMPRESSION: 1. No acute osseous abnormality. 2. Moderate osteoarthritis of the right hip.     Electronically Signed   By: Harrietta Sherry M.D.   On: 09/17/2023 14:04  PATIENT SURVEYS:   Modified Oswestry Low Back Pain Disability Questionnaire: 10 / 50 = 20.0 %    LOWER EXTREMITY ROM:   none painful  Active  Right eval Left eval  Hip flexion 100 100  Hip extension -5 -5  Hip abduction    Hip adduction    Hip internal rotation 25 25  Hip external rotation 30 30  Knee flexion    Knee extension    Ankle dorsiflexion    Ankle plantarflexion    Ankle inversion    Ankle eversion     (Blank rows = not tested)  LOWER EXTREMITY MMT:  none painful  MMT Right eval Left eval L   Hip flexion 4+/5 4+/5   Hip extension     Hip abduction 4+/5 3+/5   Hip adduction 4+/5 4+/5   Hip internal rotation     Hip external rotation     Knee flexion     Knee extension 5/5 5/5   Ankle dorsiflexion 5/5 1/5 3-/5  Ankle plantarflexion     Ankle inversion     Ankle eversion      (Blank rows = not tested)  Unable to perform great toe extension or toe abduction   LUMBAR SPECIAL TESTS:  Slump test: positive SLR: positive GAIT: Distance  walked: 2ft Assistive device utilized: None Level of assistance: Complete  Independence Comments: decreased bilat step length, 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, ankle eversion/toe out compensation for foot clearance  TODAY'S TREATMENT:                                                                                                                              DATE:   10/13  Russian Estim supine R ankle DF: 10/30 on off, 5s ramp, 80 bps, 50% duty, 29 mA 15 min Seated toe raise 3x8 Retro walking 3x in hallway with single UE assist Review of HEP; safety with HEP   09/24  [place] Estim supine R ankle DF: 10/30 on off, 5s ramp, 80 bps, 50% duty, 28 mA 15 min  Airex SL stance Airex tandem heel to toe Toe touch onto 2 step  Two up one down Heel raises  9/10   [place] Estim supine R ankle DF: 10/30 on off, 5s ramp, 80 bps, 50% duty, 31 mA 15 min  SLS with toe touch on L 30s 4x  Yellow TB ankle IV, EV, PF 3x10 ea  HEP modifications, HEP exercise selection, POC 9/3  [place] Estim supine R ankle DF: 10/20 on off, 5s ramp, 80 bps, 50% duty, 43 mA 12 min  SLS with toe touch on L 30s 4x   8/27   [place] Estim supine R ankle DF: 10/20 on off, 5s ramp, 80 bps, 50% duty, 58 mA 18 min  Hurdle step over 2x10 ea side 8 step up 3x8 ea side   8/20   [place] Estim supine R ankle DF: 10/20 on off, 5s ramp, 80 bps, 50% duty, 58 mA 15 min  Tandem balance on airex  30s 3x Airex march 2x20 Airex NBOS 30s 4x EC STS from knee height chair 2x10 no UE  HEP review, progress to anti-gravity DF position   8/13 [place] Estim R ankle DF: 10/30 on off, 5s ramp, 80 bps, 50% duty, 58 mA 15 min  Assisted DF 10x reps Tandem balance on airex  30s 3x  Self NMES unit set up, pad placement, safety    Eval:   Exercises - Assisted Dorsiflexion  - 3 x daily - 7 x weekly - 1 sets - 20 reps - Supine Sciatic Nerve Glide  - 3 x daily - 7 x weekly - 1 sets - 20 reps - Heel Raises with Counter Support  - 3 x daily - 7 x weekly - 1 sets - 20 reps    PATIENT EDUCATION:  Education  details:  anatomy, exercise progression, DOMS expectations, muscle firing,  envelope of function, HEP, POC  Person educated: Patient Education method: Explanation, Demonstration, Tactile cues, Verbal cues,  Education comprehension: verbalized understanding, returned demonstration, and verbal cues required     HOME EXERCISE PROGRAM:  Access Code: [email or web address] URL: [email or web address] Date: 12/15/2023 Prepared by: Dale Call            ASSESSMENT:  CLINICAL IMPRESSION: Pt able to demo partial ROM anti-gravity ankle DF now. Pt does have improved foot clearance and equal cadence with walking gait. Pt HEP updated today for anti-gravity muscle activity and dynamic balance focus on ankle DF. Plan to continue with progressive balance and R ankle strength as able. Pt would benefit from continued skilled therapy in order to reach goals and maximize functional lumbopelvic strength and ROM for return to normal mobility and ADL.    OBJECTIVE IMPAIRMENTS Abnormal gait, decreased activity tolerance, decreased endurance, decreased mobility, difficulty walking, decreased ROM, decreased strength, hypomobility, increased muscle spasms, impaired flexibility, improper body mechanics, postural dysfunction, and pain.    ACTIVITY LIMITATIONS carrying, lifting, bending, sitting, standing, squatting, sleeping, stairs, transfers, and locomotion level   PARTICIPATION LIMITATIONS: cleaning, laundry, interpersonal relationship, driving, shopping, community activity, occupation, and exercise   PERSONAL FACTORS Age, Fitness, Time since onset of injury/illness/exacerbation, and 1-2 comorbidities:  are also affecting patient's functional outcome.          GOALS:     SHORT TERM GOALS: Target date: 01/26/2024        Pt will become independent with HEP in order to demonstrate synthesis of PT education.     Goal status: MET   2.  Pt will report at least 2 pt reduction on NPRS scale for pain at worst  in order to demonstrate functional improvement with household activity, self care, and ADL.    Goal status: MET       LONG TERM GOALS: Target date: 06/30/2023        Pt  will become independent with final HEP in order to demonstrate synthesis of PT education.    Goal status: ongoing   2. Pt will demonstrate at least a 12.8 improvement in Oswestry Index in order to demonstrate a clinically significant change in LBP and function.   Goal status: ongoing   3.  Pt will be able to demonstrate/report ability to walk >15 mins without pain or LOB in order to demonstrate functional improvement and tolerance to exercise and community mobility.    Goal status: MET   4.  Pt will be able to demonstrate/report ability to bend, lift, mop without pain in order to demonstrate functional improvement and tolerance to exercise and community mobility.    Goal status: ongoing  5. Pt will be able to demonstrate antigravity ankle DF in order to demonstrate functional improvement in LE function for community ambulation and reduced risk of falls.   Goal status: ongoing     PLAN:   PLAN: PT FREQUENCY: 1-2x/week   PT DURATION: 12 weeks (plan to D/C in 8 wks)    PLANNED INTERVENTIONS: Therapeutic exercises, Therapeutic activity, Neuromuscular re-education, Balance training, Gait training, Patient/Family education, Joint manipulation, Joint mobilization, Stair training, Orthotic/Fit training, DME instructions, Aquatic Therapy, Dry Needling, Electrical stimulation, Spinal manipulation, Spinal mobilization, Cryotherapy, Moist heat, scar mobilization, Splintting, Taping, Vasopneumatic device, Traction, Ultrasound, Ionotophoresis 4mg /ml Dexamethasone , Manual therapy, and Re-evaluation.   PLAN FOR NEXT SESSION: NMES ASSIST for ankle DF*, AAROM ankle motion, standing balance, review of nerve gliding, generalized R LE strength  Dale Call, PT 02/23/2024, 11:44 AM

## 2024-02-29 ENCOUNTER — Other Ambulatory Visit: Payer: Self-pay | Admitting: Student

## 2024-02-29 ENCOUNTER — Other Ambulatory Visit (HOSPITAL_COMMUNITY): Payer: Self-pay | Admitting: Cardiology

## 2024-02-29 DIAGNOSIS — M5441 Lumbago with sciatica, right side: Secondary | ICD-10-CM

## 2024-02-29 NOTE — Assessment & Plan Note (Signed)
 Well controlled, no changes to meds. Encouraged heart healthy diet such as the DASH diet and exercise as tolerated.

## 2024-02-29 NOTE — Assessment & Plan Note (Signed)
Tolerating current meds and rate controlled.

## 2024-02-29 NOTE — Assessment & Plan Note (Signed)
 Monitor and report any concerns, no changes to meds. Encouraged heart healthy diet such as the DASH diet and exercise as tolerated.  ?

## 2024-02-29 NOTE — Assessment & Plan Note (Signed)
 Encourage heart healthy diet such as MIND or DASH diet, increase exercise, avoid trans fats, simple carbohydrates and processed foods, consider a krill or fish or flaxseed oil cap daily.

## 2024-02-29 NOTE — Progress Notes (Signed)
 Subjective:    Patient ID: Christian [name], male    DOB: 11-21-1945, 78 y.o.   MRN: [id number]  No chief complaint on file.   HPI Discussed the use of AI scribe software for clinical note transcription with the patient, who gave verbal consent to proceed.  History of Present Illness Christian [name] is a 78 year old male who presents with back pain and drop foot.  He has experienced a significant decline in his condition since the spring, describing it as 'very miserable.' His symptoms began around the time of his last visit and progressively worsened, leading to drop foot. He has undergone various procedures and consultations with multiple healthcare providers, including an MRI that revealed a bulged disc.  Initially, he sought care for back pain, which he attributes to carrying a growing puppy and working in the garden. The pain started after these activities and progressively worsened. An x-ray revealed arthritis on the right side. He was referred to the [facility], where further evaluation led to an MRI that showed a bulged disc.  He was referred to [facility], where he was advised to get a spinal shot to reduce inflammation and potentially shrink the bulged disc. However, he did not find relief and was later diagnosed with drop foot by another provider. He is currently undergoing physical therapy, which has improved his walking and balance. He is on his eighth or ninth week of a twelve-week program and notes that he is 'walking better' and not tripping as much. He uses a strap to support his foot in stressful areas but finds he can walk well without it in familiar environments.  He mentions a recent episode of throat pain on the right side, which he initially feared was related to carotid artery issues due to his father's history of carotid artery blockage. However, a CT angiogram showed no significant blockages, and the pain resolved with the use of a hot pack.  No  current discomfort and his blood pressure and pulse are stable. No recent falls or injuries that could have contributed to his back issues. No pain associated with his drop foot.    Past Medical History:  Diagnosis Date   Back pain 06/04/2015   DVT of lower limb, acute (HCC) 01/13/2015   High blood pressure    High cholesterol    History of chicken pox 01/13/2015   Hyperlipidemia, mild 01/13/2015   Hypokalemia 01/13/2015   Obesity    Verrucous skin lesion 02/08/2016    Past Surgical History:  Procedure Laterality Date   CARDIOVERSION N/A 09/30/2022   Procedure: CARDIOVERSION;  Surgeon: Rolan Ezra RAMAN, MD;  Location: [facility] INVASIVE CV LAB;  Service: Cardiovascular;  Laterality: N/A;   EYE SURGERY Bilateral 02/2019   Dr Lavonia   RIGHT HEART CATH N/A 12/04/2021   Procedure: RIGHT HEART CATH;  Surgeon: Rolan Ezra RAMAN, MD;  Location: [facility] INVASIVE CV LAB;  Service: Cardiovascular;  Laterality: N/A;    Family History  Problem Relation Age of Onset   Stroke Father        from surgery   Emphysema Father        smoker   Heart disease Father        carotid artery disease, vasculopathy   Allergic Disorder Sister     Social History   Socioeconomic History   Marital status: Married    Spouse name: Not on file   Number of children: 1   Years of education:  Not on file   Highest education level: Associate degree: academic program  Occupational History   Occupation: Retired  Tobacco Use   Smoking status: Never   Smokeless tobacco: Never  Substance and Sexual Activity   Alcohol use: Yes    Alcohol/week: 0.0 standard drinks of alcohol    Comment: 2-3 glasses of wine every night   Drug use: No   Sexual activity: Yes    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, retired from english as a second language teacher, no dietary restrictions  Other Topics Concern   Not on file  Social History Narrative   Not on file   Social Drivers of Health   Financial Resource Strain: Low Risk  (02/26/2024)   Overall Financial  Resource Strain (CARDIA)    Difficulty of Paying Living Expenses: Not hard at all  Food Insecurity: No Food Insecurity (02/26/2024)   Hunger Vital Sign    Worried About Running Out of Food in the Last Year: Never true    Ran Out of Food in the Last Year: Never true  Transportation Needs: No Transportation Needs (02/26/2024)   PRAPARE - Administrator, Civil Service (Medical): No    Lack of Transportation (Non-Medical): No  Physical Activity: Insufficiently Active (02/26/2024)   Exercise Vital Sign    Days of Exercise per Week: 4 days    Minutes of Exercise per Session: 20 min  Stress: No Stress Concern Present (02/26/2024)   [employer] of Occupational Health - Occupational Stress Questionnaire    Feeling of Stress: Not at all  Social Connections: Moderately Isolated (02/26/2024)   Social Connection and Isolation Panel    Frequency of Communication with Friends and Family: More than three times a week    Frequency of Social Gatherings with Friends and Family: Once a week    Attends Religious Services: Never    Database Administrator or Organizations: No    Attends Engineer, Structural: Not on file    Marital Status: Married  Catering Manager Violence: Not on file    Outpatient Medications Prior to Visit  Medication Sig Dispense Refill   amLODipine  (NORVASC ) 2.5 MG tablet Take 1 tablet (2.5 mg total) by mouth daily. 90 tablet 3   dapagliflozin  propanediol (FARXIGA ) 10 MG TABS tablet TAKE 1 TABLET BY MOUTH EVERY DAY BEFORE BREAKFAST 90 tablet 3   ELIQUIS  5 MG TABS tablet TAKE 1 TABLET BY MOUTH TWICE A DAY 60 tablet 11   hydrochlorothiazide  (HYDRODIURIL ) 25 MG tablet Take 1 tablet (25 mg total) by mouth daily. 90 tablet 3   losartan  (COZAAR ) 100 MG tablet TAKE 1 TABLET BY MOUTH EVERY DAY 90 tablet 1   Multiple Vitamins-Minerals (PRESERVISION AREDS 2 PO) Take 1 capsule by mouth 2 (two) times daily.     potassium chloride  SA (KLOR-CON  M) 20 MEQ tablet TAKE 1  TABLET BY MOUTH EVERY DAY 90 tablet 3   rosuvastatin  (CRESTOR ) 40 MG tablet Take 1 tablet (40 mg total) by mouth daily. 90 tablet 3   No facility-administered medications prior to visit.    Allergies  Allergen Reactions   Codeine Anaphylaxis    Review of Systems  Constitutional:  Negative for fever and malaise/fatigue.  HENT:  Negative for congestion.   Eyes:  Negative for blurred vision.  Respiratory:  Negative for shortness of breath.   Cardiovascular:  Negative for chest pain, palpitations and leg swelling.  Gastrointestinal:  Negative for abdominal pain, blood in stool and nausea.  Genitourinary:  Negative for dysuria  and frequency.  Musculoskeletal:  Positive for back pain, joint pain, myalgias and neck pain. Negative for falls.  Skin:  Negative for rash.  Neurological:  Positive for focal weakness. Negative for dizziness, loss of consciousness and headaches.  Endo/Heme/Allergies:  Negative for environmental allergies.  Psychiatric/Behavioral:  Negative for depression. The patient is nervous/anxious.        Objective:    Physical Exam Vitals reviewed.  Constitutional:      Appearance: Normal appearance. He is not ill-appearing.  HENT:     Head: Normocephalic and atraumatic.     Nose: Nose normal.  Eyes:     Conjunctiva/sclera: Conjunctivae normal.  Cardiovascular:     Rate and Rhythm: Normal rate.     Pulses: Normal pulses.     Heart sounds: Normal heart sounds. No murmur heard. Pulmonary:     Effort: Pulmonary effort is normal.     Breath sounds: Normal breath sounds. No wheezing.  Abdominal:     Palpations: Abdomen is soft. There is no mass.     Tenderness: There is no abdominal tenderness.  Musculoskeletal:     Cervical back: Normal range of motion.     Right lower leg: No edema.     Left lower leg: No edema.  Skin:    General: Skin is warm and dry.  Neurological:     General: No focal deficit present.     Mental Status: He is alert and oriented to  person, place, and time.  Psychiatric:        Mood and Affect: Mood normal.    There were no vitals taken for this visit. Wt Readings from Last 3 Encounters:  02/11/24 220 lb (99.8 kg)  12/05/23 230 lb (104.3 kg)  11/11/23 228 lb 3.2 oz (103.5 kg)    Diabetic Foot Exam - Simple   No data filed    Lab Results  Component Value Date   WBC 8.8 02/11/2024   HGB 14.9 02/11/2024   HCT 45.1 02/11/2024   PLT 199 02/11/2024   GLUCOSE 122 (H) 02/11/2024   CHOL 137 02/05/2024   TRIG 87 02/05/2024   HDL 52 02/05/2024   LDLCALC 68 02/05/2024   ALT 38 02/05/2024   AST 31 02/05/2024   NA 137 02/11/2024   K 3.3 (L) 02/11/2024   CL 99 02/11/2024   CREATININE 0.76 02/11/2024   BUN 15 02/11/2024   CO2 24 02/11/2024   TSH 2.74 08/18/2023   PSA 1.57 08/30/2020   HGBA1C 5.8 08/18/2023    Lab Results  Component Value Date   TSH 2.74 08/18/2023   Lab Results  Component Value Date   WBC 8.8 02/11/2024   HGB 14.9 02/11/2024   HCT 45.1 02/11/2024   MCV 94.2 02/11/2024   PLT 199 02/11/2024   Lab Results  Component Value Date   NA 137 02/11/2024   K 3.3 (L) 02/11/2024   CO2 24 02/11/2024   GLUCOSE 122 (H) 02/11/2024   BUN 15 02/11/2024   CREATININE 0.76 02/11/2024   BILITOT 1.0 02/05/2024   ALKPHOS 70 02/05/2024   AST 31 02/05/2024   ALT 38 02/05/2024   PROT 7.6 02/05/2024   ALBUMIN 3.9 02/05/2024   CALCIUM  10.3 02/11/2024   ANIONGAP 14 02/11/2024   EGFR 89 11/22/2022   GFR 82.31 08/18/2023   Lab Results  Component Value Date   CHOL 137 02/05/2024   Lab Results  Component Value Date   HDL 52 02/05/2024   Lab Results  Component Value  Date   LDLCALC 68 02/05/2024   Lab Results  Component Value Date   TRIG 87 02/05/2024   Lab Results  Component Value Date   CHOLHDL 2.6 02/05/2024   Lab Results  Component Value Date   HGBA1C 5.8 08/18/2023       Assessment & Plan:  Atrial fibrillation, unspecified type [facility]) Assessment & Plan: Tolerating current meds  and rate controlled   Benign essential HTN Assessment & Plan: Monitor and report any concerns, no changes to meds. Encouraged heart healthy diet such as the DASH diet and exercise as tolerated.    Hyperglycemia Assessment & Plan: hgba1c acceptable, minimize simple carbs. Increase exercise as tolerated.    Hyperlipidemia, mild Assessment & Plan: Encourage heart healthy diet such as MIND or DASH diet, increase exercise, avoid trans fats, simple carbohydrates and processed foods, consider a krill or fish or flaxseed oil cap daily.    Hypertension, unspecified type Assessment & Plan: Well controlled, no changes to meds. Encouraged heart healthy diet such as the DASH diet and exercise as tolerated.     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 Initial conservative management was delayed, leading to persistent foot drop. MRI confirmed a bulged disc with moderate to severe stenosis. Physical therapy has improved balance and proprioception, but foot drop persists. No current pain, suggesting possible nerve damage. He opted against orthosis, using a strap for support in stressful areas. - Continue physical therapy for balance and proprioception. - Consider orthosis for foot drop if needed in stressful areas. - Perform daily exercises to strengthen foot and ankle muscles, including alphabet exercises. - Use [name] electrical stimulation for muscle activation.  Cervical spondylosis Intermittent neck pain likely due to cervical spondylosis. Pain resolved with heat application, suggesting musculoskeletal origin. No significant findings on recent CT angiogram. He uses heat and topical rubs for relief and performs daily neck stretching exercises. - Apply heat to the neck as needed for pain  relief. - Use topical rubs like Aspercreme or Biofreeze for muscle spasm relief. - Perform daily neck stretching exercises to prevent stiffness. - Consider physical therapy if neck pain becomes recurrent.  Benign essential hypertension Blood pressure is currently well-controlled.  Hyperglycemia Previous hemoglobin A1c was stable and not concerning. - Re-evaluate hemoglobin A1c after the holidays in four months.  Hyperlipidemia  General Health Maintenance Discussed the importance of vaccinations and preventive care. Encouraged to complete outstanding vaccinations. - Administer second shingles vaccine (Shingrix). - Administer tetanus vaccine as it is past the ten-year mark. - Administer Prevnar 20 for pneumococcal protection. - Continue annual COVID and flu vaccinations.  Recording duration: 32 minutes     Harlene Horton, MD

## 2024-02-29 NOTE — Assessment & Plan Note (Signed)
 [id number] acceptable, minimize simple carbs. Increase exercise as tolerated.

## 2024-02-29 NOTE — Assessment & Plan Note (Signed)
Using CPAP 

## 2024-03-03 DIAGNOSIS — L570 Actinic keratosis: Secondary | ICD-10-CM | POA: Diagnosis not present

## 2024-03-03 DIAGNOSIS — L910 Hypertrophic scar: Secondary | ICD-10-CM | POA: Diagnosis not present

## 2024-03-03 DIAGNOSIS — L821 Other seborrheic keratosis: Secondary | ICD-10-CM | POA: Diagnosis not present

## 2024-03-03 DIAGNOSIS — D225 Melanocytic nevi of trunk: Secondary | ICD-10-CM | POA: Diagnosis not present

## 2024-03-03 DIAGNOSIS — Z85828 Personal history of other malignant neoplasm of skin: Secondary | ICD-10-CM | POA: Diagnosis not present

## 2024-03-04 ENCOUNTER — Encounter: Payer: Self-pay | Admitting: Family Medicine

## 2024-03-04 ENCOUNTER — Ambulatory Visit: Admitting: Family Medicine

## 2024-03-04 VITALS — BP 120/72 | HR 80 | Temp 98.2°F | Resp 12 | Ht 68.5 in | Wt 231.6 lb

## 2024-03-04 DIAGNOSIS — I10 Essential (primary) hypertension: Secondary | ICD-10-CM | POA: Diagnosis not present

## 2024-03-04 DIAGNOSIS — I4891 Unspecified atrial fibrillation: Secondary | ICD-10-CM | POA: Diagnosis not present

## 2024-03-04 DIAGNOSIS — E785 Hyperlipidemia, unspecified: Secondary | ICD-10-CM | POA: Diagnosis not present

## 2024-03-04 DIAGNOSIS — Z23 Encounter for immunization: Secondary | ICD-10-CM | POA: Diagnosis not present

## 2024-03-04 DIAGNOSIS — G4733 Obstructive sleep apnea (adult) (pediatric): Secondary | ICD-10-CM

## 2024-03-04 DIAGNOSIS — R739 Hyperglycemia, unspecified: Secondary | ICD-10-CM | POA: Diagnosis not present

## 2024-03-04 NOTE — Patient Instructions (Addendum)
 Tetanus is due Prevnar 20 Annual covid and flu vaccine Second shingrix shot

## 2024-03-05 DIAGNOSIS — G4733 Obstructive sleep apnea (adult) (pediatric): Secondary | ICD-10-CM | POA: Diagnosis not present

## 2024-03-07 ENCOUNTER — Encounter: Payer: Self-pay | Admitting: Family Medicine

## 2024-03-08 ENCOUNTER — Emergency Department (HOSPITAL_BASED_OUTPATIENT_CLINIC_OR_DEPARTMENT_OTHER)
Admission: EM | Admit: 2024-03-08 | Discharge: 2024-03-08 | Disposition: A | Discharge status: Home / Self Care | Attending: Emergency Medicine | Admitting: Emergency Medicine

## 2024-03-08 ENCOUNTER — Encounter (HOSPITAL_BASED_OUTPATIENT_CLINIC_OR_DEPARTMENT_OTHER): Payer: Self-pay

## 2024-03-08 ENCOUNTER — Emergency Department (HOSPITAL_BASED_OUTPATIENT_CLINIC_OR_DEPARTMENT_OTHER): Admitting: Radiology

## 2024-03-08 ENCOUNTER — Other Ambulatory Visit: Payer: Self-pay

## 2024-03-08 ENCOUNTER — Ambulatory Visit (HOSPITAL_BASED_OUTPATIENT_CLINIC_OR_DEPARTMENT_OTHER): Admitting: Physical Therapy

## 2024-03-08 DIAGNOSIS — M5126 Other intervertebral disc displacement, lumbar region: Secondary | ICD-10-CM | POA: Diagnosis not present

## 2024-03-08 DIAGNOSIS — M47816 Spondylosis without myelopathy or radiculopathy, lumbar region: Secondary | ICD-10-CM | POA: Diagnosis not present

## 2024-03-08 DIAGNOSIS — M545 Low back pain, unspecified: Secondary | ICD-10-CM | POA: Insufficient documentation

## 2024-03-08 DIAGNOSIS — Z7901 Long term (current) use of anticoagulants: Secondary | ICD-10-CM | POA: Insufficient documentation

## 2024-03-08 DIAGNOSIS — M5459 Other low back pain: Secondary | ICD-10-CM | POA: Diagnosis not present

## 2024-03-08 DIAGNOSIS — Z043 Encounter for examination and observation following other accident: Secondary | ICD-10-CM | POA: Diagnosis not present

## 2024-03-08 DIAGNOSIS — I700 Atherosclerosis of aorta: Secondary | ICD-10-CM | POA: Diagnosis not present

## 2024-03-08 MED ORDER — OXYCODONE-ACETAMINOPHEN 5-325 MG PO TABS: 1.0000 | ORAL_TABLET | Freq: Four times a day (QID) | ORAL | 0 refills | Status: AC | PRN

## 2024-03-08 MED ORDER — OXYCODONE-ACETAMINOPHEN 5-325 MG PO TABS
1.0000 | ORAL_TABLET | Freq: Once | ORAL | Status: AC
  Administered 2024-03-08: 1 via ORAL
  Filled 2024-03-08: qty 1

## 2024-03-08 NOTE — ED Triage Notes (Signed)
 Pt was walking last night at approx 1900, and fell backwards landing on a small raised area in the floor. He landed on his lower back. Pt has history of foot drop and thinks this may be reason he fell. Denies LOC.

## 2024-03-08 NOTE — Discharge Instructions (Signed)
 You were seen today for a fall.  Your x-rays do not show any evidence of fracture.  Take medication as prescribed.  Follow-up with your orthopedist.

## 2024-03-08 NOTE — ED Provider Notes (Signed)
 [place] EMERGENCY DEPARTMENT AT [facility] Provider Note   CSN: [id number] Arrival date & time: 03/08/24  0518     Patient presents with: Christian [name] is a 78 y.o. male.   HPI     This is a 78 year old male with recent history of lumbar radiculopathy and foot drop who is currently in physical therapy who presents with back pain.  Patient reports that he fell backward striking the mid part of his lower back on the threshold.  He states that since that time he has had increasing pain.  He states that this pain is different from his known herniated disc.  Denies any new weakness, numbness, worsening of foot drop.  Denies any bowel or bladder difficulty.  Took Tylenol  at home with minimal relief.  Did not hit his head or lose consciousness.  He is on Eliquis   Prior to Admission medications   Medication Sig Start Date End Date Taking? Authorizing Provider  oxyCODONE -acetaminophen  (PERCOCET/ROXICET) 5-325 MG tablet Take 1 tablet by mouth every 6 (six) hours as needed for severe pain (pain score 7-10). 03/08/24  Yes Toluwani Ruder, Christian FALCON, MD  amLODipine  (NORVASC ) 2.5 MG tablet Take 1 tablet (2.5 mg total) by mouth daily. 01/07/24 04/06/24  Rolan Ezra RAMAN, MD  dapagliflozin  propanediol (FARXIGA ) 10 MG TABS tablet TAKE 1 TABLET BY MOUTH EVERY DAY BEFORE BREAKFAST 11/26/23   Rolan Ezra RAMAN, MD  ELIQUIS  5 MG TABS tablet TAKE 1 TABLET BY MOUTH TWICE A DAY 09/02/23   Rolan Ezra RAMAN, MD  hydrochlorothiazide  (HYDRODIURIL ) 25 MG tablet TAKE 1 TABLET (25 MG TOTAL) BY MOUTH DAILY. 03/01/24 05/30/24  Rolan Ezra RAMAN, MD  losartan  (COZAAR ) 100 MG tablet TAKE 1 TABLET BY MOUTH EVERY DAY 12/10/23   Domenica Harlene DELENA, MD  Multiple Vitamins-Minerals (PRESERVISION AREDS 2 PO) Take 1 capsule by mouth 2 (two) times daily.    [provider]  potassium chloride  SA (KLOR-CON  M) 20 MEQ tablet TAKE 1 TABLET BY MOUTH EVERY DAY 12/29/23   Rolan Ezra RAMAN, MD  rosuvastatin  (CRESTOR ) 40 MG  tablet Take 1 tablet (40 mg total) by mouth daily. 12/05/23   Rolan Ezra RAMAN, MD    Allergies: Codeine    Review of Systems  Constitutional:  Negative for fever.  Respiratory:  Negative for shortness of breath.   Cardiovascular:  Negative for chest pain.  Genitourinary:  Negative for difficulty urinating.  Musculoskeletal:  Positive for back pain.  All other systems reviewed and are negative.   Updated Vital Signs BP (!) 182/85   Pulse 91   Temp 98.4 F (36.9 C) (Oral)   Resp 18   SpO2 96%   Physical Exam Vitals and nursing note reviewed.  Constitutional:      Appearance: He is well-developed. He is obese. He is not ill-appearing.  HENT:     Head: Normocephalic and atraumatic.  Eyes:     Pupils: Pupils are equal, round, and reactive to light.  Cardiovascular:     Rate and Rhythm: Normal rate and regular rhythm.  Pulmonary:     Effort: Pulmonary effort is normal. No respiratory distress.  Abdominal:     Tenderness: There is no abdominal tenderness.  Musculoskeletal:     Cervical back: No rigidity.     Comments: Tenderness to palpation midline lumbar spine, no midline step-off deformity noted  Lymphadenopathy:     Cervical: No cervical adenopathy.  Skin:    General: Skin is warm and dry.  Comments: No bruising noted  Neurological:     Mental Status: He is alert and oriented to person, place, and time.     Comments: 5 out of 5 strength with knee extension and flexion bilaterally, patient appears to be able to dorsiflex and plantarflex bilateral feet maybe with some subtle weakness on the left with dorsiflexion, patellar reflexes intact bilaterally  Psychiatric:        Mood and Affect: Mood normal.     (all labs ordered are listed, but only abnormal results are displayed) Labs Reviewed - No data to display  EKG: None  Radiology: DG Lumbar Spine Complete Result Date: 03/08/2024 EXAM: 4 or more VIEW(S) XRAY OF THE LUMBAR SPINE 03/08/2024 06:07:12 AM  COMPARISON: Lumbar radiographs 09/10/2023. CLINICAL HISTORY: 78 year old male. Fall with landing on lower back. History of foot drop. Denies LOC. FINDINGS: LUMBAR SPINE: BONES: Normal lumbar segmentation. Stable lumbar lordosis. Stable mild degenerative retrolisthesis of L2 on L3. No acute fracture. No aggressive appearing osseous lesion. No pars fracture. DISCS AND DEGENERATIVE CHANGES: Widespread disc space loss and endplate spurring in the lumbar spine. Endplate spurring in the lower thoracic spine which might result in interbody ankylosis. Moderate to severe lower lumbar facet hypertrophy is chronic from L3 to S1. SOFT TISSUES: Calcified aortic atherosclerosis. Negative visible bowel gas and lung bases. No acute abnormality. IMPRESSION: 1. No acute osseous abnormality identified in the lumbar spine. 2. Stable chronic lumbar degeneration. 3. Calcified aortic atherosclerosis. Electronically signed by: Helayne Hurst MD 03/08/2024 06:22 AM EDT RP Workstation: [id number]     Procedures   Medications Ordered in the ED  oxyCODONE -acetaminophen  (PERCOCET/ROXICET) 5-325 MG per tablet 1 tablet (1 tablet Oral Given 03/08/24 0545)                                    Medical Decision Making Amount and/or Complexity of Data Reviewed Radiology: ordered.  Risk Prescription drug management.   This patient presents to the ED for concern of fall, this involves an extensive number of treatment options, and is a complaint that carries with it a high risk of complications and morbidity.  I considered the following differential and admission for this acute, potentially life threatening condition.  The differential diagnosis includes contusion, fracture, worsening sciatica or herniated disc  MDM:    This is a 78 year old male who presents after mechanical fall.  He fell backward.  He struck his back on a threshold.  Nontoxic and vital signs are notable for blood pressure of 182/85.  He has some tenderness of the  lumbar spine.  Appears to be neurologically at baseline based on this recent Ortho notes and physical therapy notes.  X-rays do not show any evidence of acute fracture.  There is no significant contusion across the back.  Patient is not a good candidate for NSAIDs.  Will treat with a short course of Percocet for pain.  Discussed with him that he needs to follow-up closely with his orthopedist if you develop any new or worsening symptoms may need more advanced imaging.  Patient stated understanding.  (Labs, imaging, consults)  Labs: I Ordered, and personally interpreted labs.  The pertinent results include: None  Imaging Studies ordered: I ordered imaging studies including lumbar x-ray I independently visualized and interpreted imaging. I agree with the radiologist interpretation  Additional history obtained from chart review.  External records from outside source obtained and reviewed including Ortho and  physical therapy notes  Cardiac Monitoring: The patient was not maintained on a cardiac monitor.  If on the cardiac monitor, I personally viewed and interpreted the cardiac monitored which showed an underlying rhythm of: N/A  Reevaluation: After the interventions noted above, I reevaluated the patient and found that they have :stayed the same  Social Determinants of Health:  lives independently  Disposition: Discharge  Co morbidities that complicate the patient evaluation  Past Medical History:  Diagnosis Date   Back pain 06/04/2015   DVT of lower limb, acute (HCC) 01/13/2015   High blood pressure    High cholesterol    History of chicken pox 01/13/2015   Hyperlipidemia, mild 01/13/2015   Hypokalemia 01/13/2015   Obesity    Sleep apnea 1985   Verrucous skin lesion 02/08/2016     Medicines Meds ordered this encounter  Medications   oxyCODONE -acetaminophen  (PERCOCET/ROXICET) 5-325 MG per tablet 1 tablet    Refill:  0   oxyCODONE -acetaminophen  (PERCOCET/ROXICET) 5-325 MG  tablet    Sig: Take 1 tablet by mouth every 6 (six) hours as needed for severe pain (pain score 7-10).    Dispense:  10 tablet    Refill:  0    I have reviewed the patients home medicines and have made adjustments as needed  Problem List / ED Course: Problem List Items Addressed This Visit       Other   Low back pain - Primary   Relevant Medications   oxyCODONE -acetaminophen  (PERCOCET/ROXICET) 5-325 MG tablet             Final diagnoses:  Acute midline low back pain without sciatica    ED Discharge Orders          Ordered    oxyCODONE -acetaminophen  (PERCOCET/ROXICET) 5-325 MG tablet  Every 6 hours PRN        03/08/24 0634               Bari Christian FALCON, MD 03/08/24 [phone]

## 2024-03-19 ENCOUNTER — Telehealth: Payer: Self-pay

## 2024-03-19 ENCOUNTER — Ambulatory Visit: Payer: Self-pay

## 2024-03-19 NOTE — Telephone Encounter (Signed)
 Patient returned call and stated that he is already scheduled for 11/11 with Dr Antonio Meth for his back pain. Patient would like to  keep that appointment.

## 2024-03-19 NOTE — Telephone Encounter (Signed)
 Needs ED f/u please w/ Yacopino

## 2024-03-19 NOTE — Telephone Encounter (Signed)
 Copied from CRM [id number]. Topic: Clinical - Request for Lab/Test Order >> Mar 19, 2024  3:57 PM Pinkey ORN wrote: Reason for CRM: Requesting MRI >> Mar 19, 2024  3:59 PM Pinkey ORN wrote: Patient states he experienced a fall about 2 weeks ago. Although the hospital scans came back, stating that there was no tear / damages to the spine. Patient is requesting to have an MRI completed, states he hasn't been able to sleep in his bed since it happened, he's been sleeping in a recliner chair.

## 2024-03-19 NOTE — Telephone Encounter (Signed)
 FYI Only or Action Required?: FYI only for provider: appointment scheduled on 03/23/2024.  Patient was last seen in primary care on 03/04/2024 by Domenica Harlene LABOR, MD.  Called Nurse Triage reporting Back Pain.  Symptoms began several weeks ago.  Interventions attempted: Rest, hydration, or home remedies.  Symptoms are: unchanged.  Triage Disposition: See PCP When Office is Open (Within 3 Days)  Patient/caregiver understands and will follow disposition?: Yes  Copied from CRM #[id number]. Topic: Clinical - Red Word Triage >> Mar 19, 2024  4:00 PM Pinkey ORN wrote: Red Word that prompted transfer to Nurse Triage: Fall >> Mar 19, 2024  4:00 PM Pinkey ORN wrote: Patient states he experienced a fall about 2 weeks ago and been experiencing some back pain and discomfort.  Reason for Disposition  [1] MODERATE back pain (e.g., interferes with normal activities) AND [2] present > 3 days  Answer Assessment - Initial Assessment Questions Injury of back in May that resulted in drop foot Has been improving and going to PT Stumbled and fell backwards about two weeks ago Santina to ED and ruled out injury Reports that pain has resolved for the most part, but he is unable to lie down on his back in bed.  He is now concerned that something else may be going on that is not visible on XR He would like to know if an MRI should be considered  Protocols used: Back Pain-A-AH

## 2024-03-19 NOTE — Telephone Encounter (Signed)
 Appt scheduled

## 2024-03-19 NOTE — Telephone Encounter (Signed)
 Lvm to sched

## 2024-03-22 ENCOUNTER — Encounter (HOSPITAL_BASED_OUTPATIENT_CLINIC_OR_DEPARTMENT_OTHER): Payer: Self-pay

## 2024-03-22 ENCOUNTER — Ambulatory Visit (HOSPITAL_BASED_OUTPATIENT_CLINIC_OR_DEPARTMENT_OTHER): Admitting: Physical Therapy

## 2024-03-23 ENCOUNTER — Encounter: Payer: Self-pay | Admitting: Family Medicine

## 2024-03-23 ENCOUNTER — Ambulatory Visit (INDEPENDENT_AMBULATORY_CARE_PROVIDER_SITE_OTHER): Admitting: Family Medicine

## 2024-03-23 VITALS — BP 138/70 | HR 75 | Temp 98.2°F | Resp 18 | Ht 68.5 in | Wt 229.4 lb

## 2024-03-23 DIAGNOSIS — M545 Low back pain, unspecified: Secondary | ICD-10-CM

## 2024-03-23 NOTE — Progress Notes (Signed)
 Subjective:    Patient ID: Christian [name], male    DOB: 1945-06-18, 78 y.o.   MRN: [id number]  Chief Complaint  Patient presents with   Back Pain    Pt states sxs having been going on since the end of Sept. Pt states sxs have improved. Pain was lower back. Pt states having a fall and was seen in the Ed at [facility]     HPI Patient is in today for back pain.  Discussed the use of AI scribe software for clinical note transcription with the patient, who gave verbal consent to proceed.  History of Present Illness Christian [name] is a 78 year old male who presents with back pain following a fall.  Approximately eleven days ago, he stumbled and fell backwards, landing on his shoulders and then his buttocks, hitting a threshold with his back. He experienced significant pain following the fall, prompting him to seek care at [facility] where x-rays were performed, revealing no fractures or significant injuries. Despite this, he continued to experience severe pain, particularly when attempting to lie down in bed, leading him to sleep in a recliner for comfort. Recently, he attempted to sleep in his bed again and noted a significant reduction in pain, describing it as 'very little pain anymore'.  The pain is now localized to one side and has mostly resolved, allowing him to perform daily activities such as sitting, walking, bending, and getting in and out of a car with minimal discomfort. He notes that bending deeply might cause some pulling sensation. He has not been taking pain medication regularly, only occasionally using Tylenol , and has stopped using oxycodone  due to concerns about impaired walking.  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 He is currently undergoing physical therapy at [facility] for this condition, as well as for drop foot, which has been ongoing for eight to ten weeks. He reports improvement in his walking and  balance, attributing this to the therapy.  He is not currently experiencing any new or worsening symptoms related to his previous back condition, and he is able to walk on his toes and navigate stairs without difficulty. He is continuing with physical therapy, which focuses on exercises to improve balance and foot lifting.    Past Medical History:  Diagnosis Date   Back pain 06/04/2015   DVT of lower limb, acute (HCC) 01/13/2015   High blood pressure    High cholesterol    History of chicken pox 01/13/2015   Hyperlipidemia, mild 01/13/2015   Hypokalemia 01/13/2015   Obesity    Sleep apnea 1985   Verrucous skin lesion 02/08/2016    Past Surgical History:  Procedure Laterality Date   CARDIOVERSION N/A 09/30/2022   Procedure: CARDIOVERSION;  Surgeon: Rolan Ezra RAMAN, MD;  Location: [facility] INVASIVE CV LAB;  Service: Cardiovascular;  Laterality: N/A;   EYE SURGERY Bilateral 02/2019   Dr Lavonia   RIGHT HEART CATH N/A 12/04/2021   Procedure: RIGHT HEART CATH;  Surgeon: Rolan Ezra RAMAN, MD;  Location: [facility] INVASIVE CV LAB;  Service: Cardiovascular;  Laterality: N/A;    Family History  Problem Relation Age of Onset   Stroke Father        from surgery   Emphysema Father        smoker   Heart disease Father        carotid artery disease, vasculopathy   Allergic Disorder Sister  Social History   Socioeconomic History   Marital status: Married    Spouse name: Not on file   Number of children: 1   Years of education: Not on file   Highest education level: Associate degree: academic program  Occupational History   Occupation: Retired  Tobacco Use   Smoking status: Never   Smokeless tobacco: Never  Substance and Sexual Activity   Alcohol use: Yes    Alcohol/week: 0.0 standard drinks of alcohol    Comment: 2-3 glasses of wine every night   Drug use: No   Sexual activity: Yes    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, retired from english as a second language teacher, no dietary restrictions  Other  Topics Concern   Not on file  Social History Narrative   Not on file   Social Drivers of Health   Financial Resource Strain: Low Risk  (02/26/2024)   Overall Financial Resource Strain (CARDIA)    Difficulty of Paying Living Expenses: Not hard at all  Food Insecurity: No Food Insecurity (02/26/2024)   Hunger Vital Sign    Worried About Running Out of Food in the Last Year: Never true    Ran Out of Food in the Last Year: Never true  Transportation Needs: No Transportation Needs (02/26/2024)   PRAPARE - Administrator, Civil Service (Medical): No    Lack of Transportation (Non-Medical): No  Physical Activity: Insufficiently Active (02/26/2024)   Exercise Vital Sign    Days of Exercise per Week: 4 days    Minutes of Exercise per Session: 20 min  Stress: No Stress Concern Present (02/26/2024)   [employer] of Occupational Health - Occupational Stress Questionnaire    Feeling of Stress: Not at all  Social Connections: Moderately Isolated (02/26/2024)   Social Connection and Isolation Panel    Frequency of Communication with Friends and Family: More than three times a week    Frequency of Social Gatherings with Friends and Family: Once a week    Attends Religious Services: Never    Database Administrator or Organizations: No    Attends Engineer, Structural: Not on file    Marital Status: Married  Catering Manager Violence: Not on file    Outpatient Medications Prior to Visit  Medication Sig Dispense Refill   amLODipine  (NORVASC ) 2.5 MG tablet Take 1 tablet (2.5 mg total) by mouth daily. 90 tablet 3   dapagliflozin  propanediol (FARXIGA ) 10 MG TABS tablet TAKE 1 TABLET BY MOUTH EVERY DAY BEFORE BREAKFAST 90 tablet 3   ELIQUIS  5 MG TABS tablet TAKE 1 TABLET BY MOUTH TWICE A DAY 60 tablet 11   hydrochlorothiazide  (HYDRODIURIL ) 25 MG tablet TAKE 1 TABLET (25 MG TOTAL) BY MOUTH DAILY. 90 tablet 3   losartan  (COZAAR ) 100 MG tablet TAKE 1 TABLET BY MOUTH EVERY DAY  90 tablet 1   Multiple Vitamins-Minerals (PRESERVISION AREDS 2 PO) Take 1 capsule by mouth 2 (two) times daily.     oxyCODONE -acetaminophen  (PERCOCET/ROXICET) 5-325 MG tablet Take 1 tablet by mouth every 6 (six) hours as needed for severe pain (pain score 7-10). 10 tablet 0   potassium chloride  SA (KLOR-CON  M) 20 MEQ tablet TAKE 1 TABLET BY MOUTH EVERY DAY 90 tablet 3   rosuvastatin  (CRESTOR ) 40 MG tablet Take 1 tablet (40 mg total) by mouth daily. 90 tablet 3   No facility-administered medications prior to visit.    Allergies  Allergen Reactions   Codeine Anaphylaxis    Review of Systems  Constitutional:  Negative for fever and malaise/fatigue.  HENT:  Negative for congestion.   Eyes:  Negative for blurred vision.  Respiratory:  Negative for shortness of breath.   Cardiovascular:  Negative for chest pain, palpitations and leg swelling.  Gastrointestinal:  Negative for abdominal pain, blood in stool and nausea.  Genitourinary:  Negative for dysuria and frequency.  Musculoskeletal:  Positive for back pain. Negative for falls.  Skin:  Negative for rash.  Neurological:  Negative for dizziness, loss of consciousness and headaches.  Endo/Heme/Allergies:  Negative for environmental allergies.  Psychiatric/Behavioral:  Negative for depression. The patient is not nervous/anxious.        Objective:    Physical Exam  BP 138/70 (BP Location: Left Arm, Patient Position: Sitting, Cuff Size: Large)   Pulse 75   Temp 98.2 F (36.8 C) (Oral)   Resp 18   Ht 5' 8.5 (1.74 m)   Wt 229 lb 6.4 oz (104.1 kg)   SpO2 96%   BMI 34.37 kg/m  Wt Readings from Last 3 Encounters:  03/23/24 229 lb 6.4 oz (104.1 kg)  03/04/24 231 lb 9.6 oz (105.1 kg)  02/11/24 220 lb (99.8 kg)    Diabetic Foot Exam - Simple   No data filed    Lab Results  Component Value Date   WBC 8.8 02/11/2024   HGB 14.9 02/11/2024   HCT 45.1 02/11/2024   PLT 199 02/11/2024   GLUCOSE 122 (H) 02/11/2024   CHOL 137  02/05/2024   TRIG 87 02/05/2024   HDL 52 02/05/2024   LDLCALC 68 02/05/2024   ALT 38 02/05/2024   AST 31 02/05/2024   NA 137 02/11/2024   K 3.3 (L) 02/11/2024   CL 99 02/11/2024   CREATININE 0.76 02/11/2024   BUN 15 02/11/2024   CO2 24 02/11/2024   TSH 2.74 08/18/2023   PSA 1.57 08/30/2020   HGBA1C 5.8 08/18/2023    Lab Results  Component Value Date   TSH 2.74 08/18/2023   Lab Results  Component Value Date   WBC 8.8 02/11/2024   HGB 14.9 02/11/2024   HCT 45.1 02/11/2024   MCV 94.2 02/11/2024   PLT 199 02/11/2024   Lab Results  Component Value Date   NA 137 02/11/2024   K 3.3 (L) 02/11/2024   CO2 24 02/11/2024   GLUCOSE 122 (H) 02/11/2024   BUN 15 02/11/2024   CREATININE 0.76 02/11/2024   BILITOT 1.0 02/05/2024   ALKPHOS 70 02/05/2024   AST 31 02/05/2024   ALT 38 02/05/2024   PROT 7.6 02/05/2024   ALBUMIN 3.9 02/05/2024   CALCIUM  10.3 02/11/2024   ANIONGAP 14 02/11/2024   EGFR 89 11/22/2022   GFR 82.31 08/18/2023   Lab Results  Component Value Date   CHOL 137 02/05/2024   Lab Results  Component Value Date   HDL 52 02/05/2024   Lab Results  Component Value Date   LDLCALC 68 02/05/2024   Lab Results  Component Value Date   TRIG 87 02/05/2024   Lab Results  Component Value Date   CHOLHDL 2.6 02/05/2024   Lab Results  Component Value Date   HGBA1C 5.8 08/18/2023       Assessment & Plan:  Acute bilateral low back pain without sciatica   Assessment and Plan Assessment & Plan Back pain after fall, improving   Back pain from a fall 11 days ago was initially severe but has significantly improved. Pain is localized to one side and minimal with current activity. Initial x-rays  showed no fractures, suggesting a muscular cause. No spasms are reported. Pain is managed with occasional Tylenol , and he has stopped using oxycodone  due to improvement and concerns about impaired walking. Continue physical therapy for back pain management and use Tylenol  as  needed.  Lumbar disc bulge with drop foot, in physical therapy   He has a lumbar disc bulge with associated drop foot and is undergoing physical therapy for 8-10 weeks. Leg strength is good, and there is no worsening of the disc condition. Physical therapy focuses on balance and foot lifting, showing improvement. No need for muscle relaxers or additional interventions. Continue physical therapy for lumbar disc bulge and drop foot. Ensure the physical therapist can request an extension of therapy if needed. Assessment and Plan Assessment & Plan Back pain after fall, improving   Back pain from a fall 11 days ago was initially severe but has significantly improved. Pain is localized to one side and minimal with current activity. Initial x-rays showed no fractures, suggesting a muscular cause. No spasms are reported. Pain is managed with occasional Tylenol , and he has stopped using oxycodone  due to improvement and concerns about impaired walking. Continue physical therapy for back pain management and use Tylenol  as needed.  Lumbar disc bulge with drop foot, in physical therapy   He has a lumbar disc bulge with associated drop foot and is undergoing physical therapy for 8-10 weeks. Leg strength is good, and there is no worsening of the disc condition. Physical therapy focuses on balance and foot lifting, showing improvement. No need for muscle relaxers or additional interventions. Continue physical therapy for lumbar disc bulge and drop foot. Ensure the physical therapist can request an extension of therapy if needed.    Wladyslaw Henrichs R Lowne Chase, DO

## 2024-04-24 ENCOUNTER — Encounter: Payer: Self-pay | Admitting: Family Medicine

## 2024-04-26 ENCOUNTER — Other Ambulatory Visit: Payer: Self-pay | Admitting: Family

## 2024-04-26 ENCOUNTER — Other Ambulatory Visit: Payer: Self-pay | Admitting: Family Medicine

## 2024-04-26 DIAGNOSIS — W19XXXD Unspecified fall, subsequent encounter: Secondary | ICD-10-CM

## 2024-04-26 DIAGNOSIS — M5440 Lumbago with sciatica, unspecified side: Secondary | ICD-10-CM

## 2024-04-26 MED ORDER — METHYLPREDNISOLONE 4 MG PO TBPK: ORAL_TABLET | ORAL | 0 refills | Status: AC

## 2024-04-27 ENCOUNTER — Ambulatory Visit (HOSPITAL_BASED_OUTPATIENT_CLINIC_OR_DEPARTMENT_OTHER)
Admission: RE | Admit: 2024-04-27 | Discharge: 2024-04-27 | Disposition: A | Source: Ambulatory Visit | Attending: Family Medicine | Admitting: Family Medicine

## 2024-04-27 DIAGNOSIS — W19XXXD Unspecified fall, subsequent encounter: Secondary | ICD-10-CM | POA: Insufficient documentation

## 2024-04-27 DIAGNOSIS — M5440 Lumbago with sciatica, unspecified side: Secondary | ICD-10-CM | POA: Insufficient documentation

## 2024-04-27 DIAGNOSIS — M4726 Other spondylosis with radiculopathy, lumbar region: Secondary | ICD-10-CM | POA: Insufficient documentation

## 2024-04-30 ENCOUNTER — Ambulatory Visit: Payer: Self-pay | Admitting: Family Medicine

## 2024-05-03 NOTE — Telephone Encounter (Signed)
 Patient states he will contact his PT at [facility] and schedule appointment with them. He would like to start the muscle relaxer and states he will check his bp.

## 2024-05-04 ENCOUNTER — Other Ambulatory Visit: Payer: Self-pay | Admitting: Family Medicine

## 2024-05-04 MED ORDER — TIZANIDINE HCL 2 MG PO TABS: 1.0000 mg | ORAL_TABLET | Freq: Four times a day (QID) | ORAL | 1 refills | Status: AC | PRN

## 2024-05-07 ENCOUNTER — Emergency Department (HOSPITAL_BASED_OUTPATIENT_CLINIC_OR_DEPARTMENT_OTHER)
Admission: EM | Admit: 2024-05-07 | Discharge: 2024-05-07 | Discharge status: Against Medical Advice | Attending: Emergency Medicine | Admitting: Emergency Medicine

## 2024-05-07 ENCOUNTER — Other Ambulatory Visit: Payer: Self-pay

## 2024-05-07 DIAGNOSIS — R1013 Epigastric pain: Secondary | ICD-10-CM | POA: Diagnosis present

## 2024-05-07 DIAGNOSIS — Z5321 Procedure and treatment not carried out due to patient leaving prior to being seen by health care provider: Secondary | ICD-10-CM | POA: Diagnosis not present

## 2024-05-07 DIAGNOSIS — R110 Nausea: Secondary | ICD-10-CM | POA: Insufficient documentation

## 2024-05-07 LAB — COMPREHENSIVE METABOLIC PANEL WITH GFR
ALT: 48 U/L — ABNORMAL HIGH (ref 0–44)
AST: 23 U/L (ref 15–41)
Albumin: 4.4 g/dL (ref 3.5–5.0)
Alkaline Phosphatase: 107 U/L (ref 38–126)
Anion gap: 13 (ref 5–15)
BUN: 16 mg/dL (ref 8–23)
CO2: 26 mmol/L (ref 22–32)
Calcium: 10 mg/dL (ref 8.9–10.3)
Chloride: 101 mmol/L (ref 98–111)
Creatinine, Ser: 0.76 mg/dL (ref 0.61–1.24)
GFR, Estimated: >60 mL/min
Glucose, Bld: 116 mg/dL — ABNORMAL HIGH (ref 70–99)
Potassium: 3.3 mmol/L — ABNORMAL LOW (ref 3.5–5.1)
Sodium: 139 mmol/L (ref 135–145)
Total Bilirubin: 0.5 mg/dL (ref 0.0–1.2)
Total Protein: 7.7 g/dL (ref 6.5–8.1)

## 2024-05-07 LAB — CBC
HCT: 43.9 % (ref 39.0–52.0)
Hemoglobin: 14.5 g/dL (ref 13.0–17.0)
MCH: 31.1 pg (ref 26.0–34.0)
MCHC: 33 g/dL (ref 30.0–36.0)
MCV: 94.2 fL (ref 80.0–100.0)
Platelets: 238 K/uL (ref 150–400)
RBC: 4.66 MIL/uL (ref 4.22–5.81)
RDW: 13.9 % (ref 11.5–15.5)
WBC: 9.4 K/uL (ref 4.0–10.5)
nRBC: 0 % (ref 0.0–0.2)

## 2024-05-07 LAB — LIPASE, BLOOD: Lipase: 50 U/L (ref 11–51)

## 2024-05-07 NOTE — ED Triage Notes (Signed)
 Pt states that at approx 1500 he had heartburn that has since moved to his epigastric region. Pt admits to having pizza for lunch. Pt does report some nausea, but denies vomiting/diarrhea/constipation. Pt reports symptoms are subsiding. He took Pepto bismol right before coming and is unsure if it helped.

## 2024-06-09 ENCOUNTER — Other Ambulatory Visit (HOSPITAL_COMMUNITY): Payer: Self-pay | Admitting: Cardiology

## 2024-06-09 ENCOUNTER — Other Ambulatory Visit: Payer: Self-pay | Admitting: Family Medicine

## 2024-09-09 ENCOUNTER — Encounter: Admitting: Student

## 2024-09-09 ENCOUNTER — Encounter: Admitting: Family Medicine
